# Patient Record
Sex: Female | Born: 1980 | Race: White | Hispanic: No | Smoking: Current every day smoker
Health system: Southern US, Community
[De-identification: ages and names within clinical notes are randomized; demographics above are authoritative.]

## PROBLEM LIST (undated history)

## (undated) ENCOUNTER — Inpatient Hospital Stay (HOSPITAL_COMMUNITY): Payer: Self-pay

## (undated) DIAGNOSIS — F419 Anxiety disorder, unspecified: Secondary | ICD-10-CM

## (undated) DIAGNOSIS — L309 Dermatitis, unspecified: Secondary | ICD-10-CM

## (undated) DIAGNOSIS — Z843 Family history of consanguinity: Secondary | ICD-10-CM

## (undated) DIAGNOSIS — I10 Essential (primary) hypertension: Secondary | ICD-10-CM

## (undated) DIAGNOSIS — R112 Nausea with vomiting, unspecified: Secondary | ICD-10-CM

## (undated) DIAGNOSIS — G43909 Migraine, unspecified, not intractable, without status migrainosus: Secondary | ICD-10-CM

## (undated) DIAGNOSIS — K219 Gastro-esophageal reflux disease without esophagitis: Secondary | ICD-10-CM

## (undated) DIAGNOSIS — F909 Attention-deficit hyperactivity disorder, unspecified type: Secondary | ICD-10-CM

## (undated) DIAGNOSIS — D649 Anemia, unspecified: Secondary | ICD-10-CM

## (undated) DIAGNOSIS — Z9889 Other specified postprocedural states: Secondary | ICD-10-CM

## (undated) HISTORY — DX: Essential (primary) hypertension: I10

## (undated) HISTORY — PX: SPINAL FUSION: SHX223

## (undated) HISTORY — PX: TONSILLECTOMY: SUR1361

## (undated) HISTORY — PX: OTHER SURGICAL HISTORY: SHX169

## (undated) HISTORY — DX: Attention-deficit hyperactivity disorder, unspecified type: F90.9

## (undated) HISTORY — DX: Migraine, unspecified, not intractable, without status migrainosus: G43.909

## (undated) HISTORY — DX: Family history of consanguinity: Z84.3

---
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
## 1998-01-03 ENCOUNTER — Ambulatory Visit (HOSPITAL_COMMUNITY): Admission: RE | Admit: 1998-01-03 | Discharge: 1998-01-03 | Payer: Self-pay | Admitting: Obstetrics and Gynecology

## 1998-04-10 ENCOUNTER — Ambulatory Visit (HOSPITAL_COMMUNITY): Admission: RE | Admit: 1998-04-10 | Discharge: 1998-04-10 | Payer: Self-pay | Admitting: Obstetrics and Gynecology

## 1998-04-10 ENCOUNTER — Encounter: Payer: Self-pay | Admitting: Obstetrics and Gynecology

## 1998-04-18 ENCOUNTER — Encounter: Payer: Self-pay | Admitting: Obstetrics and Gynecology

## 1998-04-18 ENCOUNTER — Ambulatory Visit (HOSPITAL_COMMUNITY): Admission: RE | Admit: 1998-04-18 | Discharge: 1998-04-18 | Payer: Self-pay | Admitting: Obstetrics and Gynecology

## 1998-04-25 ENCOUNTER — Observation Stay (HOSPITAL_COMMUNITY): Admission: AD | Admit: 1998-04-25 | Discharge: 1998-04-26 | Payer: Self-pay | Admitting: Obstetrics and Gynecology

## 1998-04-26 ENCOUNTER — Encounter: Payer: Self-pay | Admitting: Obstetrics and Gynecology

## 1998-05-11 ENCOUNTER — Ambulatory Visit (HOSPITAL_COMMUNITY): Admission: RE | Admit: 1998-05-11 | Discharge: 1998-05-11 | Payer: Self-pay | Admitting: Obstetrics and Gynecology

## 1998-07-17 ENCOUNTER — Inpatient Hospital Stay (HOSPITAL_COMMUNITY): Admission: AD | Admit: 1998-07-17 | Discharge: 1998-07-19 | Payer: Self-pay | Admitting: Obstetrics and Gynecology

## 1998-10-10 ENCOUNTER — Emergency Department (HOSPITAL_COMMUNITY): Admission: EM | Admit: 1998-10-10 | Discharge: 1998-10-10 | Payer: Self-pay | Admitting: Emergency Medicine

## 1998-11-27 ENCOUNTER — Emergency Department (HOSPITAL_COMMUNITY): Admission: EM | Admit: 1998-11-27 | Discharge: 1998-11-28 | Payer: Self-pay | Admitting: Emergency Medicine

## 1998-11-29 ENCOUNTER — Encounter: Admission: RE | Admit: 1998-11-29 | Discharge: 1998-11-29 | Payer: Self-pay | Admitting: Hematology and Oncology

## 1999-01-27 ENCOUNTER — Encounter: Payer: Self-pay | Admitting: Internal Medicine

## 1999-01-27 ENCOUNTER — Emergency Department (HOSPITAL_COMMUNITY): Admission: EM | Admit: 1999-01-27 | Discharge: 1999-01-27 | Payer: Self-pay | Admitting: Internal Medicine

## 1999-02-07 ENCOUNTER — Emergency Department (HOSPITAL_COMMUNITY): Admission: EM | Admit: 1999-02-07 | Discharge: 1999-02-07 | Payer: Self-pay | Admitting: Emergency Medicine

## 1999-02-08 ENCOUNTER — Other Ambulatory Visit: Admission: RE | Admit: 1999-02-08 | Discharge: 1999-02-08 | Payer: Self-pay | Admitting: *Deleted

## 1999-09-08 ENCOUNTER — Emergency Department (HOSPITAL_COMMUNITY): Admission: EM | Admit: 1999-09-08 | Discharge: 1999-09-09 | Payer: Self-pay

## 1999-11-13 ENCOUNTER — Emergency Department (HOSPITAL_COMMUNITY): Admission: EM | Admit: 1999-11-13 | Discharge: 1999-11-13 | Payer: Self-pay

## 2000-03-13 ENCOUNTER — Encounter: Payer: Self-pay | Admitting: Emergency Medicine

## 2000-03-13 ENCOUNTER — Emergency Department (HOSPITAL_COMMUNITY): Admission: EM | Admit: 2000-03-13 | Discharge: 2000-03-13 | Payer: Self-pay | Admitting: Emergency Medicine

## 2000-04-20 ENCOUNTER — Emergency Department (HOSPITAL_COMMUNITY): Admission: EM | Admit: 2000-04-20 | Discharge: 2000-04-20 | Payer: Self-pay | Admitting: Emergency Medicine

## 2000-04-23 ENCOUNTER — Emergency Department (HOSPITAL_COMMUNITY): Admission: EM | Admit: 2000-04-23 | Discharge: 2000-04-23 | Payer: Self-pay | Admitting: Emergency Medicine

## 2000-06-02 ENCOUNTER — Emergency Department (HOSPITAL_COMMUNITY): Admission: EM | Admit: 2000-06-02 | Discharge: 2000-06-02 | Payer: Self-pay | Admitting: Emergency Medicine

## 2001-01-14 ENCOUNTER — Other Ambulatory Visit: Admission: RE | Admit: 2001-01-14 | Discharge: 2001-01-14 | Payer: Self-pay | Admitting: Family Medicine

## 2001-08-17 ENCOUNTER — Encounter: Payer: Self-pay | Admitting: Emergency Medicine

## 2001-08-17 ENCOUNTER — Emergency Department (HOSPITAL_COMMUNITY): Admission: EM | Admit: 2001-08-17 | Discharge: 2001-08-17 | Payer: Self-pay | Admitting: Emergency Medicine

## 2001-09-13 ENCOUNTER — Emergency Department (HOSPITAL_COMMUNITY): Admission: EM | Admit: 2001-09-13 | Discharge: 2001-09-14 | Payer: Self-pay

## 2002-03-29 ENCOUNTER — Emergency Department (HOSPITAL_COMMUNITY): Admission: EM | Admit: 2002-03-29 | Discharge: 2002-03-29 | Payer: Self-pay | Admitting: Emergency Medicine

## 2002-03-30 ENCOUNTER — Emergency Department (HOSPITAL_COMMUNITY): Admission: EM | Admit: 2002-03-30 | Discharge: 2002-03-30 | Payer: Self-pay | Admitting: Emergency Medicine

## 2002-03-30 ENCOUNTER — Encounter: Payer: Self-pay | Admitting: Emergency Medicine

## 2002-05-03 ENCOUNTER — Other Ambulatory Visit: Admission: RE | Admit: 2002-05-03 | Discharge: 2002-05-03 | Payer: Self-pay | Admitting: Family Medicine

## 2002-08-06 ENCOUNTER — Emergency Department (HOSPITAL_COMMUNITY): Admission: EM | Admit: 2002-08-06 | Discharge: 2002-08-06 | Payer: Self-pay | Admitting: Emergency Medicine

## 2002-10-04 ENCOUNTER — Other Ambulatory Visit: Admission: RE | Admit: 2002-10-04 | Discharge: 2002-10-04 | Payer: Self-pay | Admitting: Obstetrics and Gynecology

## 2003-01-06 ENCOUNTER — Encounter: Admission: RE | Admit: 2003-01-06 | Discharge: 2003-01-06 | Payer: Self-pay | Admitting: Family Medicine

## 2003-01-06 ENCOUNTER — Encounter: Payer: Self-pay | Admitting: Family Medicine

## 2003-02-04 ENCOUNTER — Other Ambulatory Visit: Admission: RE | Admit: 2003-02-04 | Discharge: 2003-02-04 | Payer: Self-pay | Admitting: Obstetrics and Gynecology

## 2003-07-15 ENCOUNTER — Ambulatory Visit (HOSPITAL_COMMUNITY): Admission: RE | Admit: 2003-07-15 | Discharge: 2003-07-15 | Payer: Self-pay | Admitting: Otolaryngology

## 2003-07-15 ENCOUNTER — Encounter (INDEPENDENT_AMBULATORY_CARE_PROVIDER_SITE_OTHER): Payer: Self-pay | Admitting: Specialist

## 2003-07-15 ENCOUNTER — Ambulatory Visit (HOSPITAL_BASED_OUTPATIENT_CLINIC_OR_DEPARTMENT_OTHER): Admission: RE | Admit: 2003-07-15 | Discharge: 2003-07-15 | Payer: Self-pay | Admitting: Otolaryngology

## 2004-03-22 ENCOUNTER — Ambulatory Visit: Payer: Self-pay | Admitting: Family Medicine

## 2004-04-02 ENCOUNTER — Ambulatory Visit: Payer: Self-pay | Admitting: Family Medicine

## 2004-04-30 ENCOUNTER — Ambulatory Visit: Payer: Self-pay | Admitting: Family Medicine

## 2004-05-22 ENCOUNTER — Ambulatory Visit: Payer: Self-pay | Admitting: Family Medicine

## 2004-09-04 ENCOUNTER — Emergency Department (HOSPITAL_COMMUNITY): Admission: EM | Admit: 2004-09-04 | Discharge: 2004-09-04 | Payer: Self-pay | Admitting: Emergency Medicine

## 2004-09-07 ENCOUNTER — Inpatient Hospital Stay (HOSPITAL_COMMUNITY): Admission: RE | Admit: 2004-09-07 | Discharge: 2004-09-09 | Payer: Self-pay | Admitting: Neurosurgery

## 2004-09-26 ENCOUNTER — Encounter: Admission: RE | Admit: 2004-09-26 | Discharge: 2004-10-31 | Payer: Self-pay | Admitting: Neurosurgery

## 2005-02-19 ENCOUNTER — Ambulatory Visit: Payer: Self-pay | Admitting: Family Medicine

## 2005-02-27 ENCOUNTER — Ambulatory Visit: Payer: Self-pay | Admitting: Licensed Clinical Social Worker

## 2005-03-11 ENCOUNTER — Ambulatory Visit: Payer: Self-pay | Admitting: Licensed Clinical Social Worker

## 2005-03-12 ENCOUNTER — Other Ambulatory Visit: Admission: RE | Admit: 2005-03-12 | Discharge: 2005-03-12 | Payer: Self-pay | Admitting: Family Medicine

## 2005-03-12 ENCOUNTER — Ambulatory Visit: Payer: Self-pay | Admitting: Family Medicine

## 2005-03-12 LAB — CONVERTED CEMR LAB: Pap Smear: ABNORMAL

## 2005-03-14 ENCOUNTER — Encounter: Admission: RE | Admit: 2005-03-14 | Discharge: 2005-03-14 | Payer: Self-pay | Admitting: Neurosurgery

## 2005-03-18 ENCOUNTER — Ambulatory Visit: Payer: Self-pay | Admitting: Licensed Clinical Social Worker

## 2005-03-25 ENCOUNTER — Ambulatory Visit: Payer: Self-pay | Admitting: Licensed Clinical Social Worker

## 2005-04-08 ENCOUNTER — Ambulatory Visit: Payer: Self-pay | Admitting: Licensed Clinical Social Worker

## 2005-05-24 ENCOUNTER — Other Ambulatory Visit: Admission: RE | Admit: 2005-05-24 | Discharge: 2005-05-24 | Payer: Self-pay | Admitting: Obstetrics and Gynecology

## 2005-06-27 ENCOUNTER — Encounter: Admission: RE | Admit: 2005-06-27 | Discharge: 2005-06-27 | Payer: Self-pay | Admitting: Neurosurgery

## 2005-09-07 ENCOUNTER — Ambulatory Visit: Payer: Self-pay | Admitting: Family Medicine

## 2005-09-09 ENCOUNTER — Ambulatory Visit: Payer: Self-pay | Admitting: Family Medicine

## 2005-10-09 ENCOUNTER — Ambulatory Visit: Payer: Self-pay | Admitting: Family Medicine

## 2005-10-24 ENCOUNTER — Ambulatory Visit: Payer: Self-pay | Admitting: Family Medicine

## 2005-10-25 ENCOUNTER — Ambulatory Visit: Payer: Self-pay | Admitting: Internal Medicine

## 2005-10-28 ENCOUNTER — Ambulatory Visit (HOSPITAL_COMMUNITY): Admission: RE | Admit: 2005-10-28 | Discharge: 2005-10-28 | Payer: Self-pay | Admitting: Internal Medicine

## 2006-02-13 ENCOUNTER — Ambulatory Visit: Payer: Self-pay | Admitting: Family Medicine

## 2006-04-04 ENCOUNTER — Ambulatory Visit: Payer: Self-pay | Admitting: Family Medicine

## 2006-06-12 ENCOUNTER — Ambulatory Visit: Payer: Self-pay | Admitting: Family Medicine

## 2006-07-11 ENCOUNTER — Ambulatory Visit: Payer: Self-pay | Admitting: Family Medicine

## 2006-07-28 ENCOUNTER — Ambulatory Visit: Payer: Self-pay | Admitting: Family Medicine

## 2006-09-24 ENCOUNTER — Ambulatory Visit: Payer: Self-pay | Admitting: Family Medicine

## 2006-09-24 ENCOUNTER — Encounter: Payer: Self-pay | Admitting: Family Medicine

## 2006-09-24 DIAGNOSIS — F4001 Agoraphobia with panic disorder: Secondary | ICD-10-CM

## 2006-09-24 DIAGNOSIS — G43909 Migraine, unspecified, not intractable, without status migrainosus: Secondary | ICD-10-CM

## 2006-11-11 ENCOUNTER — Ambulatory Visit: Payer: Self-pay | Admitting: Family Medicine

## 2007-01-20 ENCOUNTER — Ambulatory Visit: Payer: Self-pay | Admitting: Family Medicine

## 2007-01-20 DIAGNOSIS — N390 Urinary tract infection, site not specified: Secondary | ICD-10-CM

## 2007-01-20 LAB — CONVERTED CEMR LAB
Ketones, urine, test strip: NEGATIVE
Specific Gravity, Urine: >=1.03
Urobilinogen, UA: 0.2
WBC Urine, dipstick: NEGATIVE

## 2007-03-12 ENCOUNTER — Ambulatory Visit: Payer: Self-pay | Admitting: Family Medicine

## 2007-04-13 ENCOUNTER — Ambulatory Visit: Payer: Self-pay | Admitting: Family Medicine

## 2007-07-31 ENCOUNTER — Ambulatory Visit: Payer: Self-pay | Admitting: Family Medicine

## 2007-07-31 DIAGNOSIS — J45909 Unspecified asthma, uncomplicated: Secondary | ICD-10-CM

## 2007-07-31 DIAGNOSIS — J019 Acute sinusitis, unspecified: Secondary | ICD-10-CM | POA: Insufficient documentation

## 2007-09-15 ENCOUNTER — Ambulatory Visit: Payer: Self-pay | Admitting: Family Medicine

## 2007-09-15 DIAGNOSIS — J029 Acute pharyngitis, unspecified: Secondary | ICD-10-CM

## 2008-01-27 ENCOUNTER — Telehealth: Payer: Self-pay | Admitting: Family Medicine

## 2008-03-10 ENCOUNTER — Ambulatory Visit: Payer: Self-pay | Admitting: Family Medicine

## 2008-03-25 ENCOUNTER — Ambulatory Visit: Payer: Self-pay | Admitting: Family Medicine

## 2008-04-12 ENCOUNTER — Telehealth: Payer: Self-pay | Admitting: Family Medicine

## 2008-04-29 ENCOUNTER — Ambulatory Visit: Payer: Self-pay | Admitting: Family Medicine

## 2008-05-30 ENCOUNTER — Telehealth: Payer: Self-pay | Admitting: Family Medicine

## 2008-08-26 ENCOUNTER — Ambulatory Visit: Payer: Self-pay | Admitting: Family Medicine

## 2008-08-26 DIAGNOSIS — M25569 Pain in unspecified knee: Secondary | ICD-10-CM | POA: Insufficient documentation

## 2008-10-25 ENCOUNTER — Telehealth (INDEPENDENT_AMBULATORY_CARE_PROVIDER_SITE_OTHER): Payer: Self-pay | Admitting: *Deleted

## 2008-11-23 ENCOUNTER — Telehealth (INDEPENDENT_AMBULATORY_CARE_PROVIDER_SITE_OTHER): Payer: Self-pay | Admitting: *Deleted

## 2009-01-12 ENCOUNTER — Ambulatory Visit: Payer: Self-pay | Admitting: Family Medicine

## 2009-01-12 DIAGNOSIS — R110 Nausea: Secondary | ICD-10-CM

## 2009-01-12 DIAGNOSIS — K219 Gastro-esophageal reflux disease without esophagitis: Secondary | ICD-10-CM | POA: Insufficient documentation

## 2009-01-12 LAB — CONVERTED CEMR LAB
Beta hcg, urine, semiquantitative: NEGATIVE
Bilirubin Urine: NEGATIVE
Glucose, Urine, Semiquant: NEGATIVE
Specific Gravity, Urine: 1.01
pH: 7

## 2009-01-26 ENCOUNTER — Telehealth (INDEPENDENT_AMBULATORY_CARE_PROVIDER_SITE_OTHER): Payer: Self-pay | Admitting: *Deleted

## 2009-02-02 ENCOUNTER — Ambulatory Visit: Payer: Self-pay | Admitting: Internal Medicine

## 2009-02-09 ENCOUNTER — Telehealth: Payer: Self-pay | Admitting: Family Medicine

## 2009-05-08 ENCOUNTER — Inpatient Hospital Stay (HOSPITAL_COMMUNITY): Admission: AD | Admit: 2009-05-08 | Discharge: 2009-05-08 | Payer: Self-pay | Admitting: Obstetrics and Gynecology

## 2009-05-10 ENCOUNTER — Inpatient Hospital Stay (HOSPITAL_COMMUNITY): Admission: AD | Admit: 2009-05-10 | Discharge: 2009-05-10 | Payer: Self-pay | Admitting: Obstetrics & Gynecology

## 2009-07-12 ENCOUNTER — Ambulatory Visit: Payer: Self-pay | Admitting: Family Medicine

## 2009-07-12 DIAGNOSIS — J069 Acute upper respiratory infection, unspecified: Secondary | ICD-10-CM | POA: Insufficient documentation

## 2009-07-14 ENCOUNTER — Encounter: Payer: Self-pay | Admitting: Family Medicine

## 2010-01-23 ENCOUNTER — Inpatient Hospital Stay (HOSPITAL_COMMUNITY): Admission: AD | Admit: 2010-01-23 | Discharge: 2010-01-23 | Payer: Self-pay | Admitting: Obstetrics and Gynecology

## 2010-05-08 ENCOUNTER — Inpatient Hospital Stay (HOSPITAL_COMMUNITY)
Admission: AD | Admit: 2010-05-08 | Discharge: 2010-05-08 | Payer: Self-pay | Source: Home / Self Care | Attending: Obstetrics and Gynecology | Admitting: Obstetrics and Gynecology

## 2010-05-10 ENCOUNTER — Inpatient Hospital Stay (HOSPITAL_COMMUNITY)
Admission: AD | Admit: 2010-05-10 | Discharge: 2010-05-12 | Payer: Self-pay | Source: Home / Self Care | Attending: Obstetrics and Gynecology | Admitting: Obstetrics and Gynecology

## 2010-06-21 NOTE — Medication Information (Signed)
Summary: Coverage Approval for Aciphex  Coverage Approval for Aciphex   Imported By: Maryln Gottron 07/18/2009 13:29:29  _____________________________________________________________________  External Attachment:    Type:   Image     Comment:   External Document

## 2010-06-21 NOTE — Assessment & Plan Note (Signed)
Summary: sinuses/cm   Vital Signs:  Patient profile:   30 year old female Weight:      192 pounds O2 Sat:      98 % on Room air Temp:     98.4 degrees F oral Pulse rate:   107 / minute BP sitting:   104 / 76  (left arm) Cuff size:   large  Vitals Entered By: Alfred Levins, CMA (July 12, 2009 2:17 PM)  O2 Flow:  Room air CC: sinus inf?, watery eyes, acid reflux   History of Present Illness: Here with 3 days of a stuffy head, PND, ST, and a dry cough. No fever. Also Omeprazole does not work for her GERD, so she would like to go back on Aciphex.   Current Medications (verified): 1)  Proventil Hfa 108 (90 Base) Mcg/act Aers (Albuterol Sulfate) .... Inhale 2 Puffs 4 Times A Day 2)  Claritin 10 Mg Tabs (Loratadine) .Marland Kitchen.. 1 By Mouth Once Daily 3)  Maxalt 10 Mg Tabs (Rizatriptan Benzoate) .... Take 1 Tablet By Mouth Once A Day 4)  Valium 5 Mg Tabs (Diazepam) .Marland Kitchen.. 1 Tablet By Mouth Twice A Day 5)  Ketoconazole 2 %  Crea (Ketoconazole) .... Apply Three Times A Day As Needed For Rash  Allergies (verified): 1)  ! Zithromax 2)  Penicillin V Potassium (Penicillin V Potassium)  Past History:  Past Medical History: Reviewed history from 01/12/2009 and no changes required. Anxiety Asthma GERD  Review of Systems  The patient denies anorexia, fever, weight loss, weight gain, vision loss, decreased hearing, hoarseness, chest pain, syncope, dyspnea on exertion, peripheral edema, hemoptysis, abdominal pain, melena, hematochezia, severe indigestion/heartburn, hematuria, incontinence, genital sores, muscle weakness, suspicious skin lesions, transient blindness, difficulty walking, depression, unusual weight change, abnormal bleeding, enlarged lymph nodes, angioedema, breast masses, and testicular masses.    Physical Exam  General:  Well-developed,well-nourished,in no acute distress; alert,appropriate and cooperative throughout examination Head:  Normocephalic and atraumatic without obvious  abnormalities. No apparent alopecia or balding. Eyes:  No corneal or conjunctival inflammation noted. EOMI. Perrla. Funduscopic exam benign, without hemorrhages, exudates or papilledema. Vision grossly normal. Ears:  External ear exam shows no significant lesions or deformities.  Otoscopic examination reveals clear canals, tympanic membranes are intact bilaterally without bulging, retraction, inflammation or discharge. Hearing is grossly normal bilaterally. Nose:  External nasal examination shows no deformity or inflammation. Nasal mucosa are pink and moist without lesions or exudates. Mouth:  Oral mucosa and oropharynx without lesions or exudates.  Teeth in good repair. Neck:  No deformities, masses, or tenderness noted. Lungs:  Normal respiratory effort, chest expands symmetrically. Lungs are clear to auscultation, no crackles or wheezes. Heart:  Normal rate and regular rhythm. S1 and S2 normal without gallop, murmur, click, rub or other extra sounds.   Impression & Recommendations:  Problem # 1:  VIRAL URI (ICD-465.9)  The following medications were removed from the medication list:    Diclofenac Sodium 50 Mg Tbec (Diclofenac sodium) .Marland Kitchen... Three times a day    Hydrocodone-homatropine 5-1.5 Mg/82ml Syrp (Hydrocodone-homatropine) .Marland Kitchen... 1 teaspoon every 6 hours as needed for cough Her updated medication list for this problem includes:    Claritin 10 Mg Tabs (Loratadine) .Marland Kitchen... 1 by mouth once daily  Problem # 2:  GERD (ICD-530.81)  The following medications were removed from the medication list:    Omeprazole 40 Mg Cpdr (Omeprazole) ..... Once daily Her updated medication list for this problem includes:    Aciphex 20 Mg Tbec (Rabeprazole sodium) .Marland KitchenMarland KitchenMarland KitchenMarland Kitchen  Once daily  Complete Medication List: 1)  Proventil Hfa 108 (90 Base) Mcg/act Aers (Albuterol sulfate) .... Inhale 2 puffs 4 times a day 2)  Claritin 10 Mg Tabs (Loratadine) .Marland Kitchen.. 1 by mouth once daily 3)  Maxalt 10 Mg Tabs (Rizatriptan  benzoate) .... Take 1 tablet by mouth once a day 4)  Valium 5 Mg Tabs (Diazepam) .Marland Kitchen.. 1 tablet by mouth twice a day 5)  Ketoconazole 2 % Crea (Ketoconazole) .... Apply three times a day as needed for rash 6)  Alaway 0.025 % Soln (Ketotifen fumarate) .... As needed 7)  Aciphex 20 Mg Tbec (Rabeprazole sodium) .... Once daily  Patient Instructions: 1)  Get back on Aciphex. Drink fluids, rest. 2)  Please schedule a follow-up appointment as needed .  Prescriptions: KETOCONAZOLE 2 %  CREA (KETOCONAZOLE) apply three times a day as needed for rash  #60 x 5   Entered and Authorized by:   Nelwyn Salisbury MD   Signed by:   Nelwyn Salisbury MD on 07/12/2009   Method used:   Electronically to        [facility]  [phone]* (retail)       [address] [place], [place]  [address]       Ph: [phone] or [phone]       Fax: [phone]   RxID:   [id number] ACIPHEX 20 MG TBEC (RABEPRAZOLE SODIUM) once daily  #30 x 11   Entered and Authorized by:   Nelwyn Salisbury MD   Signed by:   Nelwyn Salisbury MD on 07/12/2009   Method used:   Electronically to        [facility]  [phone]* (retail)       [address] [place], [place]  [address]       Ph: [phone] or [phone]       Fax: [phone]   RxID:   [id number]

## 2010-07-30 LAB — CBC
HCT: 32.3 % — ABNORMAL LOW (ref 36.0–46.0)
HCT: 33.7 % — ABNORMAL LOW (ref 36.0–46.0)
HCT: 34.7 % — ABNORMAL LOW (ref 36.0–46.0)
HCT: 35.6 % — ABNORMAL LOW (ref 36.0–46.0)
MCH: 28.3 pg (ref 26.0–34.0)
MCH: 28.3 pg (ref 26.0–34.0)
MCHC: 32.2 g/dL (ref 30.0–36.0)
MCHC: 32.6 g/dL (ref 30.0–36.0)
MCHC: 33.7 g/dL (ref 30.0–36.0)
MCV: 85.3 fL (ref 78.0–100.0)
MCV: 86.2 fL (ref 78.0–100.0)
MCV: 88 fL (ref 78.0–100.0)
Platelets: 149 10*3/uL — ABNORMAL LOW (ref 150–400)
RBC: 3.67 MIL/uL — ABNORMAL LOW (ref 3.87–5.11)
RBC: 3.91 MIL/uL (ref 3.87–5.11)
RBC: 4.07 MIL/uL (ref 3.87–5.11)
RBC: 4.2 MIL/uL (ref 3.87–5.11)
RDW: 14.4 % (ref 11.5–15.5)
RDW: 14.8 % (ref 11.5–15.5)
WBC: 15.9 10*3/uL — ABNORMAL HIGH (ref 4.0–10.5)
WBC: 22.5 10*3/uL — ABNORMAL HIGH (ref 4.0–10.5)

## 2010-07-30 LAB — DIFFERENTIAL
Basophils Absolute: 0 10*3/uL (ref 0.0–0.1)
Basophils Relative: 0 % (ref 0–1)
Basophils Relative: 0 % (ref 0–1)
Eosinophils Absolute: 0.1 10*3/uL (ref 0.0–0.7)
Eosinophils Relative: 1 % (ref 0–5)
Lymphocytes Relative: 15 % (ref 12–46)
Monocytes Relative: 7 % (ref 3–12)
Neutro Abs: 10.7 10*3/uL — ABNORMAL HIGH (ref 1.7–7.7)
Neutrophils Relative %: 77 % (ref 43–77)

## 2010-07-30 LAB — COMPREHENSIVE METABOLIC PANEL
ALT: 14 U/L (ref 0–35)
AST: 21 U/L (ref 0–37)
Albumin: 2.7 g/dL — ABNORMAL LOW (ref 3.5–5.2)
Albumin: 2.8 g/dL — ABNORMAL LOW (ref 3.5–5.2)
Alkaline Phosphatase: 185 U/L — ABNORMAL HIGH (ref 39–117)
CO2: 20 mEq/L (ref 19–32)
Calcium: 9.2 mg/dL (ref 8.4–10.5)
Chloride: 103 mEq/L (ref 96–112)
GFR calc Af Amer: >60 mL/min (ref >60)
GFR calc Af Amer: >60 mL/min (ref >60)
Glucose, Bld: 78 mg/dL (ref 70–99)
Sodium: 136 mEq/L (ref 135–145)
Total Bilirubin: 0.2 mg/dL — ABNORMAL LOW (ref 0.3–1.2)
Total Bilirubin: 0.4 mg/dL (ref 0.3–1.2)
Total Protein: 6.4 g/dL (ref 6.0–8.3)

## 2010-07-30 LAB — URIC ACID
Uric Acid, Serum: 6.1 mg/dL (ref 2.4–7.0)
Uric Acid, Serum: 6.1 mg/dL (ref 2.4–7.0)

## 2010-07-30 LAB — URINE MICROSCOPIC-ADD ON

## 2010-07-30 LAB — URINALYSIS, ROUTINE W REFLEX MICROSCOPIC: Nitrite: NEGATIVE

## 2010-07-30 LAB — RAPID HIV SCREEN (WH-MAU): Rapid HIV Screen: NONREACTIVE

## 2010-07-30 LAB — RPR: RPR Ser Ql: NONREACTIVE

## 2010-08-02 LAB — URINALYSIS, ROUTINE W REFLEX MICROSCOPIC
Bilirubin Urine: NEGATIVE
Nitrite: NEGATIVE
Specific Gravity, Urine: >1.03 — ABNORMAL HIGH (ref 1.005–1.030)
Urobilinogen, UA: 0.2 mg/dL (ref 0.0–1.0)
pH: 6 (ref 5.0–8.0)

## 2010-08-02 LAB — URINE MICROSCOPIC-ADD ON

## 2010-08-20 LAB — URINALYSIS, ROUTINE W REFLEX MICROSCOPIC
Glucose, UA: NEGATIVE mg/dL
Ketones, ur: NEGATIVE mg/dL
Nitrite: NEGATIVE
Protein, ur: NEGATIVE mg/dL
Specific Gravity, Urine: 1.02 (ref 1.005–1.030)
Urobilinogen, UA: 0.2 mg/dL (ref 0.0–1.0)
pH: 5.5 (ref 5.0–8.0)

## 2010-08-20 LAB — URINE MICROSCOPIC-ADD ON

## 2010-08-20 LAB — HCG, QUANTITATIVE, PREGNANCY
hCG, Beta Chain, Quant, S: 105 m[IU]/mL — ABNORMAL HIGH (ref <5)
hCG, Beta Chain, Quant, S: 13 m[IU]/mL — ABNORMAL HIGH (ref <5)

## 2010-08-20 LAB — CBC
MCHC: 33.2 g/dL (ref 30.0–36.0)
RBC: 4.38 MIL/uL (ref 3.87–5.11)
WBC: 18.1 10*3/uL — ABNORMAL HIGH (ref 4.0–10.5)

## 2010-08-20 LAB — GC/CHLAMYDIA PROBE AMP, GENITAL: Chlamydia, DNA Probe: NEGATIVE

## 2010-08-20 LAB — ABO/RH: ABO/RH(D): O POS

## 2010-08-20 LAB — WET PREP, GENITAL: Yeast Wet Prep HPF POC: NONE SEEN

## 2010-09-28 ENCOUNTER — Ambulatory Visit (INDEPENDENT_AMBULATORY_CARE_PROVIDER_SITE_OTHER): Payer: Self-pay | Admitting: Family Medicine

## 2010-09-28 ENCOUNTER — Encounter: Payer: Self-pay | Admitting: Family Medicine

## 2010-09-28 VITALS — BP 118/82 | Temp 98.6°F | Wt 223.0 lb

## 2010-09-28 DIAGNOSIS — H109 Unspecified conjunctivitis: Secondary | ICD-10-CM

## 2010-09-28 MED ORDER — KETOROLAC TROMETHAMINE 0.5 % OP SOLN: 1.0000 [drp] | Freq: Four times a day (QID) | OPHTHALMIC | Status: AC

## 2010-09-28 NOTE — Progress Notes (Signed)
  Subjective:    Patient ID: Diana Nelson, female    DOB: [birth year], 30 y.o.   MRN: [id number]  HPI Here to discuss redness, burning, and tearing in the right eye which has bothered her for one year. The left eye has never been affected. She does not wear contacts. No blurriness of vision, no eye pain. She has tried OTC allergy drops and moisturizer drops to no avail. She recently saw an optometrist who told her it was "allergies" and gave her an antihistamine drop, which also has not helped.   Review of Systems  Constitutional: Negative.   HENT: Negative for congestion, rhinorrhea, sneezing, postnasal drip and sinus pressure.   Eyes: Positive for redness and itching. Negative for photophobia, pain, discharge and visual disturbance.  Respiratory: Negative.   Skin: Negative.        Objective:   Physical Exam  Constitutional: She appears well-developed and well-nourished.  HENT:  Right Ear: External ear normal.  Left Ear: External ear normal.  Nose: Nose normal.  Mouth/Throat: Oropharynx is clear and moist. No oropharyngeal exudate.  Eyes: EOM are normal. Pupils are equal, round, and reactive to light. Right eye exhibits no discharge. Left eye exhibits no discharge.       The left eye is completely clear. The right conjunctiva is red with some swelling of the insides of the lids. No photophobia. The cornea appears clear   Pulmonary/Chest: Effort normal and breath sounds normal.  Lymphadenopathy:    She has no cervical adenopathy.          Assessment & Plan:  The eye has some type of inflammatory process that has gone on for a year and which has not involved the left eye. This is definitely not simple allergies. We will try Acular drops. She needs to see an Ophthalmologist ASAP.

## 2010-10-05 NOTE — Op Note (Signed)
NAME:  Diana Nelson, Diana Nelson                         ACCOUNT NO.:  [id number]   MEDICAL RECORD NO.:  [id number]                   PATIENT TYPE:  AMB   LOCATION:  DSC                                  FACILITY:  [facility]   PHYSICIAN:  Lucky Cowboy, M.D.                    DATE OF BIRTH:  [birth year]   DATE OF PROCEDURE:  07/15/2003  DATE OF DISCHARGE:                                 OPERATIVE REPORT   PREOPERATIVE DIAGNOSIS:  Chronic tonsillitis.   POSTOPERATIVE DIAGNOSIS:  Chronic tonsillitis.   PROCEDURE:  Tonsillectomy.   SURGEON:  Lucky Cowboy, M.D.   ANESTHESIA:  General.   ESTIMATED BLOOD LOSS:  Less than 20 mL.   SPECIMENS:  Tonsils.   COMPLICATIONS:  None.   INDICATIONS FOR PROCEDURE:  This patient is a 30 year old female who has had  chronic sore throats for the past two years.  They have been increasing over  this current winter.  Antibiotics do not help the sore throat very much.  The tonsils do swell considerably when infected.  She is having to go to the  doctors office 10-12 times per year for tonsil infections.  For these  reasons, tonsillectomy is performed.   FINDINGS:  The patient was noted to have a markedly fibrotic left tonsil  scarred to the pharyngeal wall.  There was chronic tonsillar debris  bilaterally.   PROCEDURE:  The patient was taken to the operating room and placed on the  table in supine position.  She was then placed under general endotracheal  anesthesia and the table rotated counter clockwise 90 degrees.  The neck was  gently extended using a shoulder roll.  The head and body were draped in the  usual fashion.  The Crowe-Davis mouth gag with a #3 tongue blade was then  placed intraorally, opened, and suspended on the Mayo stand.  The right  palatine tonsil was grasped with Allis clamps and directed inferomedially.  The Harmonic scalpel was then used to resect the tonsil staying within the  peritonsillar space adjacent to the tonsillar capsule.  The  left palatine  tonsil was removed in an identical fashion.  An NG tube was placed down the  esophagus for suctioning of the gastric contents.  The mouth gag was removed noting no damage to the teeth or soft tissues.  The table was rotated clockwise 90 degrees to its original position.  The  patient was awakened from anesthesia and taken to the post anesthesia unit  in stable condition.  There were no complications.                                               Lucky Cowboy, M.D.    SJ/MEDQ  D:  07/15/2003  T:  07/15/2003  Job:  [id number]   cc:   Tera Mater. Clent Ridges, M.D. [facility]

## 2010-10-05 NOTE — Letter (Signed)
Sep 24, 2006     RE:  [name], [name]  MRN:  [id number]  /  DOB:  1980/11/18   To Whom it May Concern:   This letter is concerning a patient of mine by the name of Diana Nelson  (date of birth 01-12-1981).  I have served as [name]'s primary care  physician since I met her in July of 2002.  Among the serious medical  problems we have been treating are chronic low back pain, which finally  necessitated a surgery on September 07, 2004.  She also has severe and  ongoing problems with anxiety and panic attacks.  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  This  letter should state that she does have ongoing medical difficulties of  this nature, and we are continuing to address them.  Please allow her to  continue her classes with no financial penalties due to these difficult  times.  If I may be of further assistance, please let me know.     Sincerely,      Tera Mater. Clent Ridges M.D.    Sincerely,      Tera Mater. Clent Ridges, MD  Electronically Signed    SAF/MedQ  DD: 09/24/2006  DT: 09/24/2006  Job #: [id number]

## 2010-10-05 NOTE — Op Note (Signed)
NAMEKHALIE, WINCE NO.:  [id number]   MEDICAL RECORD NO.:  [id number]          PATIENT TYPE:  INP   LOCATION:  2899                         FACILITY:  [facility]   PHYSICIAN:  Donalee Citrin, M.D.        DATE OF BIRTH:  1980-08-08   DATE OF PROCEDURE:  09/07/2004  DATE OF DISCHARGE:                                 OPERATIVE REPORT   PREOPERATIVE DIAGNOSIS:  Grade I spondylolisthesis L5-S1 with degenerative  disc disease L5-S1, bilateral L5 radiculopathies, right greater than left.   PROCEDURE:  Gill decompression L5-S1.  Posterior lumbar interbody fusion L5-  S1.  Pedicle screw fixation L5-S1.  The posterior lumbar interbody fusion  was done with tangent 10 x 26 tension allograft wedges.  Pedicle screw  fixation using the 5.5 Legacy pedicle screw system.  L5-S1 posterolateral  arthrodesis using locally harvested autograft L5-S1, open reduction spinal  deformity L5-S1.   SURGEON:  Donalee Citrin, M.D.   Threasa HeadsYetta Barre.   ANESTHESIA:  General endotracheal anesthesia.   HISTORY OF PRESENT ILLNESS:  Patient is a very pleasant 30 year old female  who has had longstanding back and right greater than left leg pain radiating  down to the top of the foot and big toe consistent with L5 nerve root  distribution.  Preoperative imaging showed a grade I spondylolisthesis with  degenerative disc disease and superior foraminal stenosis as well as  movement on dynamic films with flexion/extension and bilateral pars defect  on the MRI at L5.  Patient failed all forms of conservative treatment with  progressive worsening of back greater than leg pain.  Due to the patient's  spinal instability L5-S1 due to pars defect, patient is recommended  decompression stabilization procedure.  Discussed risks and benefits of  surgery with her.  She understands and agrees to go forward.   Patient brought to the OR where she was induced with general anesthesia,  positioned prone on the Wilson  frame.  Back prepped and draped in sterile  fashion.  Preoperative x-ray localized the L5-S1 disc space.  After  infiltration with 10 mL of lidocaine with epinephrine, a midline incision  was made and Bovie electrocautery was used to take down subcutaneous tissue  and subperiosteal dissection carried out at lamina of L4-5 and S1.  Pedicles  of L5 and S1 were identified, confirmed with fluoroscopy.  The laminar  complex at L5 was noted to be hypermobile and with complete pars defects at  L5.  This was all removed in piecemeal fashion with Leksell rongeur and 3  and 4 mm Kerrison punch.  Then ligamentum flavum which was noted to be  severely hypertrophied along the medial aspect of facet complex.  Fossa  underbitten.  The L5 nerve root was identified and the pedicle of L5 was  identified and the L5 nerve root was decompressed, tugging the pedicle  distally out the foramen.  This was all underbitten underneath the facet  complex and around the L5 pedicle making the facetectomy complete _________  pedicles.  then the S1 nerve root was identified, decompressed at its  foramen, flush with the pedicle.  The lateral aspect of the facet complex  was under bitten exposing the lateral aspect of the interspace.  A  tremendous amount of epineural veins which actually formed the cicatrix  around the L5 nerve roots were identified, dissected free and coagulated and  divided.  This further freed up the L5 nerve roots which were noted to be  compressed over the disc space due to the slip and overriding the L5-S1 disc  space.  After the interspace was identified, the __________ nerve root  retractor was used to reflect the right S1 nerve root medially.  __________  was made with 11 blade scalpel, pituitary rongeurs used to enter the disc  space.  A size 10 distractor was inserted, keeping the 4 Penfield displaced  in the L5 nerve root superiorly as well as the S1 nerve root medially.  Then  distractor had  good apposition at the end plates at W1-X9 __________ grafts  were opened.  Then on the left side, repeated cleaning of the disc space out  with chisel used to prepare the end plates.  Several large _________ removed  from central compartment as well as Amplatz scraped down creating good bony  apposition for the allograft, then the allograft was inserted using  fluoroscopy to confirm depth and structure.  Then the right side procedure  repeated locally, harvested allograft was packed against the left side  allograft prior to right side allograft insertion.  After both allografts  were inserted, fluoroscopy confirmed good positioning.  Then attention taken  to pedicle screw placement.  Pallet holes were drilled in lateral aspect of  pedicle at L5 using bony landmarks both from within the canal as well as  outside the canal-facet complex.  Medial _________ was palpated.  Pilot hole  was drilled.  It was cannulated with the awl probe, tapped with a _________  tap, probed again and _________ pedicle screw inserted with both L5 pedicle  screws.  At S1 6 x 35 screws were inserted.  Again in a similar fashion,  pedicles were noted to be completely competent both within the pedicle as  well as in the canal forming a medial lateral breech and all screws had  excellent purchase.  After all four pedicle screws placed, wound was  copiously irrigated.  Meticulous hemostasis was maintained.  Aggressive  decortication was carried out _________ L5 and lateral aspect of facet  complex at L5-S1. Then the remainder of the locally harvested allograft was  packed in the lateral gutters.  The insertion of the allografts reduced the  foot by about 50% and opened up the 5 foramen.  Then 30 mm rod was inserted  on the right with 40 on the left.  _________ S1.  The L5 pedicle screws  __________.  There was not room to apply a cross link.  Then after  compressing the neural foramen, we reexplored and noted to be  widely patent. Gelfoam was layed at the top of the dura.  The muscle fascia was  reapproximated with 0 interrupted Vicryl after a medium Hemovac drain was  placed.  Subcutaneous tissue closed with 2-0 interrupted Vicryl and the skin  was closed with running 4-0 subcuticular.  Benzoin and Steri-Strips applied.  Patient went to the recovery room in stable condition.  At the end of the  case, sponge and needle counts were correct.      GC/MEDQ  D:  09/07/2004  T:  09/07/2004  Job:  [id number]

## 2011-04-17 ENCOUNTER — Encounter: Payer: Self-pay | Admitting: Family Medicine

## 2011-04-17 ENCOUNTER — Ambulatory Visit (INDEPENDENT_AMBULATORY_CARE_PROVIDER_SITE_OTHER): Payer: Self-pay | Admitting: Family Medicine

## 2011-04-17 VITALS — BP 110/74 | HR 121 | Temp 98.4°F | Wt 216.0 lb

## 2011-04-17 DIAGNOSIS — J329 Chronic sinusitis, unspecified: Secondary | ICD-10-CM

## 2011-04-17 DIAGNOSIS — J029 Acute pharyngitis, unspecified: Secondary | ICD-10-CM

## 2011-04-17 MED ORDER — AMOXICILLIN 875 MG PO TABS: 875.0000 mg | ORAL_TABLET | Freq: Two times a day (BID) | ORAL | Status: DC

## 2011-04-17 NOTE — Progress Notes (Signed)
  Subjective:    Patient ID: Diana Nelson, female    DOB: 1980-06-26, 30 y.o.   MRN: [id number]  HPI Here for 2 weeks of sinus pressure, HA, ST, and a dry cough.   Review of Systems  Constitutional: Negative.   HENT: Positive for congestion, postnasal drip and sinus pressure.   Eyes: Negative.   Respiratory: Positive for cough.        Objective:   Physical Exam  Constitutional: She appears well-developed and well-nourished.  HENT:  Right Ear: External ear normal.  Left Ear: External ear normal.  Nose: Nose normal.  Mouth/Throat: Oropharynx is clear and moist. No oropharyngeal exudate.  Eyes: Conjunctivae are normal.  Neck: No thyromegaly present.  Pulmonary/Chest: Effort normal and breath sounds normal.  Lymphadenopathy:    She has no cervical adenopathy.          Assessment & Plan:  Add Mucinex prn

## 2011-05-06 ENCOUNTER — Telehealth: Payer: Self-pay | Admitting: Family Medicine

## 2011-05-06 NOTE — Telephone Encounter (Signed)
Pt believes she has pink eye pt was offered an 11:00 today but pt needed to be seen after 1:00 today. Pt also requesting if she can not be seen today to have an rx called in

## 2011-05-06 NOTE — Telephone Encounter (Signed)
Call in Cortisporin eye drops to use 2 drops q 4 hours prn, 10 ml with no rf

## 2011-05-07 MED ORDER — NEOMYCIN-POLYMYXIN-HC OP SUSP: 2.0000 [drp] | OPHTHALMIC | Status: DC | PRN

## 2011-05-07 NOTE — Telephone Encounter (Signed)
Script sent e-scribe, tried to call pt and no answer.

## 2011-06-13 ENCOUNTER — Encounter: Payer: Self-pay | Admitting: Family

## 2011-06-13 ENCOUNTER — Ambulatory Visit (INDEPENDENT_AMBULATORY_CARE_PROVIDER_SITE_OTHER): Payer: Self-pay | Admitting: Family

## 2011-06-13 VITALS — BP 116/78 | HR 109 | Temp 98.4°F | Wt 215.0 lb

## 2011-06-13 DIAGNOSIS — J0100 Acute maxillary sinusitis, unspecified: Secondary | ICD-10-CM

## 2011-06-13 DIAGNOSIS — J301 Allergic rhinitis due to pollen: Secondary | ICD-10-CM

## 2011-06-13 DIAGNOSIS — R05 Cough: Secondary | ICD-10-CM

## 2011-06-13 MED ORDER — AMOXICILLIN 500 MG PO TABS: 1000.0000 mg | ORAL_TABLET | Freq: Two times a day (BID) | ORAL | Status: DC

## 2011-06-13 MED ORDER — AMOXICILLIN 500 MG PO TABS: 1000.0000 mg | ORAL_TABLET | Freq: Two times a day (BID) | ORAL | Status: AC

## 2011-06-13 NOTE — Patient Instructions (Signed)

## 2011-06-14 NOTE — Progress Notes (Signed)
  Subjective:    Patient ID: Diana Nelson, female    DOB: Sep 07, 1980, 31 y.o.   MRN: [id number]  HPI 31 year old white female, 1 pack per day smoker, patient of Dr. Claris Che and with complaints of sneezing, cough, congestion, sinus pressure and pain has been going on for 3 days. She's been taking over-the-counter Motrin with no relief. She has a past medical history of acute sinusitis.   Review of Systems  Constitutional: Negative.   HENT: Positive for congestion, rhinorrhea, sneezing and postnasal drip.   Eyes: Negative.   Respiratory: Positive for cough.   Cardiovascular: Negative.   Gastrointestinal: Negative.   Genitourinary: Negative.   Musculoskeletal: Negative.   Skin: Negative.   Neurological: Negative.   Hematological: Negative.   Psychiatric/Behavioral: Negative.    Past Medical History  Diagnosis Date  . Allergy   . Asthma   . Migraines     History   Social History  . Marital Status: Married    Spouse Name: N/A    Number of Children: N/A  . Years of Education: N/A   Occupational History  . Not on file.   Social History Main Topics  . Smoking status: Current Everyday Smoker -- 1.0 packs/day    Types: Cigarettes  . Smokeless tobacco: Never Used  . Alcohol Use: No  . Drug Use: No  . Sexually Active: Not on file   Other Topics Concern  . Not on file   Social History Narrative  . No narrative on file    No past surgical history on file.  No family history on file.  Allergies  Allergen Reactions  . Azithromycin     REACTION: nausea    Current Outpatient Prescriptions on File Prior to Visit  Medication Sig Dispense Refill  . albuterol (PROVENTIL HFA) 108 (90 BASE) MCG/ACT inhaler Inhale 2 puffs into the lungs every 6 (six) hours as needed.        . loratadine (CLARITIN) 10 MG tablet Take 10 mg by mouth daily.        . rizatriptan (MAXALT) 10 MG tablet Take 10 mg by mouth as needed. May repeat in 2 hours if needed       . NEOMYCIN-POLYMYXIN-HC,  OPHTH, (CORTISPORIN) SUSP Apply 2 drops to eye every 4 (four) hours as needed.  10 mL  0    BP 116/78  Pulse 109  Temp 98.4 F (36.9 C)  Wt 215 lb (97.523 kg)  SpO2 99%chart   Objective:   Physical Exam  Constitutional: She is oriented to person, place, and time. She appears well-developed and well-nourished.  HENT:  Right Ear: External ear normal.  Left Ear: External ear normal.  Nose: Nose normal.  Mouth/Throat: Oropharynx is clear and moist.       Maxillary sinus tenderness to palpation.  Neck: Neck supple.  Cardiovascular: Normal rate, regular rhythm and normal heart sounds.   Pulmonary/Chest: Breath sounds normal.  Musculoskeletal: Normal range of motion.  Neurological: She is alert and oriented to person, place, and time.  Skin: Skin is warm and dry.  Psychiatric: She has a normal mood and affect.          Assessment & Plan:  Assessment: Acute sinusitis, cough  Plan: Amoxicillin 500 mg 2 caps by mouth twice a day x10 days. Over-the-counter Claritin or Zyrtec once daily. Encouraged smoking cessation patient to call the office if symptoms worsen or persist, recheck as scheduled, and when necessary.

## 2011-07-12 ENCOUNTER — Telehealth: Payer: Self-pay | Admitting: Family Medicine

## 2011-07-12 MED ORDER — ALBUTEROL SULFATE HFA 108 (90 BASE) MCG/ACT IN AERS: 2.0000 | INHALATION_SPRAY | RESPIRATORY_TRACT | Status: DC | PRN

## 2011-07-12 MED ORDER — RIZATRIPTAN BENZOATE 10 MG PO TABS: 10.0000 mg | ORAL_TABLET | ORAL | Status: DC | PRN

## 2011-07-12 NOTE — Telephone Encounter (Signed)
She needs refills for Proventil and Maxalt to mail to [facility]

## 2011-08-15 ENCOUNTER — Telehealth: Payer: Self-pay | Admitting: Family Medicine

## 2011-08-15 NOTE — Telephone Encounter (Signed)
Pt called and said that [facility] pt assistance evidently sent the completed form back to Dr Clent Ridges, because pt forgot to date the form in section 1 for pt to fill out. Pt said that Dr Clent Ridges filled his section out correctly. Pt is wondering if she can come in an date the form or does she need to complete a new form?

## 2011-08-15 NOTE — Telephone Encounter (Signed)
I doubt we even still have this form. See what you can find out

## 2011-08-19 NOTE — Telephone Encounter (Signed)
Left voice message, I cannot find a copy of this form in the pt's chart. Advised to bring in another form.

## 2012-04-21 ENCOUNTER — Ambulatory Visit (INDEPENDENT_AMBULATORY_CARE_PROVIDER_SITE_OTHER): Payer: Self-pay | Admitting: Family Medicine

## 2012-04-21 ENCOUNTER — Encounter: Payer: Self-pay | Admitting: Family Medicine

## 2012-04-21 VITALS — BP 116/70 | HR 106 | Temp 97.5°F | Wt 208.0 lb

## 2012-04-21 DIAGNOSIS — J329 Chronic sinusitis, unspecified: Secondary | ICD-10-CM

## 2012-04-21 MED ORDER — AMOXICILLIN 875 MG PO TABS: 875.0000 mg | ORAL_TABLET | Freq: Two times a day (BID) | ORAL | Status: DC

## 2012-04-21 NOTE — Progress Notes (Signed)
  Subjective:    Patient ID: Diana Nelson, female    DOB: [birth year], 31 y.o.   MRN: [id number]  HPI Here for one week of sinus pressure, PND, and HA. No ST or cough.    Review of Systems  Constitutional: Negative.   HENT: Positive for congestion, postnasal drip and sinus pressure.   Eyes: Negative.   Respiratory: Negative.        Objective:   Physical Exam  Constitutional: She appears well-developed and well-nourished.  HENT:  Right Ear: External ear normal.  Left Ear: External ear normal.  Nose: Nose normal.  Mouth/Throat: Oropharynx is clear and moist.  Eyes: Conjunctivae normal are normal.  Pulmonary/Chest: Effort normal and breath sounds normal.  Lymphadenopathy:    She has no cervical adenopathy.          Assessment & Plan:  Add Mucinex

## 2012-07-13 ENCOUNTER — Ambulatory Visit (INDEPENDENT_AMBULATORY_CARE_PROVIDER_SITE_OTHER): Payer: Managed Care, Other (non HMO) | Admitting: Family Medicine

## 2012-07-13 ENCOUNTER — Encounter: Payer: Self-pay | Admitting: Family Medicine

## 2012-07-13 VITALS — BP 120/72 | HR 99 | Temp 98.3°F | Wt 215.0 lb

## 2012-07-13 DIAGNOSIS — J019 Acute sinusitis, unspecified: Secondary | ICD-10-CM

## 2012-07-13 MED ORDER — AMOXICILLIN 875 MG PO TABS: 875.0000 mg | ORAL_TABLET | Freq: Two times a day (BID) | ORAL | Status: AC

## 2012-07-13 MED ORDER — FLUTICASONE PROPIONATE 50 MCG/ACT NA SUSP: 2.0000 | Freq: Every day | NASAL | Status: DC

## 2012-07-13 NOTE — Progress Notes (Signed)
  Subjective:    Patient ID: Diana Nelson, female    DOB: 1981/01/22, 32 y.o.   MRN: [id number]  HPI Here for one week of sinus pressure, burning in the nose, PND, HA, and ST. No fever. On Sudafed.    Review of Systems  Constitutional: Negative.   HENT: Positive for congestion, postnasal drip and sinus pressure.   Eyes: Negative.   Respiratory: Negative.        Objective:   Physical Exam  Constitutional: She appears well-developed and well-nourished.  HENT:  Right Ear: External ear normal.  Left Ear: External ear normal.  Nose: Nose normal.  Mouth/Throat: Oropharynx is clear and moist.  Eyes: Conjunctivae are normal.  Neck: No thyromegaly present.  Pulmonary/Chest: Effort normal and breath sounds normal.  Lymphadenopathy:    She has no cervical adenopathy.          Assessment & Plan:  Recheck prn

## 2013-02-26 ENCOUNTER — Ambulatory Visit (INDEPENDENT_AMBULATORY_CARE_PROVIDER_SITE_OTHER): Payer: Managed Care, Other (non HMO) | Admitting: Family Medicine

## 2013-02-26 ENCOUNTER — Encounter: Payer: Self-pay | Admitting: Family Medicine

## 2013-02-26 VITALS — BP 118/74 | HR 104 | Temp 98.5°F | Wt 232.0 lb

## 2013-02-26 DIAGNOSIS — F411 Generalized anxiety disorder: Secondary | ICD-10-CM

## 2013-02-26 DIAGNOSIS — Z23 Encounter for immunization: Secondary | ICD-10-CM

## 2013-02-26 DIAGNOSIS — G43909 Migraine, unspecified, not intractable, without status migrainosus: Secondary | ICD-10-CM

## 2013-02-26 DIAGNOSIS — J45909 Unspecified asthma, uncomplicated: Secondary | ICD-10-CM

## 2013-02-26 MED ORDER — RIZATRIPTAN BENZOATE 10 MG PO TABS: 10.0000 mg | ORAL_TABLET | ORAL | Status: DC | PRN

## 2013-02-26 MED ORDER — ALBUTEROL SULFATE HFA 108 (90 BASE) MCG/ACT IN AERS: 2.0000 | INHALATION_SPRAY | RESPIRATORY_TRACT | Status: DC | PRN

## 2013-02-26 NOTE — Progress Notes (Signed)
  Subjective:    Patient ID: Diana Nelson, female    DOB: 07-Jun-1980, 32 y.o.   MRN: [id number]  HPI Here to follow up. She has been doing well. Her allergies are controlled. She seldom has to use her inhaler. She quit smoking 8 months ago.    Review of Systems  Constitutional: Negative.   Respiratory: Negative.   Cardiovascular: Negative.        Objective:   Physical Exam  Constitutional: She appears well-developed and well-nourished.  Cardiovascular: Normal rate, regular rhythm, normal heart sounds and intact distal pulses.   Pulmonary/Chest: Effort normal and breath sounds normal.          Assessment & Plan:  Refilled meds.

## 2013-06-25 ENCOUNTER — Encounter: Payer: Self-pay | Admitting: Family Medicine

## 2013-06-25 ENCOUNTER — Ambulatory Visit (INDEPENDENT_AMBULATORY_CARE_PROVIDER_SITE_OTHER): Payer: Managed Care, Other (non HMO) | Admitting: Family Medicine

## 2013-06-25 VITALS — BP 116/76 | HR 110 | Temp 98.3°F | Ht 62.0 in | Wt 220.0 lb

## 2013-06-25 DIAGNOSIS — K219 Gastro-esophageal reflux disease without esophagitis: Secondary | ICD-10-CM

## 2013-06-25 DIAGNOSIS — F411 Generalized anxiety disorder: Secondary | ICD-10-CM

## 2013-06-25 MED ORDER — DIAZEPAM 2 MG PO TABS: 2.0000 mg | ORAL_TABLET | Freq: Three times a day (TID) | ORAL | Status: DC | PRN

## 2013-06-25 MED ORDER — OMEPRAZOLE 40 MG PO CPDR: 40.0000 mg | DELAYED_RELEASE_CAPSULE | Freq: Every day | ORAL | Status: DC

## 2013-06-25 NOTE — Progress Notes (Signed)
Pre visit review using our clinic review tool, if applicable. No additional management support is needed unless otherwise documented below in the visit note. 

## 2013-06-25 NOTE — Progress Notes (Signed)
   Subjective:    Patient ID: Diana Nelson, female    DOB: 1980-11-18, 33 y.o.   MRN: [id number]  HPI Here for several weeks of episodes where she feels like her throat is closing up and when this happens she feels very anxious. She thinks she may be having "mini panic attacks". She had taken Zoloft and Valium in the past, but her anxiety has been stable for several years. Her asthma has been well controlled and she has no SOB during these spells. She has a hx of GERD and had taken Aciphex in the past. Lately she admits to having more heartburn than usual. She sleeps well. She has stayed off nicotine for the past year. She is working out at [employer] 3 days a week.    Review of Systems  Constitutional: Negative.   HENT: Negative.   Respiratory: Positive for choking. Negative for apnea, cough, chest tightness, shortness of breath, wheezing and stridor.   Cardiovascular: Negative.   Gastrointestinal: Negative.   Psychiatric/Behavioral: Negative for hallucinations, confusion, dysphoric mood, decreased concentration and agitation. The patient is nervous/anxious.        Objective:   Physical Exam  Constitutional: She appears well-developed and well-nourished.  HENT:  Head: Normocephalic and atraumatic.  Right Ear: External ear normal.  Left Ear: External ear normal.  Nose: Nose normal.  Mouth/Throat: Oropharynx is clear and moist. No oropharyngeal exudate.  Eyes: Conjunctivae are normal.  Neck: Neck supple. No thyromegaly present.  Pulmonary/Chest: Effort normal and breath sounds normal.  Lymphadenopathy:    She has no cervical adenopathy.  Psychiatric: She has a normal mood and affect. Her behavior is normal. Thought content normal.          Assessment & Plan:  She is probably having some pharyngeal irritation from GERD so we will start her on Omeprazole daily. Given some Diazepam to use prn.

## 2013-06-28 ENCOUNTER — Encounter (HOSPITAL_COMMUNITY): Payer: Self-pay | Admitting: Emergency Medicine

## 2013-06-28 ENCOUNTER — Emergency Department (HOSPITAL_COMMUNITY)
Admission: EM | Admit: 2013-06-28 | Discharge: 2013-06-29 | Disposition: A | Discharge status: Home / Self Care | Payer: Managed Care, Other (non HMO) | Attending: Emergency Medicine | Admitting: Emergency Medicine

## 2013-06-28 DIAGNOSIS — R6889 Other general symptoms and signs: Secondary | ICD-10-CM | POA: Insufficient documentation

## 2013-06-28 DIAGNOSIS — Z79899 Other long term (current) drug therapy: Secondary | ICD-10-CM | POA: Insufficient documentation

## 2013-06-28 DIAGNOSIS — Z87891 Personal history of nicotine dependence: Secondary | ICD-10-CM | POA: Insufficient documentation

## 2013-06-28 DIAGNOSIS — F411 Generalized anxiety disorder: Secondary | ICD-10-CM | POA: Insufficient documentation

## 2013-06-28 DIAGNOSIS — R0989 Other specified symptoms and signs involving the circulatory and respiratory systems: Secondary | ICD-10-CM

## 2013-06-28 DIAGNOSIS — G43909 Migraine, unspecified, not intractable, without status migrainosus: Secondary | ICD-10-CM | POA: Insufficient documentation

## 2013-06-28 DIAGNOSIS — J45909 Unspecified asthma, uncomplicated: Secondary | ICD-10-CM | POA: Insufficient documentation

## 2013-06-28 NOTE — ED Provider Notes (Signed)
CSN: [id number]     Arrival date & time 06/28/13  2037 History  This chart was scribed for non-physician practitioner Philis KendallGail Ambriella Kitt,NP working with Vida RollerBrian D Miller, MD by Joaquin MusicKristina Sanchez-Matthews, ED Scribe. This patient was seen in room WTR9/WTR9 and the patient's care was started at 12:07 AM .   Chief Complaint  Patient presents with  . Anxiety  . Sore Throat   Patient is a 33 y.o. female presenting with anxiety and pharyngitis. The history is provided by the patient. No language interpreter was used.  Anxiety This is a recurrent problem. The current episode started today. The problem has been unchanged. Pertinent negatives include no abdominal pain. Nothing aggravates the symptoms. She has tried nothing for the symptoms. The treatment provided no relief.  Sore Throat Pertinent negatives include no abdominal pain.  Anxiety This is a recurrent problem. The current episode started today. The problem has been unchanged. Pertinent negatives include no abdominal pain, congestion, coughing, fever, neck pain, sore throat or vomiting. Nothing aggravates the symptoms. She has tried nothing for the symptoms. The treatment provided no relief.  Sore Throat Pertinent negatives include no abdominal pain, congestion, coughing, fever, neck pain, sore throat or vomiting.   HPI Comments: Diana Nelson is a 33 y.o. female with a hx of anxiety and GERD who presents to the Emergency Department complaining of ongoing sore throat and anxiety with associated postnasal drip and congestion that began 3 weeks ago. Pt states she has been SOB and states her anxiety has been increasing due to feeling "like her throat was closing". Pt was seen by PCP 4 days ago, for same CC and was prescribed Prilosec and Vallum. Pt denies taking medications as prescribed. Pt denies sick contacts. Pt denies rhinorrhea and fever.  Past Medical History  Diagnosis Date  . Allergy   . Asthma   . Migraines    History reviewed. No pertinent  past surgical history. History reviewed. No pertinent family history. History  Substance Use Topics  . Smoking status: Former Smoker -- 1.00 packs/day    Types: Cigarettes    Quit date: 06/16/2012  . Smokeless tobacco: Never Used  . Alcohol Use: No   OB History   Grav Para Term Preterm Abortions TAB SAB Ect Mult Living                 Review of Systems  Constitutional: Negative for fever.  HENT: Negative for congestion, drooling, rhinorrhea, sore throat and trouble swallowing.   Respiratory: Negative for cough.   Gastrointestinal: Negative for vomiting and abdominal pain.  Musculoskeletal: Negative for neck pain.    Allergies  Azithromycin  Home Medications   Current Outpatient Rx  Name  Route  Sig  Dispense  Refill  . loratadine (CLARITIN) 10 MG tablet   Oral   Take 10 mg by mouth at bedtime as needed for allergies.          Marland Kitchen. omeprazole (PRILOSEC) 40 MG capsule   Oral   Take 1 capsule (40 mg total) by mouth daily.   30 capsule   11   . polyvinyl alcohol-povidone (REFRESH) 1.4-0.6 % ophthalmic solution   Both Eyes   Place 1-2 drops into both eyes as needed (for allergies).         Marland Kitchen. albuterol (PROVENTIL HFA) 108 (90 BASE) MCG/ACT inhaler   Inhalation   Inhale 2 puffs into the lungs every 4 (four) hours as needed for wheezing or shortness of breath.   1 Inhaler  11   . rizatriptan (MAXALT) 10 MG tablet   Oral   Take 1 tablet (10 mg total) by mouth as needed for migraine. May repeat in 2 hours if needed   10 tablet   11    BP 123/85  Pulse 94  Temp(Src) 98.6 F (37 C) (Oral)  Resp 20  Ht 5\' 2"  (1.575 m)  Wt 217 lb (98.431 kg)  BMI 39.68 kg/m2  SpO2 97%  LMP 06/14/2013  Physical Exam  Nursing note and vitals reviewed. Constitutional: She is oriented to person, place, and time. She appears well-developed and well-nourished. No distress.  HENT:  Head: Normocephalic and atraumatic.  Mouth/Throat: Oropharynx is clear and moist. No oropharyngeal  exudate.  No tonsils.  Uvula, not swollen.  No post pharyngeal, exudate, or erythema  Eyes: EOM are normal.  Neck: Neck supple. No tracheal deviation present.  Cardiovascular: Normal rate.   Pulmonary/Chest: Effort normal. No respiratory distress.  Musculoskeletal: Normal range of motion.  Lymphadenopathy:    She has no cervical adenopathy.  Neurological: She is alert and oriented to person, place, and time.  Skin: Skin is warm and dry. No rash noted.  Psychiatric: She has a normal mood and affect. Her behavior is normal.    ED Course  Procedures  DIAGNOSTIC STUDIES: Oxygen Saturation is 97% on RA, normal by my interpretation.    COORDINATION OF CARE: 12:10 AM-Discussed treatment plan which includes discharge pt with medications and will refer pt to ENT. Pt declined Adivan. Advised pt to F/U with PCP. Pt agreed to plan.   Labs Review Labs Reviewed - No data to display Imaging Review No results found.  EKG Interpretation   None      MDM   Final diagnoses:  Globus sensation    I recommend that she continue taking the Prilosec.  She take the Valium.  She was prescribed, as needed.  For this sensation of throat closing.  She will also followup with her primary care physician.  I've also given her a referral to ENT for further evaluation At this time.  It does not appear to be any infectious cause.  There is no fever.  There is no exudate.  There are no cervical adenopathy.  I reassured the patient.   I personally performed the services described in this documentation, which was scribed in my presence. The recorded information has been reviewed and is accurate.    Arman Filter, NP 06/29/13 [id number]

## 2013-06-28 NOTE — ED Notes (Signed)
Pt reports having a sore throat for the past 3 weeks. Pt states hx of anxiety, and the sore throat and ShOB makes her anxiety increase, increasing her feeling of ShOB and states it feels like her throat is closing, even though she knows it is not. Pt a&o x4 NAD noted at this time.

## 2013-06-29 NOTE — Discharge Instructions (Signed)
Please call your primary care physician in the morning to discuss tonight.  ED visit.  You've also been referred to ENT for further evaluation.  If Dr. Clent RidgesFry who is his is necessary

## 2013-06-29 NOTE — ED Provider Notes (Signed)
Medical screening examination/treatment/procedure(s) were performed by non-physician practitioner and as supervising physician I was immediately available for consultation/collaboration.    Sorrel Cassetta D Khaliel Morey, MD 06/29/13 0651 

## 2013-07-01 ENCOUNTER — Encounter: Payer: Self-pay | Admitting: Physician Assistant

## 2013-07-02 ENCOUNTER — Encounter: Payer: Self-pay | Admitting: Family Medicine

## 2013-07-02 ENCOUNTER — Ambulatory Visit (INDEPENDENT_AMBULATORY_CARE_PROVIDER_SITE_OTHER): Payer: Managed Care, Other (non HMO) | Admitting: Family Medicine

## 2013-07-02 ENCOUNTER — Telehealth: Payer: Self-pay | Admitting: Family Medicine

## 2013-07-02 VITALS — BP 120/80 | HR 107 | Temp 98.0°F | Ht 62.0 in | Wt 213.0 lb

## 2013-07-02 DIAGNOSIS — K219 Gastro-esophageal reflux disease without esophagitis: Secondary | ICD-10-CM

## 2013-07-02 DIAGNOSIS — F411 Generalized anxiety disorder: Secondary | ICD-10-CM

## 2013-07-02 NOTE — Telephone Encounter (Signed)
Pt was seen today and needs a work note.

## 2013-07-02 NOTE — Progress Notes (Signed)
Pre visit review using our clinic review tool, if applicable. No additional management support is needed unless otherwise documented below in the visit note. 

## 2013-07-02 NOTE — Progress Notes (Signed)
   Subjective:    Patient ID: Diana NuttingHolly K Nelson, female    DOB: 18-Sep-1980, 33 y.o.   MRN: [id number]  HPI Here to follow up on anxiety and GI symptoms. She has had GERD sx and a sensation of her throat closing. She has also had a lot of anxiety. We started her on Prilosec daily and gave her Valium to use prn. She went to the ED a few days ago, and they essentially gave her the same advice. Now she describes some intermittent epigastric pains. No N oV. She has been constipated lately.    Review of Systems  Constitutional: Negative.   HENT: Positive for trouble swallowing.   Respiratory: Negative.   Cardiovascular: Negative.   Gastrointestinal: Positive for abdominal pain. Negative for nausea, vomiting, diarrhea, constipation and blood in stool.       Objective:   Physical Exam  Constitutional: She appears well-developed and well-nourished.  Cardiovascular: Normal rate, regular rhythm, normal heart sounds and intact distal pulses.   Pulmonary/Chest: Effort normal and breath sounds normal.          Assessment & Plan:  She clearly has GERD so I encouraged her to stay on daily Prilosec. Try Miralax for constipation. She is scheduled to see GI next week.

## 2013-07-02 NOTE — Telephone Encounter (Signed)
I wrote a note stating that pt was seen here in the office today and it is ready for pick up. I left a voice message for pt with this information.

## 2013-07-06 ENCOUNTER — Ambulatory Visit: Payer: Managed Care, Other (non HMO) | Admitting: Physician Assistant

## 2013-07-13 ENCOUNTER — Ambulatory Visit: Payer: Managed Care, Other (non HMO) | Admitting: Physician Assistant

## 2013-07-14 ENCOUNTER — Telehealth: Payer: Self-pay | Admitting: Family Medicine

## 2013-07-14 NOTE — Telephone Encounter (Signed)
Sorry but I cannot take her, thanks

## 2013-07-14 NOTE — Telephone Encounter (Signed)
Pt would like for you to accept her aunt as new pt. Pt has medicare [employer] Diana Nelson [phone]

## 2013-07-16 NOTE — Telephone Encounter (Signed)
Pt aunt is aware

## 2013-07-19 ENCOUNTER — Ambulatory Visit (INDEPENDENT_AMBULATORY_CARE_PROVIDER_SITE_OTHER): Payer: Managed Care, Other (non HMO) | Admitting: Physician Assistant

## 2013-07-19 ENCOUNTER — Other Ambulatory Visit (INDEPENDENT_AMBULATORY_CARE_PROVIDER_SITE_OTHER): Payer: Managed Care, Other (non HMO)

## 2013-07-19 ENCOUNTER — Encounter: Payer: Self-pay | Admitting: Physician Assistant

## 2013-07-19 VITALS — BP 124/84 | HR 115 | Ht 62.0 in | Wt 212.2 lb

## 2013-07-19 DIAGNOSIS — R6881 Early satiety: Secondary | ICD-10-CM

## 2013-07-19 DIAGNOSIS — R1013 Epigastric pain: Secondary | ICD-10-CM

## 2013-07-19 DIAGNOSIS — R110 Nausea: Secondary | ICD-10-CM

## 2013-07-19 LAB — CBC WITH DIFFERENTIAL/PLATELET
BASOS PCT: 0.7 % (ref 0.0–3.0)
Basophils Absolute: 0.1 10*3/uL (ref 0.0–0.1)
EOS ABS: 0.1 10*3/uL (ref 0.0–0.7)
Eosinophils Relative: 0.9 % (ref 0.0–5.0)
HEMATOCRIT: 39 % (ref 36.0–46.0)
Hemoglobin: 12.5 g/dL (ref 12.0–15.0)
LYMPHS PCT: 18.7 % (ref 12.0–46.0)
Lymphs Abs: 1.8 10*3/uL (ref 0.7–4.0)
MCHC: 32.1 g/dL (ref 30.0–36.0)
MCV: 87.9 fl (ref 78.0–100.0)
Monocytes Absolute: 0.6 10*3/uL (ref 0.1–1.0)
Monocytes Relative: 6.1 % (ref 3.0–12.0)
NEUTROS ABS: 7.2 10*3/uL (ref 1.4–7.7)
Neutrophils Relative %: 73.6 % (ref 43.0–77.0)
Platelets: 241 10*3/uL (ref 150.0–400.0)
RBC: 4.44 Mil/uL (ref 3.87–5.11)
RDW: 13.6 % (ref 11.5–14.6)
WBC: 9.8 10*3/uL (ref 4.5–10.5)

## 2013-07-19 LAB — HCG, QUANTITATIVE, PREGNANCY

## 2013-07-19 LAB — COMPREHENSIVE METABOLIC PANEL
ALT: 14 U/L (ref 0–35)
AST: 14 U/L (ref 0–37)
Albumin: 4.1 g/dL (ref 3.5–5.2)
Alkaline Phosphatase: 105 U/L (ref 39–117)
BUN: 9 mg/dL (ref 6–23)
CALCIUM: 9.4 mg/dL (ref 8.4–10.5)
CHLORIDE: 101 meq/L (ref 96–112)
CO2: 27 meq/L (ref 19–32)
CREATININE: 0.8 mg/dL (ref 0.4–1.2)
GFR: 84.43 mL/min (ref >60.00)
GLUCOSE: 99 mg/dL (ref 70–99)
Potassium: 3.7 mEq/L (ref 3.5–5.1)
Sodium: 136 mEq/L (ref 135–145)
Total Bilirubin: 0.5 mg/dL (ref 0.3–1.2)
Total Protein: 7.6 g/dL (ref 6.0–8.3)

## 2013-07-19 MED ORDER — ONDANSETRON HCL 4 MG PO TABS: ORAL_TABLET | ORAL | Status: DC

## 2013-07-19 MED ORDER — OMEPRAZOLE-SODIUM BICARBONATE 40-1100 MG PO CAPS: 1.0000 | ORAL_CAPSULE | Freq: Every day | ORAL | Status: DC

## 2013-07-19 NOTE — Patient Instructions (Signed)
We have gvien you samples for Gas and bloating- Phazyme. Take 2 caps before a meal. Please go to the basement level to have your labs drawn.  We have given you samples of Zegerid 40 mg.  If they help, call back for a prescription . [facility] [address] [place]   We [facility]  Arrive at 7:45 am.  Go to patient registration inside front door of hospital.  Have nothing to eat or drink after midnight.   If  you are feeling significantly better by mid April you can consider cancelling the Endoscopy.

## 2013-07-19 NOTE — Progress Notes (Signed)
Subjective:    Patient ID: Diana Nelson, female    DOB: Feb 23, 1981, 33 y.o.   MRN: [id number]  HPI   Diana Nelson is a pleasant 33 year old female referred today by Dr. Abran CantorFrye with complaints of anxiety, nausea and abdominal pain. Patient has had complaints of reflux in the past but had not been on any chronic PPI. She started on Prilosec 40 mg daily 3 or 4 weeks ago and had been taking TUMS as needed prior to that. She complains of a tight feeling in her throat and points to her neck -this has in the past been associated with anxiety and panic. She also complains of a full bloated feeling in her epigastrium and says that she feels like she can only eat a few bites before she is full. This has been occurring over the past month. She has developed nausea with any substantial amount of food. She has no heartburn or indigestion while taking the Prilosec. She does state she had been exercising and watching her diet trying to lose weight but now over the past week or so has continued to lose weight and has lost another 5 pounds despite the fact that she's not been exercising. She says she really can't eat very much and that she feels poorly. She's not had any fever or chills. No diarrhea melena or hematochezia no vomiting. She had been on ibuprofen recently about 3 per day but says she has stopped taking that a couple of weeks ago. She started feeling poorly and nauseated off and on all day long. She does not feel that she could be pregnant and says she had a menstrual period about a week ago    Review of Systems  Constitutional: Positive for appetite change and unexpected weight change.  HENT: Negative.   Eyes: Negative.   Respiratory: Negative.   Gastrointestinal: Positive for nausea and abdominal pain.  Endocrine: Negative.   Genitourinary: Negative.   Musculoskeletal: Negative.   Skin: Negative.   Allergic/Immunologic: Negative.   Neurological: Negative.   Hematological: Negative.     Psychiatric/Behavioral: The patient is nervous/anxious.    Outpatient Prescriptions Prior to Visit  Medication Sig Dispense Refill  . albuterol (PROVENTIL HFA) 108 (90 BASE) MCG/ACT inhaler Inhale 2 puffs into the lungs every 4 (four) hours as needed for wheezing or shortness of breath.  1 Inhaler  11  . diazepam (VALIUM) 2 MG tablet Take 2 mg by mouth every 8 (eight) hours as needed for anxiety. Only takes 1/4 of a tablet      . loratadine (CLARITIN) 10 MG tablet Take 10 mg by mouth at bedtime as needed for allergies.       Marland Kitchen. omeprazole (PRILOSEC) 40 MG capsule Take 1 capsule (40 mg total) by mouth daily.  30 capsule  11  . polyvinyl alcohol-povidone (REFRESH) 1.4-0.6 % ophthalmic solution Place 1-2 drops into both eyes as needed (for allergies).      . rizatriptan (MAXALT) 10 MG tablet Take 1 tablet (10 mg total) by mouth as needed for migraine. May repeat in 2 hours if needed  10 tablet  11   No facility-administered medications prior to visit.   Allergies  Allergen Reactions  . Azithromycin     REACTION: nausea   Patient Active Problem List   Diagnosis Date Noted  . VIRAL URI 07/12/2009  . GERD 01/12/2009  . NAUSEA 01/12/2009  . 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 TENDINITIS 04/29/2008  . PHARYNGITIS 09/15/2007  . ACUTE SINUSITIS, UNSPECIFIED 07/31/2007  .  ASTHMA 07/31/2007  . URTICARIA NOS 03/12/2007  . UTI 01/20/2007  . ANXIETY 09/25/2006  . 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 MIGRAINE, CHRONIC 09/24/2006   History  Substance Use Topics  . Smoking status: Former Smoker -- 1.00 packs/day    Types: Cigarettes    Quit date: 06/16/2012  . Smokeless tobacco: Never Used  . Alcohol Use: No   family history is not on file.     Objective:   Physical Exam White female in no acute distress, anxious blood pressure 124 bradycardia 4 pulse 110 height 5 foot 2 weight 212 BMI 38. HEENT ;nontraumatic normocephalic EOMI PERRLA sclera anicteric, Supple; no JVD,  Cardiovascular; regular rate and rhythm with S1-S2 slightly tachycardia Pulmonary; clear bilaterally, Abdomen; soft bowel sounds are present some mild tenderness in the epigastrium there is no guarding or rebound no palpable mass or hepatosplenomegaly, Rectal ;exam not done, Extremities; no clubbing cyanosis or edema skin warm dry, Psych; mood and affect appropriate, she is anxious       Assessment & Plan:  #16 33 year old female with significant history of anxiety and panic with complaints of nausea early satiety and epigastric discomfort Rule out gastritis peptic ulcer disease gallbladder disease and gastroparesis #2 history of asthma  Plan; Schedule for upper abdominal ultrasound CBC with differential CMET and beta hCG Trial of Zofran 4 mg every 6 hours as needed for nausea Switch PPI to Zegerid 40 mg by mouth every morning Schedule for upper endoscopy with Dr. Leone Payor in 2-3 weeks. Patient is anxious about having the procedure done and would like to try other measures first. She is reassured that if her symptoms improve with the above measures we can cancel the procedure

## 2013-07-19 NOTE — Progress Notes (Signed)
Agree with Ms. Esterwood's assessment and plan. Calogero Geisen E. Caeden Foots, MD, FACG   

## 2013-07-21 ENCOUNTER — Encounter: Payer: Self-pay | Admitting: Internal Medicine

## 2013-07-26 ENCOUNTER — Ambulatory Visit (HOSPITAL_COMMUNITY)
Admission: RE | Admit: 2013-07-26 | Discharge: 2013-07-26 | Disposition: A | Discharge status: Home / Self Care | Payer: Managed Care, Other (non HMO) | Source: Ambulatory Visit | Attending: Physician Assistant | Admitting: Physician Assistant

## 2013-07-26 ENCOUNTER — Other Ambulatory Visit: Payer: Self-pay | Admitting: *Deleted

## 2013-07-26 DIAGNOSIS — R1013 Epigastric pain: Secondary | ICD-10-CM

## 2013-07-26 DIAGNOSIS — R6881 Early satiety: Secondary | ICD-10-CM

## 2013-07-26 DIAGNOSIS — R109 Unspecified abdominal pain: Secondary | ICD-10-CM | POA: Insufficient documentation

## 2013-07-26 DIAGNOSIS — R110 Nausea: Secondary | ICD-10-CM

## 2013-07-26 MED ORDER — OMEPRAZOLE-SODIUM BICARBONATE 40-1100 MG PO CAPS: 1.0000 | ORAL_CAPSULE | Freq: Every day | ORAL | Status: DC

## 2013-07-29 ENCOUNTER — Telehealth: Payer: Self-pay | Admitting: Internal Medicine

## 2013-07-29 MED ORDER — OMEPRAZOLE-SODIUM BICARBONATE 40-1100 MG PO CAPS: 1.0000 | ORAL_CAPSULE | Freq: Every day | ORAL | Status: DC

## 2013-07-29 NOTE — Telephone Encounter (Signed)
Rx sent to pharmacy   

## 2013-08-02 ENCOUNTER — Telehealth: Payer: Self-pay | Admitting: Physician Assistant

## 2013-08-02 MED ORDER — OMEPRAZOLE-SODIUM BICARBONATE 40-1100 MG PO CAPS: 1.0000 | ORAL_CAPSULE | Freq: Every day | ORAL | Status: DC

## 2013-08-02 NOTE — Telephone Encounter (Signed)
I spoke to the patient and I asked her what she had tried before.  She said Prilosec but she said it hurt her stomach.  She said the Zegerid 40 mg was too expensive.  She said there is Zegerid 20 mg over the counter and she was going to try that . I told her that is ok and if she thinks she needs the 40 mg to take 2. I don't know if that is cheaper than a prescription or not.  I told her to let me know if the Zegerid 20 mg is effective.

## 2013-09-03 ENCOUNTER — Encounter: Payer: Managed Care, Other (non HMO) | Admitting: Internal Medicine

## 2013-09-14 NOTE — Telephone Encounter (Signed)
rx sent

## 2013-10-20 ENCOUNTER — Encounter: Payer: Self-pay | Admitting: Family Medicine

## 2013-10-20 ENCOUNTER — Ambulatory Visit (INDEPENDENT_AMBULATORY_CARE_PROVIDER_SITE_OTHER): Payer: Managed Care, Other (non HMO) | Admitting: Family Medicine

## 2013-10-20 VITALS — BP 120/85 | HR 92 | Temp 99.3°F | Ht 62.0 in | Wt 217.0 lb

## 2013-10-20 DIAGNOSIS — J029 Acute pharyngitis, unspecified: Secondary | ICD-10-CM

## 2013-10-20 LAB — POCT RAPID STREP A (OFFICE): Rapid Strep A Screen: NEGATIVE

## 2013-10-20 MED ORDER — CEPHALEXIN 500 MG PO CAPS: 500.0000 mg | ORAL_CAPSULE | Freq: Three times a day (TID) | ORAL | Status: AC

## 2013-10-20 NOTE — Addendum Note (Signed)
Addended by: Bonnye Fava on: 10/20/2013 12:38 PM   Modules accepted: Orders

## 2013-10-20 NOTE — Progress Notes (Signed)
Pre visit review using our clinic review tool, if applicable. No additional management support is needed unless otherwise documented below in the visit note. 

## 2013-10-20 NOTE — Progress Notes (Signed)
   Subjective:    Patient ID: Diana Nelson, female    DOB: 02/05/1981, 33 y.o.   MRN: [id number]  HPI Here for 3 days of what she thinks is a strep throat infection. She has a severe ST, HA, fever, and a dry cough. Taking Motrin.    Review of Systems  Constitutional: Positive for fever.  HENT: Positive for sore throat. Negative for congestion, postnasal drip and sinus pressure.   Eyes: Negative.   Respiratory: Positive for cough.   Gastrointestinal: Negative.        Objective:   Physical Exam  Constitutional: She appears well-developed and well-nourished.  HENT:  Right Ear: External ear normal.  Left Ear: External ear normal.  Nose: Nose normal.  Posterior OP is red and has whitish exudate   Eyes: Conjunctivae are normal.  Neck:  Shotty AC nodes   Pulmonary/Chest: Effort normal and breath sounds normal.          Assessment & Plan:  Probable strep throat. Get a culture. Start on Keflex.

## 2013-10-22 ENCOUNTER — Telehealth: Payer: Self-pay | Admitting: Family Medicine

## 2013-10-22 LAB — CULTURE, GROUP A STREP: ORGANISM ID, BACTERIA: NORMAL

## 2013-10-22 NOTE — Telephone Encounter (Signed)
Noted  

## 2013-10-22 NOTE — Telephone Encounter (Signed)
Patient Information:  Caller Name: [place]  Phone: [phone]  Patient: Diana Nelson, Diana Nelson  Gender: Female  DOB: 10-Feb-1981  Age: 33 Years  PCP: Gershon Cr[facility])  Pregnant: No  Office Follow Up:  Does the office need to follow up with this patient?: No  Instructions For The Office: N/A  RN Note:  Advised pt to continue taking medication and if niot improving after 72 hours call back.  Symptoms  Reason For Call & Symptoms: Pt reports she is taking Keflex for Strep Throat and has cough and congestation  is not improved. Pt wanting to know if Keflex is working. Pt re4ports started Keflex  09/19/13.  Reviewed Health History In EMR: Yes  Reviewed Medications In EMR: Yes  Reviewed Allergies In EMR: Yes  Reviewed Surgeries / Procedures: Yes  Date of Onset of Symptoms: 10/22/2013 OB / GYN:  LMP: Unknown  Guideline(s) Used:  Cough  No Protocol Available - Sick Adult  Disposition Per Guideline:   Home Care  Reason For Disposition Reached:   Patient's symptoms are safe to treat at home per nursing judgment  Advice Given:  Call Back If:  New symptoms develop  You become worse.  Patient Will Follow Care Advice:  YES

## 2013-11-02 ENCOUNTER — Ambulatory Visit: Payer: Managed Care, Other (non HMO) | Admitting: Family Medicine

## 2013-11-09 ENCOUNTER — Ambulatory Visit (INDEPENDENT_AMBULATORY_CARE_PROVIDER_SITE_OTHER): Payer: Managed Care, Other (non HMO) | Admitting: Family Medicine

## 2013-11-09 ENCOUNTER — Encounter: Payer: Self-pay | Admitting: Family Medicine

## 2013-11-09 VITALS — BP 130/82 | HR 85 | Temp 99.1°F | Ht 62.0 in | Wt 221.0 lb

## 2013-11-09 DIAGNOSIS — J069 Acute upper respiratory infection, unspecified: Secondary | ICD-10-CM

## 2013-11-09 MED ORDER — AMOXICILLIN-POT CLAVULANATE 875-125 MG PO TABS: 1.0000 | ORAL_TABLET | Freq: Two times a day (BID) | ORAL | Status: DC

## 2013-11-09 NOTE — Progress Notes (Signed)
Pre visit review using our clinic review tool, if applicable. No additional management support is needed unless otherwise documented below in the visit note. 

## 2013-11-09 NOTE — Progress Notes (Signed)
No chief complaint on file.   HPI:   -started: a few weeks ago -symptoms:nasal congestion, sore throat, cough, PND saw pcp and treated with kelflex, strep neg - got better, but then got sick again the last 2 days with cough, pnd, drainage -denies:fever, SOB, NVD, tooth pain -has tried: keflex and antihistamine -sick contacts/travel/risks: denies flu exposure, tick exposure or or Ebola risks - everyone in family has been sick -Hx of: allergies  ROS: See pertinent positives and negatives per HPI.  Past Medical History  Diagnosis Date  . Allergy   . Asthma   . Migraines     Past Surgical History  Procedure Laterality Date  . Spinal fusion      2006   . Knee surgeries      x3  . Tonsillectomy      No family history on file.  History   Social History  . Marital Status: Married    Spouse Name: N/A    Number of Children: N/A  . Years of Education: N/A   Social History Main Topics  . Smoking status: Former Smoker -- 1.00 packs/day    Types: Cigarettes    Quit date: 06/16/2012  . Smokeless tobacco: Never Used  . Alcohol Use: No  . Drug Use: No  . Sexual Activity: None   Other Topics Concern  . None   Social History Narrative  . None    Current outpatient prescriptions:albuterol (PROVENTIL HFA) 108 (90 BASE) MCG/ACT inhaler, Inhale 2 puffs into the lungs every 4 (four) hours as needed for wheezing or shortness of breath., Disp: 1 Inhaler, Rfl: 11;  diazepam (VALIUM) 2 MG tablet, Take 2 mg by mouth every 8 (eight) hours as needed for anxiety. Only takes 1/4 of a tablet, Disp: , Rfl:  ibuprofen (ADVIL,MOTRIN) 200 MG tablet, Take 200 mg by mouth every 6 (six) hours as needed., Disp: , Rfl: ;  loratadine (CLARITIN) 10 MG tablet, Take 10 mg by mouth at bedtime as needed for allergies. , Disp: , Rfl: ;  Omeprazole-Sodium Bicarbonate (ZEGERID OTC PO), Take by mouth daily., Disp: , Rfl:  rizatriptan (MAXALT) 10 MG tablet, Take 1 tablet (10 mg total) by mouth as needed for  migraine. May repeat in 2 hours if needed, Disp: 10 tablet, Rfl: 11;  amoxicillin-clavulanate (AUGMENTIN) 875-125 MG per tablet, Take 1 tablet by mouth 2 (two) times daily., Disp: 20 tablet, Rfl: 0  EXAM:  Filed Vitals:   11/09/13 1529  BP: 130/82  Pulse: 85  Temp: 99.1 F (37.3 C)    Body mass index is 40.41 kg/(m^2).  GENERAL: vitals reviewed and listed above, alert, oriented, appears well hydrated and in no acute distress  HEENT: atraumatic, conjunttiva clear, no obvious abnormalities on inspection of external nose and ears, normal appearance of ear canals and TMs, clear nasal congestion, mild post oropharyngeal erythema with PND, no tonsillar edema or exudate, no sinus TTP  NECK: no obvious masses on inspection  LUNGS: clear to auscultation bilaterally, no wheezes, rales or rhonchi, good air movement  CV: HRRR, no peripheral edema  MS: moves all extremities without noticeable abnormality  PSYCH: pleasant and cooperative, no obvious depression or anxiety  ASSESSMENT AND PLAN:  Discussed the following assessment and plan:  Acute upper respiratory infections of unspecified site - Plan: amoxicillin-clavulanate (AUGMENTIN) 875-125 MG per tablet  -given HPI and exam findings today, a serious infection or illness is unlikely. We discussed potential etiologies, with VURI being most likely, and advised supportive care and monitoring.  We discussed treatment side effects, likely course, antibiotic misuse, transmission, and signs of developing a serious illness. -of course, we advised to return or notify a doctor immediately if symptoms worsen or persist or new concerns arise.    Patient Instructions  -AFRIN for 4 days then STOP  -nasocort daily for 1 month (usually take about a week to start working)  -if sinus pain not getting better start the augementin     KIM, Lucent TechnologiesHANNAH R.

## 2013-11-09 NOTE — Patient Instructions (Signed)
-  AFRIN for 4 days then STOP  -nasocort daily for 1 month (usually take about a week to start working)  -if sinus pain not getting better start the augementin

## 2014-01-12 ENCOUNTER — Other Ambulatory Visit: Payer: Self-pay | Admitting: Family Medicine

## 2014-01-13 NOTE — Telephone Encounter (Signed)
Call in #[phone] with 5 rf 

## 2014-02-13 ENCOUNTER — Emergency Department (HOSPITAL_COMMUNITY)
Admission: EM | Admit: 2014-02-13 | Discharge: 2014-02-13 | Disposition: A | Discharge status: Home / Self Care | Payer: Managed Care, Other (non HMO) | Attending: Emergency Medicine | Admitting: Emergency Medicine

## 2014-02-13 ENCOUNTER — Encounter (HOSPITAL_COMMUNITY): Payer: Self-pay | Admitting: Emergency Medicine

## 2014-02-13 ENCOUNTER — Emergency Department (HOSPITAL_COMMUNITY): Payer: Managed Care, Other (non HMO)

## 2014-02-13 DIAGNOSIS — S8990XA Unspecified injury of unspecified lower leg, initial encounter: Secondary | ICD-10-CM | POA: Diagnosis present

## 2014-02-13 DIAGNOSIS — J45909 Unspecified asthma, uncomplicated: Secondary | ICD-10-CM | POA: Diagnosis not present

## 2014-02-13 DIAGNOSIS — S99919A Unspecified injury of unspecified ankle, initial encounter: Secondary | ICD-10-CM

## 2014-02-13 DIAGNOSIS — Z792 Long term (current) use of antibiotics: Secondary | ICD-10-CM | POA: Insufficient documentation

## 2014-02-13 DIAGNOSIS — Y9301 Activity, walking, marching and hiking: Secondary | ICD-10-CM | POA: Insufficient documentation

## 2014-02-13 DIAGNOSIS — Z87891 Personal history of nicotine dependence: Secondary | ICD-10-CM | POA: Insufficient documentation

## 2014-02-13 DIAGNOSIS — W230XXA Caught, crushed, jammed, or pinched between moving objects, initial encounter: Secondary | ICD-10-CM | POA: Diagnosis not present

## 2014-02-13 DIAGNOSIS — S92919A Unspecified fracture of unspecified toe(s), initial encounter for closed fracture: Secondary | ICD-10-CM | POA: Insufficient documentation

## 2014-02-13 DIAGNOSIS — Y9289 Other specified places as the place of occurrence of the external cause: Secondary | ICD-10-CM | POA: Insufficient documentation

## 2014-02-13 DIAGNOSIS — Z8679 Personal history of other diseases of the circulatory system: Secondary | ICD-10-CM | POA: Insufficient documentation

## 2014-02-13 DIAGNOSIS — Z79899 Other long term (current) drug therapy: Secondary | ICD-10-CM | POA: Insufficient documentation

## 2014-02-13 DIAGNOSIS — S99929A Unspecified injury of unspecified foot, initial encounter: Secondary | ICD-10-CM

## 2014-02-13 DIAGNOSIS — S92912A Unspecified fracture of left toe(s), initial encounter for closed fracture: Secondary | ICD-10-CM

## 2014-02-13 MED ORDER — HYDROCODONE-ACETAMINOPHEN 5-325 MG PO TABS: 1.0000 | ORAL_TABLET | Freq: Four times a day (QID) | ORAL | Status: DC | PRN

## 2014-02-13 MED ORDER — HYDROCODONE-ACETAMINOPHEN 5-325 MG PO TABS
1.0000 | ORAL_TABLET | Freq: Once | ORAL | Status: AC
  Administered 2014-02-13: 1 via ORAL
  Filled 2014-02-13: qty 1

## 2014-02-13 NOTE — ED Notes (Signed)
Pt arrived to the Ed with a complaint of left big toe pain.  Pt doubled her toe over on itself.  Pt is ambulatory at this time.  Pt has taken motrin without relief.

## 2014-02-13 NOTE — Discharge Instructions (Signed)
Buddy Taping of Toes We have taped your toes together to keep them from moving. This is called "buddy taping" since we used a part of your own body to keep the injured part still. We placed soft padding between your toes to keep them from rubbing against each other. Buddy taping will help with healing and to reduce pain. Keep your toes buddy taped together for as long as directed by your caregiver. HOME CARE INSTRUCTIONS   Raise your injured area above the level of your heart while sitting or lying down. Prop it up with pillows.  An ice pack used every twenty minutes, while awake, for the first one to two days may be helpful. Put ice in a plastic bag and put a towel between the bag and your skin.  Watch for signs that the taping is too tight. These signs may be:  Numbness of your taped toes.  Coolness of your taped toes.  Color change in the area beyond the tape.  Increased pain.  If you have any of these signs, loosen or rewrap the tape. If you need to loosen or rewrap the buddy tape, make sure you use the padding again. SEEK IMMEDIATE MEDICAL CARE IF:   You have worse pain, swelling, inflammation (soreness), drainage or bleeding after you rewrap the tape.  Any new problems occur. MAKE SURE YOU:   Understand these instructions.  Will watch your condition.  Will get help right away if you are not doing well or get worse. Document Released: 02/08/2004 Document Revised: 07/29/2011 Document Reviewed: 05/03/2008 [facility] Patient Information 2015 [place], [place]. This information is not intended to replace advice given to you by your health care provider. Make sure you discuss any questions you have with your health care provider. As shown by your x-ray to have a very small avulsion fracture of your left great, toe.  The toes have been buddy taped together for support in U. been placed in a postop healing shoe.  I recommend that he wear the shoe for the next 2 weeks, then as needed.   Thereafter.  I also recommend that you keep your toes taped together for support.  For the next 10 days to 2, weeks, and then as needed.  Thereafter.  Followup with your primary care physician as needed

## 2014-02-13 NOTE — ED Provider Notes (Signed)
Medical screening examination/treatment/procedure(s) were performed by non-physician practitioner and as supervising physician I was immediately available for consultation/collaboration.   EKG Interpretation None        Tomasita Crumble, MD 02/13/14 [phone]

## 2014-02-13 NOTE — ED Provider Notes (Signed)
CSN: [id number]     Arrival date & time 02/13/14  0123 History   First MD Initiated Contact with Patient 02/13/14 0158     Chief Complaint  Patient presents with  . Foot Pain     (Consider location/radiation/quality/duration/timing/severity/associated sxs/prior Treatment) HPI Comments: Patient states she was walking on uneven pavement when she slipped jamming her right great toe into the edge of a concrete slab, bending it backwards.  This was 24 hours ago.  Since that, time.  She's had progressive increasing swelling, bruising.  She's been taking over-the-counter ibuprofen, with some relief  Patient is a 33 y.o. female presenting with lower extremity pain. The history is provided by the patient.  Foot Pain This is a new problem. The current episode started yesterday. The problem occurs constantly. The problem has been gradually worsening. Associated symptoms include joint swelling. Pertinent negatives include no fever or numbness. The symptoms are aggravated by walking. She has tried NSAIDs for the symptoms. The treatment provided mild relief.    Past Medical History  Diagnosis Date  . Allergy   . Asthma   . Migraines    Past Surgical History  Procedure Laterality Date  . Spinal fusion      2006   . Knee surgeries      x3  . Tonsillectomy     History reviewed. No pertinent family history. History  Substance Use Topics  . Smoking status: Former Smoker -- 1.00 packs/day    Types: Cigarettes    Quit date: 06/16/2012  . Smokeless tobacco: Never Used  . Alcohol Use: No   OB History   Grav Para Term Preterm Abortions TAB SAB Ect Mult Living                 Review of Systems  Constitutional: Negative for fever.  Musculoskeletal: Positive for joint swelling.  Skin: Positive for color change.  Neurological: Negative for numbness.  All other systems reviewed and are negative.     Allergies  Azithromycin  Home Medications   Prior to Admission medications     Medication Sig Start Date End Date Taking? Authorizing Provider  albuterol (PROVENTIL HFA) 108 (90 BASE) MCG/ACT inhaler Inhale 2 puffs into the lungs every 4 (four) hours as needed for wheezing or shortness of breath. 02/26/13   Nelwyn Salisbury, MD  amoxicillin-clavulanate (AUGMENTIN) 875-125 MG per tablet Take 1 tablet by mouth 2 (two) times daily. 11/09/13   Terressa Koyanagi, DO  diazepam (VALIUM) 2 MG tablet TAKE 1 TABLET BY MOUTH EVERY 8 HOURS AS NEEDED FOR ANXIETY 01/14/14   Nelwyn Salisbury, MD  HYDROcodone-acetaminophen (NORCO/VICODIN) 5-325 MG per tablet Take 1 tablet by mouth every 6 (six) hours as needed for moderate pain. 02/13/14   Arman Filter, NP  ibuprofen (ADVIL,MOTRIN) 200 MG tablet Take 200 mg by mouth every 6 (six) hours as needed.    Historical Provider, MD  loratadine (CLARITIN) 10 MG tablet Take 10 mg by mouth at bedtime as needed for allergies.     Historical Provider, MD  Omeprazole-Sodium Bicarbonate (ZEGERID OTC PO) Take by mouth daily.    Historical Provider, MD  rizatriptan (MAXALT) 10 MG tablet Take 1 tablet (10 mg total) by mouth as needed for migraine. May repeat in 2 hours if needed 02/26/13   Nelwyn Salisbury, MD   BP 129/85  Temp(Src) 98.6 F (37 C) (Oral)  Resp 16  Ht  (1.6 m)  Wt 217 lb (98.431 kg)  BMI 38.45  kg/m2  SpO2 99%  LMP 02/06/2014 Physical Exam  Nursing note and vitals reviewed. Constitutional: She appears well-developed and well-nourished.  HENT:  Head: Normocephalic.  Eyes: Pupils are equal, round, and reactive to light.  Neck: Normal range of motion.  Cardiovascular: Normal rate.   Pulmonary/Chest: Effort normal.  Musculoskeletal: She exhibits tenderness. She exhibits no edema.       Right foot: She exhibits decreased range of motion, tenderness and swelling.       Feet:  Neurological: She is alert.    ED Course  Procedures (including critical care time) Labs Review Labs Reviewed - No data to display  Imaging Review Dg Toe Great  Left  02/13/2014   CLINICAL DATA:  Left first toe pain after injury.  EXAM: LEFT GREAT TOE  COMPARISON:  None.  FINDINGS: Tiny avulsion fragment off of the medial aspect of the distal proximal phalanx of the first toe. Soft tissue swelling. Joint spaces are preserved. No radiopaque foreign bodies.  IMPRESSION: Avulsion fracture off of the distal aspect of the proximal phalanx of the first toe.   Electronically Signed   By: Burman Nieves M.D.   On: 02/13/2014 02:45     EKG Interpretation None     Patient has small avulsion fracture of the joint.  The left great, toe.  She's been buddy taped, and placed in a postop shoe.  She been instructed to wear these were the least the next 2 weeks, and then as needed.  Thereafter.  She been given a prescription for Vicodin for severe pain and it instructed to take ibuprofen on a regular basis, and follow up with her primary care physician as needed MDM   Final diagnoses:  Toe fracture, left, closed, initial encounter         Arman Filter, NP 02/13/14 0300  Arman Filter, NP 02/13/14 1610

## 2014-02-17 ENCOUNTER — Ambulatory Visit: Payer: Managed Care, Other (non HMO) | Admitting: Family Medicine

## 2014-03-08 ENCOUNTER — Ambulatory Visit (INDEPENDENT_AMBULATORY_CARE_PROVIDER_SITE_OTHER): Payer: Managed Care, Other (non HMO) | Admitting: Family Medicine

## 2014-03-08 ENCOUNTER — Encounter: Payer: Self-pay | Admitting: Family Medicine

## 2014-03-08 VITALS — BP 121/79 | HR 95 | Temp 98.4°F | Ht 63.0 in | Wt 226.0 lb

## 2014-03-08 DIAGNOSIS — J4520 Mild intermittent asthma, uncomplicated: Secondary | ICD-10-CM

## 2014-03-08 DIAGNOSIS — J0100 Acute maxillary sinusitis, unspecified: Secondary | ICD-10-CM

## 2014-03-08 MED ORDER — ALBUTEROL SULFATE HFA 108 (90 BASE) MCG/ACT IN AERS: 2.0000 | INHALATION_SPRAY | RESPIRATORY_TRACT | Status: DC | PRN

## 2014-03-08 MED ORDER — AMOXICILLIN 875 MG PO TABS: 875.0000 mg | ORAL_TABLET | Freq: Two times a day (BID) | ORAL | Status: DC

## 2014-03-08 NOTE — Progress Notes (Signed)
   Subjective:    Patient ID: Diana Nelson, female    DOB: Dec 04, 1980, 33 y.o.   MRN: [id number]  HPI Here for one week of sinus pressure, HA, PND, and ST. On Advil and Afrin.    Review of Systems  Constitutional: Negative.   HENT: Positive for congestion, postnasal drip and sinus pressure.   Eyes: Negative.   Respiratory: Negative.        Objective:   Physical Exam  Constitutional: She appears well-developed and well-nourished.  HENT:  Right Ear: External ear normal.  Left Ear: External ear normal.  Nose: Nose normal.  Mouth/Throat: Oropharynx is clear and moist.  Eyes: Conjunctivae are normal.  Pulmonary/Chest: Effort normal and breath sounds normal.  Lymphadenopathy:    She has no cervical adenopathy.          Assessment & Plan:  Add Mucinex.

## 2014-03-08 NOTE — Progress Notes (Signed)
Pre visit review using our clinic review tool, if applicable. No additional management support is needed unless otherwise documented below in the visit note. 

## 2014-03-31 ENCOUNTER — Telehealth: Payer: Self-pay | Admitting: Family Medicine

## 2014-03-31 NOTE — Telephone Encounter (Signed)
Pt would like to know if will accept her mother as a pt? Her mom has medicare.

## 2014-04-01 NOTE — Telephone Encounter (Signed)
Sorry but I am too full  

## 2014-04-06 NOTE — Telephone Encounter (Signed)
Pt called and I advised her of Dr Clent RidgesFry decision..Marland Kitchen

## 2014-06-20 ENCOUNTER — Ambulatory Visit (INDEPENDENT_AMBULATORY_CARE_PROVIDER_SITE_OTHER): Payer: Managed Care, Other (non HMO) | Admitting: Family Medicine

## 2014-06-20 ENCOUNTER — Encounter: Payer: Self-pay | Admitting: Family Medicine

## 2014-06-20 VITALS — BP 115/83 | HR 108 | Temp 98.8°F | Ht 63.0 in | Wt 224.0 lb

## 2014-06-20 DIAGNOSIS — J029 Acute pharyngitis, unspecified: Secondary | ICD-10-CM

## 2014-06-20 MED ORDER — CEPHALEXIN 500 MG PO CAPS: 500.0000 mg | ORAL_CAPSULE | Freq: Three times a day (TID) | ORAL | Status: DC

## 2014-06-20 NOTE — Progress Notes (Signed)
Pre visit review using our clinic review tool, if applicable. No additional management support is needed unless otherwise documented below in the visit note. 

## 2014-06-20 NOTE — Progress Notes (Signed)
   Subjective:    Patient ID: Leonette NuttingHolly K D'Amato, female    DOB: 09/03/1980, 34 y.o.   MRN: [id number]  HPI Here for one week of sinus pressure, PND,and ST. No cough or fever.    Review of Systems  Constitutional: Negative.   HENT: Positive for congestion, postnasal drip and sinus pressure.   Eyes: Negative.   Respiratory: Negative.        Objective:   Physical Exam  Constitutional: She appears well-developed and well-nourished.  HENT:  Right Ear: External ear normal.  Left Ear: External ear normal.  Nose: Nose normal.  Mouth/Throat: Oropharynx is clear and moist. No oropharyngeal exudate.  Eyes: Conjunctivae are normal.  Pulmonary/Chest: Effort normal and breath sounds normal.  Lymphadenopathy:    She has no cervical adenopathy.          Assessment & Plan:  Cover with Keflex.

## 2014-08-22 ENCOUNTER — Telehealth: Payer: Self-pay

## 2014-08-22 NOTE — Telephone Encounter (Signed)
[facility] refill request for DIAZEPAM 2MG  TAB

## 2014-08-23 ENCOUNTER — Other Ambulatory Visit: Payer: Self-pay | Admitting: Family Medicine

## 2014-08-23 MED ORDER — DIAZEPAM 2 MG PO TABS: 2.0000 mg | ORAL_TABLET | Freq: Three times a day (TID) | ORAL | Status: DC | PRN

## 2014-08-23 NOTE — Telephone Encounter (Signed)
Call in #[phone] with 5 rf 

## 2014-08-23 NOTE — Telephone Encounter (Signed)
Rx phoned in.   

## 2014-09-27 ENCOUNTER — Encounter: Payer: Self-pay | Admitting: Family Medicine

## 2014-09-27 ENCOUNTER — Ambulatory Visit (INDEPENDENT_AMBULATORY_CARE_PROVIDER_SITE_OTHER): Payer: BLUE CROSS/BLUE SHIELD | Admitting: Family Medicine

## 2014-09-27 VITALS — BP 104/85 | HR 99 | Temp 98.3°F | Ht 63.0 in | Wt 222.0 lb

## 2014-09-27 DIAGNOSIS — R197 Diarrhea, unspecified: Secondary | ICD-10-CM | POA: Diagnosis not present

## 2014-09-27 DIAGNOSIS — F411 Generalized anxiety disorder: Secondary | ICD-10-CM

## 2014-09-27 DIAGNOSIS — K219 Gastro-esophageal reflux disease without esophagitis: Secondary | ICD-10-CM

## 2014-09-27 MED ORDER — OMEPRAZOLE-SODIUM BICARBONATE 40-1100 MG PO CAPS: 1.0000 | ORAL_CAPSULE | Freq: Every day | ORAL | Status: DC

## 2014-09-27 MED ORDER — DIPHENOXYLATE-ATROPINE 2.5-0.025 MG PO TABS: 2.0000 | ORAL_TABLET | Freq: Four times a day (QID) | ORAL | Status: DC | PRN

## 2014-09-27 MED ORDER — RIZATRIPTAN BENZOATE 10 MG PO TABS: 10.0000 mg | ORAL_TABLET | ORAL | Status: DC | PRN

## 2014-09-27 NOTE — Progress Notes (Signed)
   Subjective:    Patient ID: Diana NuttingHolly K Nelson, female    DOB: 27-Sep-1980, 34 y.o.   MRN: [id number]  HPI Here for 2 weeks of intermittent epigastric pains, along with generalized abdominal cramping and diarrhea. No nausea or vomiting, no fever. She has been under a lot of stress lately. She has been eating a bland diet.    Review of Systems  Constitutional: Negative.   Respiratory: Negative.   Cardiovascular: Negative.   Gastrointestinal: Positive for abdominal pain and diarrhea. Negative for nausea, vomiting, constipation, blood in stool, abdominal distention, anal bleeding and rectal pain.       Objective:   Physical Exam  Constitutional: She appears well-developed and well-nourished. No distress.  Cardiovascular: Normal rate, regular rhythm, normal heart sounds and intact distal pulses.   Pulmonary/Chest: Effort normal and breath sounds normal. h Abdominal: Soft. Bowel sounds are normal. She exhibits no distension and no mass. There is no rebound and no guarding.  Mild epigastric tenderness           Assessment & Plan:  Her diarrhea is functional and probably a consequence of her anxiety. Use Lomotil prn. Switch to full strength Zegerid to control the GERD.

## 2014-09-27 NOTE — Progress Notes (Signed)
Pre visit review using our clinic review tool, if applicable. No additional management support is needed unless otherwise documented below in the visit note. 

## 2015-01-13 ENCOUNTER — Encounter: Payer: Self-pay | Admitting: Family Medicine

## 2015-01-13 ENCOUNTER — Ambulatory Visit (INDEPENDENT_AMBULATORY_CARE_PROVIDER_SITE_OTHER): Payer: BLUE CROSS/BLUE SHIELD | Admitting: Family Medicine

## 2015-01-13 VITALS — BP 133/101 | HR 107 | Temp 98.5°F | Ht 63.0 in | Wt 234.0 lb

## 2015-01-13 DIAGNOSIS — I10 Essential (primary) hypertension: Secondary | ICD-10-CM | POA: Diagnosis not present

## 2015-01-13 LAB — BASIC METABOLIC PANEL
BUN: 10 mg/dL (ref 6–23)
CHLORIDE: 101 meq/L (ref 96–112)
CO2: 26 mEq/L (ref 19–32)
Calcium: 9.4 mg/dL (ref 8.4–10.5)
Creatinine, Ser: 0.75 mg/dL (ref 0.40–1.20)
GFR: 92.79 mL/min (ref >60.00)
Glucose, Bld: 101 mg/dL — ABNORMAL HIGH (ref 70–99)
POTASSIUM: 3.9 meq/L (ref 3.5–5.1)
Sodium: 138 mEq/L (ref 135–145)

## 2015-01-13 LAB — CBC WITH DIFFERENTIAL/PLATELET
BASOS ABS: 0.1 10*3/uL (ref 0.0–0.1)
Basophils Relative: 0.8 % (ref 0.0–3.0)
Eosinophils Absolute: 0.1 10*3/uL (ref 0.0–0.7)
Eosinophils Relative: 0.8 % (ref 0.0–5.0)
HCT: 38.9 % (ref 36.0–46.0)
Hemoglobin: 12.9 g/dL (ref 12.0–15.0)
LYMPHS ABS: 2.1 10*3/uL (ref 0.7–4.0)
Lymphocytes Relative: 16.6 % (ref 12.0–46.0)
MCHC: 33.1 g/dL (ref 30.0–36.0)
MCV: 85.5 fl (ref 78.0–100.0)
MONO ABS: 0.8 10*3/uL (ref 0.1–1.0)
Monocytes Relative: 6.2 % (ref 3.0–12.0)
Neutro Abs: 9.8 10*3/uL — ABNORMAL HIGH (ref 1.4–7.7)
Neutrophils Relative %: 75.6 % (ref 43.0–77.0)
Platelets: 278 10*3/uL (ref 150.0–400.0)
RBC: 4.55 Mil/uL (ref 3.87–5.11)
RDW: 14.2 % (ref 11.5–15.5)
WBC: 12.9 10*3/uL — ABNORMAL HIGH (ref 4.0–10.5)

## 2015-01-13 LAB — HEPATIC FUNCTION PANEL
ALBUMIN: 4.4 g/dL (ref 3.5–5.2)
ALT: 11 U/L (ref 0–35)
AST: 11 U/L (ref 0–37)
Alkaline Phosphatase: 110 U/L (ref 39–117)
Bilirubin, Direct: 0.1 mg/dL (ref 0.0–0.3)
Total Bilirubin: 0.3 mg/dL (ref 0.2–1.2)
Total Protein: 7.5 g/dL (ref 6.0–8.3)

## 2015-01-13 LAB — TSH: TSH: 2.64 u[IU]/mL (ref 0.35–4.50)

## 2015-01-13 MED ORDER — LISINOPRIL-HYDROCHLOROTHIAZIDE 10-12.5 MG PO TABS: 1.0000 | ORAL_TABLET | Freq: Every day | ORAL | Status: DC

## 2015-01-13 NOTE — Progress Notes (Signed)
   Subjective:    Patient ID: Diana Nelson, female    DOB: [birth year], 34 y.o.   MRN: [id number]  HPI Here for elevated BP readings. She has been going to he dentist a lot recently and her BP as been creeping up. She has had readings as high as 220/118 at the dental office. She admits to feeling stressed lately but she denies SOB or chest pain or HA. She has put on a lot of weight as well.    Review of Systems  Constitutional: Negative.   Eyes: Negative.   Respiratory: Negative.   Cardiovascular: Negative.   Endocrine: Negative.   Neurological: Negative.        Objective:   Physical Exam  Constitutional: She is oriented to person, place, and time.  Obese   Neck: No thyromegaly present.  Cardiovascular: Normal rate, regular rhythm, normal heart sounds and intact distal pulses.   Pulmonary/Chest: Effort normal and breath sounds normal. No respiratory distress. She has no wheezes. She has no rales.  Musculoskeletal: She exhibits no edema.  Lymphadenopathy:    She has no cervical adenopathy.  Neurological: She is alert and oriented to person, place, and time.          Assessment & Plan:  HTN. She needs to lose weight and we discussed diet and exercise advice. Start on Lisinopril HCT. Get labs today. Recheck in 3 weeks

## 2015-01-13 NOTE — Progress Notes (Signed)
Pre visit review using our clinic review tool, if applicable. No additional management support is needed unless otherwise documented below in the visit note. 

## 2015-01-17 ENCOUNTER — Encounter: Payer: Self-pay | Admitting: Family Medicine

## 2015-01-17 NOTE — Telephone Encounter (Signed)
I spoke with pt and gave results.  

## 2015-01-17 NOTE — Telephone Encounter (Signed)
Please tell her they were normal.

## 2015-02-06 ENCOUNTER — Ambulatory Visit (INDEPENDENT_AMBULATORY_CARE_PROVIDER_SITE_OTHER): Payer: BLUE CROSS/BLUE SHIELD | Admitting: Family Medicine

## 2015-02-06 ENCOUNTER — Encounter: Payer: Self-pay | Admitting: Family Medicine

## 2015-02-06 VITALS — BP 110/84 | HR 99 | Temp 98.8°F | Ht 63.0 in | Wt 226.0 lb

## 2015-02-06 DIAGNOSIS — I10 Essential (primary) hypertension: Secondary | ICD-10-CM

## 2015-02-06 DIAGNOSIS — M545 Low back pain, unspecified: Secondary | ICD-10-CM

## 2015-02-06 MED ORDER — FLUTICASONE PROPIONATE 50 MCG/ACT NA SUSP: 2.0000 | Freq: Every day | NASAL | Status: DC

## 2015-02-06 NOTE — Progress Notes (Signed)
   Subjective:    Patient ID: Diana Nelson, female    DOB: [birth year], 34 y.o.   MRN: [id number]  HPI Here to follow up on HTN. She tolerates the medication well and her BP has come down nicely. She feels better and she has lost 8 lbs of weight.    Review of Systems  Constitutional: Negative.   Respiratory: Negative.   Cardiovascular: Negative.   Musculoskeletal: Positive for back pain.  Neurological: Negative.        Objective:   Physical Exam  Constitutional: She is oriented to person, place, and time. She appears well-developed and well-nourished.  Cardiovascular: Normal rate, regular rhythm, normal heart sounds and intact distal pulses.   Pulmonary/Chest: Effort normal and breath sounds normal.  Neurological: She is alert and oriented to person, place, and time.          Assessment & Plan:  Her HTN is now well controlled. She will continue to diet and exercise.

## 2015-02-06 NOTE — Progress Notes (Signed)
Pre visit review using our clinic review tool, if applicable. No additional management support is needed unless otherwise documented below in the visit note. 

## 2015-03-01 ENCOUNTER — Other Ambulatory Visit: Payer: Self-pay | Admitting: Family Medicine

## 2015-03-02 NOTE — Telephone Encounter (Signed)
Call in #[phone] with 5 rf 

## 2015-03-19 ENCOUNTER — Other Ambulatory Visit: Payer: Self-pay | Admitting: Family Medicine

## 2015-04-26 ENCOUNTER — Other Ambulatory Visit: Payer: Self-pay | Admitting: Family Medicine

## 2015-05-02 ENCOUNTER — Ambulatory Visit (INDEPENDENT_AMBULATORY_CARE_PROVIDER_SITE_OTHER): Payer: BLUE CROSS/BLUE SHIELD | Admitting: Family Medicine

## 2015-05-02 ENCOUNTER — Encounter: Payer: Self-pay | Admitting: Family Medicine

## 2015-05-02 VITALS — BP 122/78 | HR 90 | Temp 98.2°F | Ht 63.0 in | Wt 220.0 lb

## 2015-05-02 DIAGNOSIS — I10 Essential (primary) hypertension: Secondary | ICD-10-CM | POA: Diagnosis not present

## 2015-05-02 MED ORDER — LISINOPRIL-HYDROCHLOROTHIAZIDE 10-12.5 MG PO TABS: 1.0000 | ORAL_TABLET | Freq: Every day | ORAL | Status: DC

## 2015-05-02 NOTE — Progress Notes (Signed)
   Subjective:    Patient ID: Diana NuttingHolly K Nelson, female    DOB: 1980-12-14, 34 y.o.   MRN: [id number]  HPI Here to follow up on HTN. Her BP is stable at home and she feels well. She has lost 6 more lbs.   Review of Systems  Constitutional: Negative.   Respiratory: Negative.   Cardiovascular: Negative.   Neurological: Negative.        Objective:   Physical Exam  Constitutional: She is oriented to person, place, and time. She appears well-developed and well-nourished.  Neck: No thyromegaly present.  Cardiovascular: Normal rate, regular rhythm, normal heart sounds and intact distal pulses.   Pulmonary/Chest: Effort normal and breath sounds normal.  Musculoskeletal: She exhibits no edema.  Lymphadenopathy:    She has no cervical adenopathy.  Neurological: She is alert and oriented to person, place, and time.          Assessment & Plan:  Her HTN is under good control. Recheck in 6 months

## 2015-05-02 NOTE — Progress Notes (Signed)
Pre visit review using our clinic review tool, if applicable. No additional management support is needed unless otherwise documented below in the visit note. 

## 2015-06-06 ENCOUNTER — Ambulatory Visit (INDEPENDENT_AMBULATORY_CARE_PROVIDER_SITE_OTHER): Payer: BLUE CROSS/BLUE SHIELD | Admitting: Family Medicine

## 2015-06-06 ENCOUNTER — Encounter: Payer: Self-pay | Admitting: Family Medicine

## 2015-06-06 VITALS — BP 114/84 | HR 105 | Temp 98.6°F | Ht 63.0 in | Wt 223.0 lb

## 2015-06-06 DIAGNOSIS — J309 Allergic rhinitis, unspecified: Secondary | ICD-10-CM

## 2015-06-06 MED ORDER — METHYLPREDNISOLONE 4 MG PO TBPK: ORAL_TABLET | ORAL | Status: DC

## 2015-06-06 NOTE — Progress Notes (Signed)
   Subjective:    Patient ID: Diana Nelson, female    DOB: 09-23-1980, 35 y.o.   MRN: [id number]  HPI Here for one week of sinus pressure and nasal congestion. No fever or ST or cough. Using Flonase daily.    Review of Systems  Constitutional: Negative.   HENT: Positive for congestion, postnasal drip and sinus pressure. Negative for ear pain and sore throat.   Eyes: Negative.   Respiratory: Negative.        Objective:   Physical Exam  Constitutional: She appears well-developed and well-nourished.  HENT:  Right Ear: External ear normal.  Left Ear: External ear normal.  Nose: Nose normal.  Mouth/Throat: Oropharynx is clear and moist.  Eyes: Conjunctivae are normal.  Neck: No thyromegaly present.  Pulmonary/Chest: Effort normal and breath sounds normal.  Lymphadenopathy:    She has no cervical adenopathy.          Assessment & Plan:  Allergic sinus congestion. Try a Medrol dose pack

## 2015-06-06 NOTE — Progress Notes (Signed)
Pre visit review using our clinic review tool, if applicable. No additional management support is needed unless otherwise documented below in the visit note. 

## 2015-06-23 ENCOUNTER — Other Ambulatory Visit: Payer: Self-pay | Admitting: Family Medicine

## 2015-09-07 ENCOUNTER — Encounter: Payer: Self-pay | Admitting: Family Medicine

## 2015-09-07 ENCOUNTER — Ambulatory Visit (INDEPENDENT_AMBULATORY_CARE_PROVIDER_SITE_OTHER): Payer: Managed Care, Other (non HMO) | Admitting: Family Medicine

## 2015-09-07 VITALS — BP 122/70 | HR 84 | Temp 98.5°F | Ht 63.0 in | Wt 224.0 lb

## 2015-09-07 DIAGNOSIS — I10 Essential (primary) hypertension: Secondary | ICD-10-CM | POA: Diagnosis not present

## 2015-09-07 DIAGNOSIS — K219 Gastro-esophageal reflux disease without esophagitis: Secondary | ICD-10-CM | POA: Diagnosis not present

## 2015-09-07 DIAGNOSIS — G43809 Other migraine, not intractable, without status migrainosus: Secondary | ICD-10-CM

## 2015-09-07 DIAGNOSIS — F411 Generalized anxiety disorder: Secondary | ICD-10-CM | POA: Diagnosis not present

## 2015-09-07 MED ORDER — DIAZEPAM 2 MG PO TABS: 2.0000 mg | ORAL_TABLET | Freq: Three times a day (TID) | ORAL | Status: DC | PRN

## 2015-09-07 MED ORDER — RIZATRIPTAN BENZOATE 10 MG PO TABS: 10.0000 mg | ORAL_TABLET | ORAL | Status: DC | PRN

## 2015-09-07 MED ORDER — OMEPRAZOLE-SODIUM BICARBONATE 40-1100 MG PO CAPS: 1.0000 | ORAL_CAPSULE | Freq: Every day | ORAL | Status: DC

## 2015-09-07 MED ORDER — SCOPOLAMINE 1 MG/3DAYS TD PT72: 1.0000 | MEDICATED_PATCH | TRANSDERMAL | Status: DC

## 2015-09-07 NOTE — Progress Notes (Signed)
Pre visit review using our clinic review tool, if applicable. No additional management support is needed unless otherwise documented below in the visit note. 

## 2015-09-07 NOTE — Progress Notes (Signed)
   Subjective:    Patient ID: Diana Nelson, female    DOB: 05/01/1981, 35 y.o.   MRN: [id number]  HPI Here to follow up on anxiety and HTN, and for advice. She will be leaving soon on a cruise to [place] and she wants to use something to prevent sea sickness. She has been feeling well.   Review of Systems  Constitutional: Negative.   Respiratory: Negative.   Cardiovascular: Negative.   Neurological: Negative.   Psychiatric/Behavioral: Negative.        Objective:   Physical Exam  Constitutional: She is oriented to person, place, and time. She appears well-developed and well-nourished.  Neck: No thyromegaly present.  Cardiovascular: Normal rate, regular rhythm, normal heart sounds and intact distal pulses.   Pulmonary/Chest: Effort normal and breath sounds normal.  Lymphadenopathy:    She has no cervical adenopathy.  Neurological: She is alert and oriented to person, place, and time.  Psychiatric: She has a normal mood and affect. Her behavior is normal. Thought content normal.          Assessment & Plan:  HTN is stable. Her anxiety is stable. The migraines are stable. Asthma is stable. Try scopolamine patches for the cruise.  Nelwyn SalisburyFRY,Jaecion Dempster A, MD

## 2015-09-14 ENCOUNTER — Other Ambulatory Visit: Payer: Self-pay | Admitting: Family Medicine

## 2015-10-24 ENCOUNTER — Ambulatory Visit (INDEPENDENT_AMBULATORY_CARE_PROVIDER_SITE_OTHER): Payer: BLUE CROSS/BLUE SHIELD | Admitting: Family Medicine

## 2015-10-24 ENCOUNTER — Encounter: Payer: Self-pay | Admitting: Family Medicine

## 2015-10-24 VITALS — BP 130/80 | HR 119 | Temp 98.8°F | Resp 20 | Ht 63.0 in | Wt 215.0 lb

## 2015-10-24 DIAGNOSIS — L509 Urticaria, unspecified: Secondary | ICD-10-CM | POA: Diagnosis not present

## 2015-10-24 MED ORDER — METHYLPREDNISOLONE 4 MG PO TBPK: ORAL_TABLET | ORAL | Status: DC

## 2015-10-24 NOTE — Progress Notes (Signed)
   Subjective:    Patient ID: Diana Nelson, female    DOB: March 05, 1981, 35 y.o.   MRN: [id number]  HPI Here for 5 days of an itchy rash all over the body. No SOB. Taking Claritin 10 mg a day. She has had hives several times before.    Review of Systems  Constitutional: Negative.   Respiratory: Negative.   Cardiovascular: Negative.   Skin: Positive for rash.       Objective:   Physical Exam  Constitutional: She appears well-developed and well-nourished.  Cardiovascular: Normal rate, regular rhythm, normal heart sounds and intact distal pulses.   Pulmonary/Chest: Effort normal and breath sounds normal.  Skin:  Diffuse red urticarial rash on arms and legs and trunk          Assessment & Plan:  Hives. We will increase the Claritin to 10 mg bid, add Zantac 150 mg bid, and add a Medrol dose pack. Recheck prn.  Nelwyn SalisburyFRY,Mariany Mackintosh A, Diana Nelson

## 2015-10-24 NOTE — Progress Notes (Signed)
Pre visit review using our clinic review tool, if applicable. No additional management support is needed unless otherwise documented below in the visit note. 

## 2015-12-12 ENCOUNTER — Other Ambulatory Visit: Payer: Self-pay | Admitting: General Practice

## 2015-12-12 ENCOUNTER — Telehealth: Payer: Self-pay | Admitting: Family Medicine

## 2015-12-12 MED ORDER — LAMOTRIGINE 25 MG PO TABS: 25.0000 mg | ORAL_TABLET | Freq: Every day | ORAL | 1 refills | Status: DC

## 2015-12-12 MED ORDER — LAMOTRIGINE 25 MG PO TABS: 50.0000 mg | ORAL_TABLET | Freq: Every day | ORAL | 1 refills | Status: DC

## 2015-12-12 NOTE — Telephone Encounter (Signed)
Call in Lamictal 25 mg to take 2 tabs (total of 50 mg) every night at bedtoime, #60 with one rf. See me when she is back in town

## 2015-12-12 NOTE — Telephone Encounter (Signed)
I spoke with patient and notified her that Dr. Clent Ridges is calling in Lamictal.  An appointment has been scheduled for Friday, 8/4 @ 10:45.

## 2015-12-12 NOTE — Telephone Encounter (Signed)
Patient Name: Diana Nelson DOB: 1980-09-16 Initial Comment Caller states wanting to talk w/ dr regarding ADD and depresson issues Nurse Assessment Nurse: Yetta Barre, RN, [name] Date/Time (Eastern Time): 12/12/2015 11:03:37 AM Confirm and document reason for call. If symptomatic, describe symptoms. You must click the next button to save text entered. ---Caller states she is out of town. She has been having racing thoughts and trouble focusing. She is also having depression. Has the patient traveled out of the country within the last 30 days? ---Not Applicable Does the patient have any new or worsening symptoms? ---Yes Will a triage be completed? ---Yes Related visit to physician within the last 2 weeks? ---No Does the PT have any chronic conditions? (i.e. diabetes, asthma, etc.) ---Yes List chronic conditions. ---HTN, GERD, Anxiety\panic attacks Is the patient pregnant or possibly pregnant? (Ask all females between the ages of 41-55) ---No Is this a behavioral health or substance abuse call? ---Yes Are you having any thoughts or feelings of harming or killing yourself or someone else? ---No Are you currently experiencing any physical discomfort that you think may be related to the use of alcohol or other drugs? (use substance abuse or alcohol abuse guidelines. These include withdrawal symptoms) ---No Do you worry that you may be hearing or seeing things that others do not? ---No Do you take medications for your condition(s)? ---Yes List medications here. ---Valium Guidelines Guideline Title Affirmed Question Affirmed Notes Depression [1] Depression AND [2] worsening (e.g.,sleeping poorly, less able to do activities of daily living) Final Disposition User See Physician within 24 Hours Jones, RN, [name] Comments Caller is out of state and will be for atleast 1-2 more months. She would like to discuss her symptoms with Dr. Clent Ridges directly and requests that he contact her. She is  wanting to know if there is something he can call in to help with the symptoms or what other recommendations he may have. Told caller I would forward the message to the provider but if she did not get a call back or he felt like she needed to be seen first, she would need to go to UC. Referrals GO TO FACILITY REFUSED Disagree/Comply: Comply

## 2015-12-13 NOTE — Telephone Encounter (Signed)
Script had already been called in yesterday by [place].

## 2015-12-15 ENCOUNTER — Encounter: Payer: Self-pay | Admitting: Family Medicine

## 2015-12-15 NOTE — Telephone Encounter (Signed)
Tell her I don't know what the pharmacist is talking about. She should try the Lamictal as we discussed. It will not affect her eczema.

## 2015-12-22 ENCOUNTER — Ambulatory Visit: Payer: Managed Care, Other (non HMO) | Admitting: Family Medicine

## 2015-12-29 ENCOUNTER — Encounter: Payer: Self-pay | Admitting: Family Medicine

## 2015-12-29 ENCOUNTER — Ambulatory Visit (INDEPENDENT_AMBULATORY_CARE_PROVIDER_SITE_OTHER): Payer: BLUE CROSS/BLUE SHIELD | Admitting: Family Medicine

## 2015-12-29 VITALS — BP 101/74 | HR 101 | Temp 99.0°F | Ht 63.0 in | Wt 202.0 lb

## 2015-12-29 DIAGNOSIS — F900 Attention-deficit hyperactivity disorder, predominantly inattentive type: Secondary | ICD-10-CM

## 2015-12-29 DIAGNOSIS — F418 Other specified anxiety disorders: Secondary | ICD-10-CM | POA: Diagnosis not present

## 2015-12-29 DIAGNOSIS — F909 Attention-deficit hyperactivity disorder, unspecified type: Secondary | ICD-10-CM | POA: Insufficient documentation

## 2015-12-29 MED ORDER — LAMOTRIGINE 100 MG PO TABS: 100.0000 mg | ORAL_TABLET | Freq: Every day | ORAL | 5 refills | Status: DC

## 2015-12-29 MED ORDER — METHYLPHENIDATE HCL ER (LA) 20 MG PO CP24: 20.0000 mg | ORAL_CAPSULE | ORAL | 0 refills | Status: DC

## 2015-12-29 NOTE — Progress Notes (Signed)
Pre visit review using our clinic review tool, if applicable. No additional management support is needed unless otherwise documented below in the visit note. 

## 2015-12-29 NOTE — Progress Notes (Signed)
   Subjective:    Patient ID: Diana Nelson, female    DOB: 11/05/1980, 35 y.o.   MRN: [id number]  HPI Here to follow up on anxiety and depression and to ask about treating her short attention span. All her life she has struggled to keep her focus on tasks and on completing tasks on time. This has gotten worse lately and it is affecting her job. She tried some Ritalin she borrowed from a friend and it worked very well for her. She says the Lamictal has helped her mood swings but she thinks a larger dose would be better for her.    Review of Systems  Constitutional: Negative.   Respiratory: Negative.   Cardiovascular: Negative.   Neurological: Negative.   Psychiatric/Behavioral: Positive for decreased concentration and dysphoric mood. Negative for agitation, behavioral problems, confusion, hallucinations, self-injury, sleep disturbance and suicidal ideas. The patient is nervous/anxious.        Objective:   Physical Exam  Constitutional: She is oriented to person, place, and time. She appears well-developed and well-nourished.  Cardiovascular: Normal rate, regular rhythm, normal heart sounds and intact distal pulses.   Pulmonary/Chest: Effort normal and breath sounds normal.  Neurological: She is alert and oriented to person, place, and time. No cranial nerve deficit. Coordination normal.  Psychiatric: She has a normal mood and affect. Her behavior is normal. Judgment and thought content normal.          Assessment & Plan:  For the mood swings we will increase Lamictal to 100 mg daily. For the ADHD she will try Ritalin LA 20 mg each am.  Nelwyn SalisburyFRY,STEPHEN A, MD

## 2015-12-31 ENCOUNTER — Encounter: Payer: Self-pay | Admitting: Family Medicine

## 2016-01-05 NOTE — Telephone Encounter (Signed)
Try Topamax instead. Call in 25 mg tabs to take one qhs, #30 with 2 rf.

## 2016-01-08 ENCOUNTER — Encounter: Payer: Self-pay | Admitting: Family Medicine

## 2016-01-08 ENCOUNTER — Ambulatory Visit (INDEPENDENT_AMBULATORY_CARE_PROVIDER_SITE_OTHER): Payer: BLUE CROSS/BLUE SHIELD | Admitting: Family Medicine

## 2016-01-08 VITALS — BP 102/72 | HR 105 | Temp 99.3°F | Wt 199.4 lb

## 2016-01-08 DIAGNOSIS — K219 Gastro-esophageal reflux disease without esophagitis: Secondary | ICD-10-CM

## 2016-01-08 DIAGNOSIS — G43809 Other migraine, not intractable, without status migrainosus: Secondary | ICD-10-CM

## 2016-01-08 DIAGNOSIS — R350 Frequency of micturition: Secondary | ICD-10-CM | POA: Diagnosis not present

## 2016-01-08 DIAGNOSIS — O0001 Abdominal pregnancy with intrauterine pregnancy: Secondary | ICD-10-CM

## 2016-01-08 DIAGNOSIS — F900 Attention-deficit hyperactivity disorder, predominantly inattentive type: Secondary | ICD-10-CM

## 2016-01-08 DIAGNOSIS — J4520 Mild intermittent asthma, uncomplicated: Secondary | ICD-10-CM

## 2016-01-08 DIAGNOSIS — N390 Urinary tract infection, site not specified: Secondary | ICD-10-CM | POA: Diagnosis not present

## 2016-01-08 DIAGNOSIS — I10 Essential (primary) hypertension: Secondary | ICD-10-CM

## 2016-01-08 DIAGNOSIS — N912 Amenorrhea, unspecified: Secondary | ICD-10-CM | POA: Diagnosis not present

## 2016-01-08 LAB — POC URINALSYSI DIPSTICK (AUTOMATED)
Bilirubin, UA: NEGATIVE
Glucose, UA: NEGATIVE
Ketones, UA: NEGATIVE
Nitrite, UA: NEGATIVE
Protein, UA: NEGATIVE
RBC UA: NEGATIVE
UROBILINOGEN UA: NEGATIVE
pH, UA: 6

## 2016-01-08 LAB — OB RESULTS CONSOLE RPR: RPR: NONREACTIVE

## 2016-01-08 LAB — OB RESULTS CONSOLE HEPATITIS B SURFACE ANTIGEN: HEP B S AG: NEGATIVE

## 2016-01-08 LAB — OB RESULTS CONSOLE ABO/RH: RH TYPE: POSITIVE

## 2016-01-08 LAB — POCT URINE PREGNANCY: Preg Test, Ur: POSITIVE — AB

## 2016-01-08 LAB — OB RESULTS CONSOLE ANTIBODY SCREEN: Antibody Screen: NEGATIVE

## 2016-01-08 LAB — OB RESULTS CONSOLE HIV ANTIBODY (ROUTINE TESTING): HIV: NONREACTIVE

## 2016-01-08 LAB — OB RESULTS CONSOLE RUBELLA ANTIBODY, IGM: Rubella: IMMUNE

## 2016-01-08 LAB — OB RESULTS CONSOLE GC/CHLAMYDIA
CHLAMYDIA, DNA PROBE: NEGATIVE
Gonorrhea: NEGATIVE

## 2016-01-08 MED ORDER — CEPHALEXIN 500 MG PO CAPS: 500.0000 mg | ORAL_CAPSULE | Freq: Three times a day (TID) | ORAL | 0 refills | Status: DC

## 2016-01-08 MED ORDER — METHYLDOPA 250 MG PO TABS: 250.0000 mg | ORAL_TABLET | Freq: Two times a day (BID) | ORAL | 11 refills | Status: DC

## 2016-01-08 NOTE — Addendum Note (Signed)
Addended by: Baldwin CrownJOHNSON, SHAQUETTA D on: 01/08/2016 03:40 PM   Modules accepted: Orders

## 2016-01-08 NOTE — Progress Notes (Signed)
   Subjective:    Patient ID: Diana Nelson, female    DOB: 07/21/1980, 35 y.o.   MRN: [id number]  HPI Here asking for advice for a recent diagnosis of pregnancy. She has taken some at home pregnancy tests that were positive and she saw her ObGYN office this am which confirmed the pregnancy. She is about 4 weeks into it. She feels fine, although her UA here indicates a UTI. Her asthma has been stable as has her HTN. She wants to know what medications to take since she will not see her OB doctor for a few more weeks.    Review of Systems  Constitutional: Negative.   Respiratory: Negative.   Cardiovascular: Negative.   Gastrointestinal: Negative.   Genitourinary: Negative.   Neurological: Negative.        Objective:   Physical Exam  Constitutional: She is oriented to person, place, and time. She appears well-developed and well-nourished.  Neck: No thyromegaly present.  Cardiovascular: Normal rate, regular rhythm, normal heart sounds and intact distal pulses.   Pulmonary/Chest: Effort normal and breath sounds normal.  Lymphadenopathy:    She has no cervical adenopathy.  Neurological: She is alert and oriented to person, place, and time.          Assessment & Plan:  Recent confirmation of pregnancy. She knows not to use tobacco or alcohol. For her HTN we will stop the Lisinopril HCT and start on Methyldopa 250 mg bid. She will stop using Valium and Maxalt and Ritalin. She will use her albuterol inhaler if needed. Treat the UTI with Keflex and culture the sample. Recheck the BP at her OB office in 2 weeks.  Nelwyn SalisburyFRY,Fleet Higham A, MD

## 2016-01-09 LAB — URINE CULTURE: Organism ID, Bacteria: 10000

## 2016-01-11 NOTE — Telephone Encounter (Signed)
Actually now that she is pregnant, she cannot take any of these medications. Disregard my note about Topamax.

## 2016-01-11 NOTE — Telephone Encounter (Signed)
I spoke with pt and went over below information. 

## 2016-02-07 ENCOUNTER — Telehealth: Payer: Self-pay | Admitting: Family Medicine

## 2016-02-07 NOTE — Telephone Encounter (Signed)
Pt would like us to resend  methyldopa (ALDOMET) 250 MG tablet  90 day  [facility]

## 2016-02-08 MED ORDER — METHYLDOPA 250 MG PO TABS: 250.0000 mg | ORAL_TABLET | Freq: Two times a day (BID) | ORAL | 0 refills | Status: DC

## 2016-02-08 NOTE — Telephone Encounter (Signed)
Rx sent 

## 2016-04-05 ENCOUNTER — Other Ambulatory Visit: Payer: Self-pay | Admitting: Family Medicine

## 2016-04-05 NOTE — Telephone Encounter (Signed)
[facility] [address].

## 2016-04-09 ENCOUNTER — Other Ambulatory Visit: Payer: Self-pay | Admitting: Family Medicine

## 2016-04-09 MED ORDER — ALBUTEROL SULFATE HFA 108 (90 BASE) MCG/ACT IN AERS: INHALATION_SPRAY | RESPIRATORY_TRACT | 3 refills | Status: DC

## 2016-05-07 ENCOUNTER — Encounter: Payer: Self-pay | Admitting: Adult Health

## 2016-05-07 ENCOUNTER — Ambulatory Visit (INDEPENDENT_AMBULATORY_CARE_PROVIDER_SITE_OTHER): Payer: BLUE CROSS/BLUE SHIELD | Admitting: Adult Health

## 2016-05-07 VITALS — BP 110/64 | Temp 97.9°F | Ht 63.0 in | Wt 198.6 lb

## 2016-05-07 DIAGNOSIS — J0140 Acute pansinusitis, unspecified: Secondary | ICD-10-CM

## 2016-05-07 MED ORDER — AMOXICILLIN-POT CLAVULANATE 875-125 MG PO TABS: 1.0000 | ORAL_TABLET | Freq: Two times a day (BID) | ORAL | 0 refills | Status: DC

## 2016-05-07 NOTE — Progress Notes (Signed)
Subjective:    Patient ID: Leonette NuttingHolly K D'Amato, female    DOB: 11/14/1980, 35 y.o.   MRN: [id number]  Sinusitis  This is a new problem. The current episode started 1 to 4 weeks ago (2 weeks ). There has been no fever. Associated symptoms include congestion, coughing, ear pain, shortness of breath and sinus pressure. Pertinent negatives include no sore throat. Past treatments include oral decongestants and spray decongestants (Mucinex, Motrin, Flonase and Claritin ). The treatment provided no relief.      Review of Systems  HENT: Positive for congestion, ear pain, postnasal drip, rhinorrhea, sinus pain and sinus pressure. Negative for sore throat and trouble swallowing.   Respiratory: Positive for cough and shortness of breath.   Cardiovascular: Negative.   Gastrointestinal: Negative.   All other systems reviewed and are negative.  Past Medical History:  Diagnosis Date  . Allergy   . Asthma   . Migraines     Social History   Social History  . Marital status: Married    Spouse name: N/A  . Number of children: N/A  . Years of education: N/A   Occupational History  . Not on file.   Social History Main Topics  . Smoking status: Former Smoker    Packs/day: 1.00    Types: Cigarettes    Quit date: 06/16/2012  . Smokeless tobacco: Never Used  . Alcohol use No  . Drug use: No  . Sexual activity: Not on file   Other Topics Concern  . Not on file   Social History Narrative  . No narrative on file    Past Surgical History:  Procedure Laterality Date  . knee surgeries     x3  . SPINAL FUSION     2006   . TONSILLECTOMY      No family history on file.  Allergies  Allergen Reactions  . Azithromycin     REACTION: nausea  . Lamictal [Lamotrigine] Rash    Current Outpatient Prescriptions on File Prior to Visit  Medication Sig Dispense Refill  . albuterol (PROVENTIL HFA) 108 (90 Base) MCG/ACT inhaler INHALE 2 PUFFS INTO THE LUNGS EVERY 4 (FOUR) HOURS AS NEEDED FOR  WHEEZING OR SHORTNESS OF BREATH. 6.7 g 3  . fluticasone (FLONASE) 50 MCG/ACT nasal spray Place 2 sprays into both nostrils daily. 16 g 6  . ibuprofen (ADVIL,MOTRIN) 200 MG tablet Take 200 mg by mouth every 6 (six) hours as needed.    . loratadine (CLARITIN) 10 MG tablet Take 10 mg by mouth at bedtime as needed for allergies.     . methyldopa (ALDOMET) 250 MG tablet Take 1 tablet (250 mg total) by mouth 2 (two) times daily. 180 tablet 0   No current facility-administered medications on file prior to visit.     BP 110/64   Temp 97.9 F (36.6 C) (Oral)   Ht 5\' 3"  (1.6 m)   Wt 198 lb 9.6 oz (90.1 kg)   BMI 35.18 kg/m       Objective:   Physical Exam  Constitutional: She is oriented to person, place, and time. She appears well-developed and well-nourished. No distress.  HENT:  Head: Normocephalic and atraumatic.  Right Ear: Hearing, tympanic membrane, external ear and ear canal normal.  Left Ear: Hearing, tympanic membrane, external ear and ear canal normal.  Nose: Mucosal edema and rhinorrhea present. Right sinus exhibits maxillary sinus tenderness and frontal sinus tenderness. Left sinus exhibits maxillary sinus tenderness and frontal sinus tenderness.  Mouth/Throat: Uvula is  midline, oropharynx is clear and moist and mucous membranes are normal. Mucous membranes are not pale, not dry and not cyanotic. No oropharyngeal exudate, posterior oropharyngeal edema, posterior oropharyngeal erythema or tonsillar abscesses.  Eyes: Conjunctivae and EOM are normal. Pupils are equal, round, and reactive to light. Right eye exhibits no discharge. Left eye exhibits no discharge. No scleral icterus.  Neck: Normal range of motion. Neck supple. No thyromegaly present.  Cardiovascular: Normal rate, regular rhythm, normal heart sounds and intact distal pulses.  Exam reveals no gallop and no friction rub.   No murmur heard. Pulmonary/Chest: Effort normal and breath sounds normal. No respiratory distress. She  has no wheezes. She has no rales. She exhibits no tenderness.  Neurological: She is alert and oriented to person, place, and time.  Skin: Skin is warm and dry. No rash noted. She is not diaphoretic. No erythema. No pallor.  Psychiatric: She has a normal mood and affect. Her behavior is normal. Thought content normal.  Nursing note and vitals reviewed.      Assessment & Plan:  1. Acute non-recurrent pansinusitis - Pregnancy Category B - amoxicillin-clavulanate (AUGMENTIN) 875-125 MG tablet; Take 1 tablet by mouth 2 (two) times daily.  Dispense: 20 tablet; Refill: 0 - Continue with Mucinex and Flonase   Shirline Freesory Lakenya Riendeau, NP

## 2016-05-07 NOTE — Progress Notes (Signed)
Pre visit review using our clinic review tool, if applicable. No additional management support is needed unless otherwise documented below in the visit note. 

## 2016-05-07 NOTE — Patient Instructions (Signed)
I have sent in a prescription for Augmentin to the pharmacy for your sinus infection.   Take as directed. Continue with the Mucinex   General Recommendations:    Please drink plenty of fluids.  Get plenty of rest   Sleep in humidified air  Use saline nasal sprays  Netti pot   OTC Medications:  Decongestants - helps relieve congestion   Flonase (generic fluticasone) or Nasacort (generic triamcinolone) - please make sure to use the "cross-over" technique at a 45 degree angle towards the opposite eye as opposed to straight up the nasal passageway.   Sudafed (generic pseudoephedrine - Note this is the one that is available behind the pharmacy counter); Products with phenylephrine (-PE) may also be used but is often not as effective as pseudoephedrine.   If you have HIGH BLOOD PRESSURE - Coricidin HBP; AVOID any product that is -D as this contains pseudoephedrine which may increase your blood pressure.  Afrin (oxymetazoline) every 6-8 hours for up to 3 days.   Allergies - helps relieve runny nose, itchy eyes and sneezing   Claritin (generic loratidine), Allegra (fexofenidine), or Zyrtec (generic cyrterizine) for runny nose. These medications should not cause drowsiness.  Note - Benadryl (generic diphenhydramine) may be used however may cause drowsiness  Cough -   Delsym or Robitussin (generic dextromethorphan)  Expectorants - helps loosen mucus to ease removal   Mucinex (generic guaifenesin) as directed on the package.  Headaches / General Aches   Tylenol (generic acetaminophen) - DO NOT EXCEED 3 grams (3,000 mg) in a 24 hour time period  Advil/Motrin (generic ibuprofen)   Sore Throat -   Salt water gargle   Chloraseptic (generic benzocaine) spray or lozenges / Sucrets (generic dyclonine)    Sinusitis Sinusitis is redness, soreness, and inflammation of the paranasal sinuses. Paranasal sinuses are air pockets within the bones of your face (beneath the eyes,  the middle of the forehead, or above the eyes). In healthy paranasal sinuses, mucus is able to drain out, and air is able to circulate through them by way of your nose. However, when your paranasal sinuses are inflamed, mucus and air can become trapped. This can allow bacteria and other germs to grow and cause infection. Sinusitis can develop quickly and last only a short time (acute) or continue over a long period (chronic). Sinusitis that lasts for more than 12 weeks is considered chronic.  CAUSES  Causes of sinusitis include:  Allergies.  Structural abnormalities, such as displacement of the cartilage that separates your nostrils (deviated septum), which can decrease the air flow through your nose and sinuses and affect sinus drainage.  Functional abnormalities, such as when the small hairs (cilia) that line your sinuses and help remove mucus do not work properly or are not present. SIGNS AND SYMPTOMS  Symptoms of acute and chronic sinusitis are the same. The primary symptoms are pain and pressure around the affected sinuses. Other symptoms include:  Upper toothache.  Earache.  Headache.  Bad breath.  Decreased sense of smell and taste.  A cough, which worsens when you are lying flat.  Fatigue.  Fever.  Thick drainage from your nose, which often is green and may contain pus (purulent).  Swelling and warmth over the affected sinuses. DIAGNOSIS  Your health care provider will perform a physical exam. During the exam, your health care provider may:  Look in your nose for signs of abnormal growths in your nostrils (nasal polyps).  Tap over the affected sinus to check for  signs of infection.  View the inside of your sinuses (endoscopy) using an imaging device that has a light attached (endoscope). If your health care provider suspects that you have chronic sinusitis, one or more of the following tests may be recommended:  Allergy tests.  Nasal culture. A sample of mucus is  taken from your nose, sent to a lab, and screened for bacteria.  Nasal cytology. A sample of mucus is taken from your nose and examined by your health care provider to determine if your sinusitis is related to an allergy. TREATMENT  Most cases of acute sinusitis are related to a viral infection and will resolve on their own within 10 days. Sometimes medicines are prescribed to help relieve symptoms (pain medicine, decongestants, nasal steroid sprays, or saline sprays).  However, for sinusitis related to a bacterial infection, your health care provider will prescribe antibiotic medicines. These are medicines that will help kill the bacteria causing the infection.  Rarely, sinusitis is caused by a fungal infection. In theses cases, your health care provider will prescribe antifungal medicine. For some cases of chronic sinusitis, surgery is needed. Generally, these are cases in which sinusitis recurs more than 3 times per year, despite other treatments. HOME CARE INSTRUCTIONS   Drink plenty of water. Water helps thin the mucus so your sinuses can drain more easily.  Use a humidifier.  Inhale steam 3 to 4 times a day (for example, sit in the bathroom with the shower running).  Apply a warm, moist washcloth to your face 3 to 4 times a day, or as directed by your health care provider.  Use saline nasal sprays to help moisten and clean your sinuses.  Take medicines only as directed by your health care provider.  If you were prescribed either an antibiotic or antifungal medicine, finish it all even if you start to feel better. SEEK IMMEDIATE MEDICAL CARE IF:  You have increasing pain or severe headaches.  You have nausea, vomiting, or drowsiness.  You have swelling around your face.  You have vision problems.  You have a stiff neck.  You have difficulty breathing. MAKE SURE YOU:   Understand these instructions.  Will watch your condition.  Will get help right away if you are not  doing well or get worse. Document Released: 05/06/2005 Document Revised: 09/20/2013 Document Reviewed: 05/21/2011 [facility] Patient Information 2015 [employer]. This information is not intended to replace advice given to you by your health care provider. Make sure you discuss any questions you have with your health care provider.

## 2016-05-10 ENCOUNTER — Other Ambulatory Visit: Payer: Self-pay | Admitting: Adult Health

## 2016-05-10 ENCOUNTER — Telehealth: Payer: Self-pay | Admitting: Family Medicine

## 2016-05-10 MED ORDER — CEFDINIR 300 MG PO CAPS: 300.0000 mg | ORAL_CAPSULE | Freq: Two times a day (BID) | ORAL | 0 refills | Status: DC

## 2016-05-10 NOTE — Telephone Encounter (Signed)
Patient notified of this and verbalized understanding.  

## 2016-05-10 NOTE — Telephone Encounter (Signed)
Please advise 

## 2016-05-10 NOTE — Telephone Encounter (Signed)
Pt states the amoxicillin-clavulanate (AUGMENTIN) 875-125 MG tablet  Is making her sick, nauseous and would like to try another drug. She just cannot take this anymore. Pt wants to remind she is [redacted] weeks pregnant.  [facility] [address]

## 2016-05-10 NOTE — Telephone Encounter (Signed)
[facility] take twice a day for 7 days.

## 2016-05-20 NOTE — L&D Delivery Note (Signed)
Delivery Note At  a viable female was delivered rapidly via  (Presentation:OA ;  ).  APGAR:5 ,8 ; weight pending  .   Placenta status:complete , . 3V Cord:  with the following complications:nuchal cord x 2 loose reduced .   Anesthesia:  1% lidocaine Episiotomy:  None Lacerations:  1st Suture Repair: 2.0 vicryl Est. Blood Loss (mL):  100cc  Mom to postpartum.  Baby to Couplet care / Skin to Skin.  Henderson Newcomer Prothero 09/10/2016, 2:31 AM

## 2016-05-24 ENCOUNTER — Encounter: Payer: Self-pay | Admitting: Family Medicine

## 2016-05-27 NOTE — Telephone Encounter (Signed)
Tell her that I am not aware of anything else that would be safe for her to take. She could ask her OBGYN this question

## 2016-06-04 ENCOUNTER — Other Ambulatory Visit: Payer: Self-pay | Admitting: Family Medicine

## 2016-07-27 ENCOUNTER — Inpatient Hospital Stay (HOSPITAL_COMMUNITY)
Admission: AD | Admit: 2016-07-27 | Discharge: 2016-07-27 | Disposition: A | Discharge status: Home / Self Care | Payer: BLUE CROSS/BLUE SHIELD | Source: Ambulatory Visit | Attending: Obstetrics and Gynecology | Admitting: Obstetrics and Gynecology

## 2016-07-27 ENCOUNTER — Encounter: Payer: Self-pay | Admitting: Student

## 2016-07-27 DIAGNOSIS — O26893 Other specified pregnancy related conditions, third trimester: Secondary | ICD-10-CM | POA: Insufficient documentation

## 2016-07-27 DIAGNOSIS — F419 Anxiety disorder, unspecified: Secondary | ICD-10-CM | POA: Insufficient documentation

## 2016-07-27 DIAGNOSIS — O9989 Other specified diseases and conditions complicating pregnancy, childbirth and the puerperium: Secondary | ICD-10-CM | POA: Insufficient documentation

## 2016-07-27 DIAGNOSIS — Z3A32 32 weeks gestation of pregnancy: Secondary | ICD-10-CM | POA: Insufficient documentation

## 2016-07-27 DIAGNOSIS — Z87891 Personal history of nicotine dependence: Secondary | ICD-10-CM | POA: Insufficient documentation

## 2016-07-27 DIAGNOSIS — J45909 Unspecified asthma, uncomplicated: Secondary | ICD-10-CM | POA: Insufficient documentation

## 2016-07-27 DIAGNOSIS — S334XXA Traumatic rupture of symphysis pubis, initial encounter: Secondary | ICD-10-CM

## 2016-07-27 DIAGNOSIS — O99343 Other mental disorders complicating pregnancy, third trimester: Secondary | ICD-10-CM | POA: Insufficient documentation

## 2016-07-27 DIAGNOSIS — R102 Pelvic and perineal pain: Secondary | ICD-10-CM | POA: Insufficient documentation

## 2016-07-27 DIAGNOSIS — O99513 Diseases of the respiratory system complicating pregnancy, third trimester: Secondary | ICD-10-CM | POA: Insufficient documentation

## 2016-07-27 HISTORY — DX: Anxiety disorder, unspecified: F41.9

## 2016-07-27 LAB — URINALYSIS, ROUTINE W REFLEX MICROSCOPIC
BILIRUBIN URINE: NEGATIVE
Glucose, UA: NEGATIVE mg/dL
HGB URINE DIPSTICK: NEGATIVE
KETONES UR: NEGATIVE mg/dL
NITRITE: NEGATIVE
PROTEIN: NEGATIVE mg/dL
RBC / HPF: NONE SEEN RBC/hpf (ref 0–5)
SPECIFIC GRAVITY, URINE: 1.013 (ref 1.005–1.030)
pH: 6 (ref 5.0–8.0)

## 2016-07-27 MED ORDER — CYCLOBENZAPRINE HCL 10 MG PO TABS: 10.0000 mg | ORAL_TABLET | Freq: Two times a day (BID) | ORAL | 0 refills | Status: DC | PRN

## 2016-07-27 NOTE — MAU Provider Note (Signed)
History     CSN: [id number]  Arrival date and time: 07/27/16 [id number]   First Provider Initiated Contact with Patient 07/27/16 0827      Chief Complaint  Patient presents with  . Vaginal Pain  . Pelvic Pain   HPI   Ms.Diana Nelson is 36 y.o. female G1P0 [id number] here in MAU with pelvic pain. The pain starts on the left side and radiates to her vagina and around to her lower back. The pain has been there for 1.5 months. She has tried ice packs, tylenol, and warm baths. The pain is keeping her up at night and nothing really takes the pain away.   Denies vaginal bleeding or leaking of fluid.   OB History    Gravida Para Term Preterm AB Living   1             SAB TAB Ectopic Multiple Live Births                  Past Medical History:  Diagnosis Date  . Anxiety   . Asthma   . Migraines     Past Surgical History:  Procedure Laterality Date  . knee surgeries     x3  . SPINAL FUSION     2006   . TONSILLECTOMY      History reviewed. No pertinent family history.  Social History  Substance Use Topics  . Smoking status: Former Smoker    Packs/day: 1.00    Types: Cigarettes    Quit date: 06/16/2012  . Smokeless tobacco: Never Used  . Alcohol use No    Allergies:  Allergies  Allergen Reactions  . Azithromycin     REACTION: nausea  . Lamictal [Lamotrigine] Rash    Prescriptions Prior to Admission  Medication Sig Dispense Refill Last Dose  . albuterol (PROVENTIL HFA) 108 (90 Base) MCG/ACT inhaler INHALE 2 PUFFS INTO THE LUNGS EVERY 4 (FOUR) HOURS AS NEEDED FOR WHEEZING OR SHORTNESS OF BREATH. 6.7 g 3 Taking  . amoxicillin-clavulanate (AUGMENTIN) 875-125 MG tablet Take 1 tablet by mouth 2 (two) times daily. 20 tablet 0   . cefdinir (OMNICEF) 300 MG capsule Take 1 capsule (300 mg total) by mouth 2 (two) times daily. 14 capsule 0   . fluticasone (FLONASE) 50 MCG/ACT nasal spray Place 2 sprays into both nostrils daily. 16 g 6 Taking  . ibuprofen (ADVIL,MOTRIN) 200 MG  tablet Take 200 mg by mouth every 6 (six) hours as needed.   Taking  . loratadine (CLARITIN) 10 MG tablet Take 10 mg by mouth at bedtime as needed for allergies.    Taking  . methyldopa (ALDOMET) 250 MG tablet TAKE 1 TABLET (250 MG TOTAL) BY MOUTH 2 (TWO) TIMES DAILY. 180 tablet 0    Results for orders placed or performed during the hospital encounter of 07/27/16 (from the past 48 hour(s))  Urinalysis, Routine w reflex microscopic     Status: Abnormal   Collection Time: 07/27/16  7:30 AM  Result Value Ref Range   Color, Urine YELLOW YELLOW   APPearance HAZY (A) CLEAR   Specific Gravity, Urine 1.013 1.005 - 1.030   pH 6.0 5.0 - 8.0   Glucose, UA NEGATIVE NEGATIVE mg/dL   Hgb urine dipstick NEGATIVE NEGATIVE   Bilirubin Urine NEGATIVE NEGATIVE   Ketones, ur NEGATIVE NEGATIVE mg/dL   Protein, ur NEGATIVE NEGATIVE mg/dL   Nitrite NEGATIVE NEGATIVE   Leukocytes, UA MODERATE (A) NEGATIVE   RBC / HPF NONE SEEN 0 - 5  RBC/hpf   WBC, UA 0-5 0 - 5 WBC/hpf   Bacteria, UA RARE (A) NONE SEEN   Squamous Epithelial / LPF 6-30 (A) NONE SEEN   Mucous PRESENT    Review of Systems  Constitutional: Negative for fever.  Gastrointestinal: Positive for constipation.  Genitourinary: Negative for dysuria.   Physical Exam   Blood pressure 131/68, pulse 106, temperature 97.7 F (36.5 C), temperature source Oral, resp. rate 18, height 5\' 2"  (1.575 m), weight 210 lb 1.9 oz (95.3 kg).  Physical Exam  Constitutional: She is oriented to person, place, and time. She appears well-developed and well-nourished. No distress.  HENT:  Head: Normocephalic.  Eyes: Pupils are equal, round, and reactive to light.  GI: Soft. Normal appearance. There is tenderness in the suprapubic area and left lower quadrant. There is no rigidity, no rebound and no guarding.  Genitourinary:  Genitourinary Comments: Dilation: Closed Effacement (%): Thick Station: -3 Presentation: Vertex Exam by:: Venia Carbon NP   Musculoskeletal: Normal range of motion.  Neurological: She is alert and oriented to person, place, and time.  Skin: Skin is warm. She is not diaphoretic.  Psychiatric: Her behavior is normal.  Fetal Tracing: Baseline: 125 bpm Variability: Moderate  Accelerations: 15x15 Decelerations: None Toco: none   MAU Course  Procedures  None  MDM  UA Urine culture  Discussed patient with Dr. Charlotta Newton.   Assessment and Plan    A:  1. Symphysis pubis disruption, initial encounter     P:  Discharge home in stable condition RX: flexeril Discussed abdominal support belt Return to MAU as needed, if symptoms worsen   Duane Lope, NP 07/27/2016 10:30 AM

## 2016-07-27 NOTE — MAU Note (Signed)
Patient reports having vaginal pain that is so severe cannot sleep and making her nauseated. Has been going on for some time.

## 2016-07-28 LAB — CULTURE, OB URINE
Culture: <10000 — AB
Special Requests: NORMAL

## 2016-08-19 ENCOUNTER — Encounter: Payer: Self-pay | Admitting: Family Medicine

## 2016-08-19 ENCOUNTER — Ambulatory Visit (INDEPENDENT_AMBULATORY_CARE_PROVIDER_SITE_OTHER): Payer: BLUE CROSS/BLUE SHIELD | Admitting: Family Medicine

## 2016-08-19 VITALS — BP 128/93 | HR 108 | Temp 98.3°F | Ht 62.0 in | Wt 212.0 lb

## 2016-08-19 DIAGNOSIS — J019 Acute sinusitis, unspecified: Secondary | ICD-10-CM | POA: Diagnosis not present

## 2016-08-19 MED ORDER — ALPRAZOLAM 0.25 MG PO TABS: 0.2500 mg | ORAL_TABLET | Freq: Three times a day (TID) | ORAL | 0 refills | Status: DC | PRN

## 2016-08-19 MED ORDER — CEFUROXIME AXETIL 500 MG PO TABS: 500.0000 mg | ORAL_TABLET | Freq: Two times a day (BID) | ORAL | 0 refills | Status: DC

## 2016-08-19 NOTE — Progress Notes (Signed)
   Subjective:    Patient ID: Diana Nelson, female    DOB: 09/02/1980, 36 y.o.   MRN: [id number]  HPI Here for 2 weeks of sinus pressure, PND, and a dry cough. No fever. She is [redacted] weeks pregnant.    Review of Systems  Constitutional: Negative.   HENT: Positive for congestion, postnasal drip, sinus pain and sinus pressure. Negative for sore throat.   Eyes: Negative.   Respiratory: Positive for cough.        Objective:   Physical Exam  Constitutional: She appears well-developed and well-nourished.  HENT:  Right Ear: External ear normal.  Left Ear: External ear normal.  Nose: Nose normal.  Mouth/Throat: Oropharynx is clear and moist.  Eyes: Conjunctivae are normal.  Neck: No thyromegaly present.  Pulmonary/Chest: Effort normal and breath sounds normal.  Lymphadenopathy:    She has no cervical adenopathy.          Assessment & Plan:  Sinusitis, treat with Ceftin.  Gershon Crane, MD

## 2016-08-19 NOTE — Patient Instructions (Signed)
WE NOW OFFER   [facility] FAST TRACK!!!  SAME DAY Appointments for ACUTE CARE  Such as: Sprains, Injuries, cuts, abrasions, rashes, muscle pain, joint pain, back pain Colds, flu, sore throats, headache, allergies, cough, fever  Ear pain, sinus and eye infections Abdominal pain, nausea, vomiting, diarrhea, upset stomach Animal/insect bites  3 Easy Ways to Schedule: Walk-In Scheduling Call in scheduling Mychart Sign-up: [email or web address]         

## 2016-08-19 NOTE — Progress Notes (Signed)
Pre visit review using our clinic review tool, if applicable. No additional management support is needed unless otherwise documented below in the visit note. 

## 2016-08-21 LAB — OB RESULTS CONSOLE GBS: STREP GROUP B AG: NEGATIVE

## 2016-08-30 ENCOUNTER — Other Ambulatory Visit: Payer: Self-pay | Admitting: Family Medicine

## 2016-09-02 ENCOUNTER — Encounter (HOSPITAL_COMMUNITY): Payer: Self-pay | Admitting: *Deleted

## 2016-09-02 ENCOUNTER — Telehealth (HOSPITAL_COMMUNITY): Payer: Self-pay | Admitting: *Deleted

## 2016-09-02 ENCOUNTER — Encounter: Payer: Self-pay | Admitting: Family Medicine

## 2016-09-02 NOTE — Telephone Encounter (Signed)
Preadmission screen  

## 2016-09-03 MED ORDER — ALPRAZOLAM 0.25 MG PO TABS: 0.2500 mg | ORAL_TABLET | Freq: Three times a day (TID) | ORAL | 5 refills | Status: DC | PRN

## 2016-09-03 NOTE — Telephone Encounter (Signed)
I assume she is talking about the Xanax. I am happy to prescribe this. I gave her a month supply on 08-19-16 so I don't understand why she says she is "almost out". I can refill this at the end of the month

## 2016-09-03 NOTE — Telephone Encounter (Signed)
She is correct, I did not prescribe any that day. So please go ahead and call in #[phone] with 5 rf

## 2016-09-03 NOTE — Telephone Encounter (Addendum)
Pt states she did not get the ALPRAZolam Prudy Feeler) 0.25 MG tablet  from Dr Clent Ridges.  Pt states she was seen on 4/02 and discussed with Dr Clent Ridges at that time she may need him to prescribe this because her OBGYN was doing this at first.. Pt states her OBGYN would prefer Dr Clent Ridges take this med over..  Pt states she last got this filled on 07/25/15  [id number] - [place], [place] - [address] AT [address] OF [facility] [address]

## 2016-09-07 DIAGNOSIS — L309 Dermatitis, unspecified: Secondary | ICD-10-CM | POA: Insufficient documentation

## 2016-09-07 DIAGNOSIS — O09521 Supervision of elderly multigravida, first trimester: Secondary | ICD-10-CM | POA: Insufficient documentation

## 2016-09-07 DIAGNOSIS — E669 Obesity, unspecified: Secondary | ICD-10-CM | POA: Insufficient documentation

## 2016-09-07 DIAGNOSIS — F17200 Nicotine dependence, unspecified, uncomplicated: Secondary | ICD-10-CM | POA: Insufficient documentation

## 2016-09-07 DIAGNOSIS — Z843 Family history of consanguinity: Secondary | ICD-10-CM | POA: Insufficient documentation

## 2016-09-07 NOTE — H&P (Signed)
Diana Nelson is a 36 y.o. female, [id number] @ 39.0 wks by sure LMP c/w u/s at 9.0 wks (EDD 09/16/16), presents for IOL 2/2 cHTN on Aldomet.  Endorses FM and mild ctxs. Denies VB, LOF, headache, visual changes, epigastric pain or difficulty breathing. Cervix was 1/50/-3 on 09/05/16.  Pregnancy followed at [facility] since 4.0weeks and complicated by:  1) cHTN      - on Aldomet 250 mg po daily      - baseline labs wnl      - last [place] @ 38.3 wks on 09/05/16 - cephalic/anterior/EFW: 7lbs 11 oz (+/- 18 oz); 3499 g (+/- 511g), (66%), normal AFI. Normal weekly BPPs in 3rd trimester  2) Agoraphobia      - Xanax 0.25 mg daily  3) Advanced maternal age- Normal first trimester scan, declined AFP; normal anatomy scan  4) Smoker  5) Obesity     - TWG: 20 lbs     - BMI 36.5  6) Consanguinity     - married to 1st cousin     - normal first trimester screen     - declined AFP  7) Asthma - not using Inhaler  8) Allergy to Lamictal - rash  9) Eczema  10) GERD  11) Chronic back pain - spinal fusion in 2006  12) Migraines       OB History    Gravida Para Term Preterm AB Living   SAB TAB Ectopic Multiple Live Births                 SVB 07/17/1998 @ 39 wks, female infant, birthwt 6+9 SVB 05/10/2010 @ 40 wks, female infant, birthwt 7+4 - [facility] SAB x 1  Past Medical History:  Diagnosis Date  . Anxiety    on xanax  . Asthma    uses once or twice a day   . Hypertension   . Migraines    Past Surgical History:  Procedure Laterality Date  . knee surgeries     x3  . SPINAL FUSION     2006   . TONSILLECTOMY     Family History: family history includes Birth defects in her daughter. HTN in her parents, DM and CVA in her mother, Leukemia and bipolar disorder in her brother. Social History: +Smoker, no EtOH, no Illicit Drugs  Ethnicity: Caucasian Education: 2 years of college Marital status: Married, spouse Barbara Cower Sex of Baby: Female Name of Baby: Arlys John [facility])       Maternal Diabetes: No Genetic Screening: Normal first trimester screen, declined AFP Maternal Ultrasounds/Referrals: Normal Fetal Ultrasounds or other Referrals:  None Maternal Substance Abuse:  No Significant Maternal Medications:  Meds include: Zantac Other: Aldomet, Xanax, Claritin, Colace, Flonase, Hydrocortisone-pramoxine, Miralax, Nystatin-Triamcinolone prn Significant Maternal Lab Results:  Lab values include: Group B Strep negative, Other: Hbg 11.4 at 38.1 wks Other Comments:  Flu 03/12/16; Tdap 06/26/16  ROS 10 Systems reviewed and are negative for acute change except as noted in the HPI.  History  Exam  Dilation: 1 Effacement (%): 60 Station: -2 Exam by:: Cletis Media, RN @ [phone] EFW: 7 1/2 lbs Cephalic by Thayer Ohm maneuvers and VE Pelvis: Proven to 7+4 Bishop score: 4  Temperature 98.2 F (36.8 C), temperature source Oral, resp. rate 18, height  (1.575 m), weight 98 kg (216 lb).  Physical Exam  Nursing noteand vitalsreviewed. Neurological: She has normal reflexes.  Constitutional: She is oriented to person,  place, and time. She appears well-developed and well-nourished.  HENT:  Head: Normocephalic and atraumatic.  Neck: Normal range of motion.  Cardiovascular: Normal rate, regular rhythm and normal heart sounds.  Respiratory: Effort normal and breath sounds normal.  GI: Soft. Bowel sounds are normal. Abdomen is gravid.  Skin: Warm and dry.  Musculoskeletal: Exhibits 2+ lower extremity and pedal edema. Psychiatric: She has a normal mood and affect. Her behavior is normal.   Prenatal labs: ABO, Rh: --/--/O POS (04/23 0102) Antibody: NEG (04/23 0102) Rubella: Immune (08/21 0000) RPR: Nonreactive (08/21 0000)  HBsAg: Negative (08/21 0000)  HIV: Non-reactive (08/21 0000)  GBS: Negative (04/04 0000)  Pap smear:Normal 05/10/15  Results for orders placed or performed during the hospital encounter of 09/09/16 (from the past 24 hour(s))  Comprehensive  metabolic panel     Status: Abnormal   Collection Time: 09/09/16  1:02 AM  Result Value Ref Range   Sodium 133 (L) 135 - 145 mmol/L   Potassium 4.0 3.5 - 5.1 mmol/L   Chloride 104 101 - 111 mmol/L   CO2 20 (L) 22 - 32 mmol/L   Glucose, Bld 97 65 - 99 mg/dL   BUN 12 6 - 20 mg/dL   Creatinine, Ser 2.95 0.44 - 1.00 mg/dL   Calcium 9.1 8.9 - 28.4 mg/dL   Total Protein 6.1 (L) 6.5 - 8.1 g/dL   Albumin 2.8 (L) 3.5 - 5.0 g/dL   AST 15 15 - 41 U/L   ALT 13 (L) 14 - 54 U/L   Alkaline Phosphatase 116 38 - 126 U/L   Total Bilirubin 0.1 (L) 0.3 - 1.2 mg/dL   GFR calc non Af Amer >60 >60 mL/min   GFR calc Af Amer >60 >60 mL/min   Anion gap 9 5 - 15  Lactate dehydrogenase     Status: None   Collection Time: 09/09/16  1:02 AM  Result Value Ref Range   LDH 120 98 - 192 U/L  Uric acid     Status: None   Collection Time: 09/09/16  1:02 AM  Result Value Ref Range   Uric Acid, Serum 3.7 2.3 - 6.6 mg/dL  CBC     Status: Abnormal   Collection Time: 09/09/16  1:02 AM  Result Value Ref Range   WBC 21.5 (H) 4.0 - 10.5 K/uL   RBC 3.71 (L) 3.87 - 5.11 MIL/uL   Hemoglobin 10.7 (L) 12.0 - 15.0 g/dL   HCT 13.2 (L) 44.0 - 10.2 %   MCV 88.1 78.0 - 100.0 fL   MCH 28.8 26.0 - 34.0 pg   MCHC 32.7 30.0 - 36.0 g/dL   RDW 72.5 36.6 - 44.0 %   Platelets 299 150 - 400 K/uL  Type and screen [facility] [place]     Status: None   Collection Time: 09/09/16  1:02 AM  Result Value Ref Range   ABO/RH(D) O POS    Antibody Screen NEG    Sample Expiration 09/12/2016   Protein / creatinine ratio, urine     Status: None   Collection Time: 09/09/16  2:00 AM  Result Value Ref Range   Creatinine, Urine 34.00 mg/dL   Total Protein, Urine <6 mg/dL   Protein Creatinine Ratio        0.00 - 0.15 mg/mg[Cre]  Rapid urine drug screen (hospital performed)     Status: Abnormal   Collection Time: 09/09/16  2:00 AM  Result Value Ref Range   Opiates NONE DETECTED NONE DETECTED  Cocaine NONE DETECTED NONE DETECTED    Benzodiazepines POSITIVE (A) NONE DETECTED   Amphetamines NONE DETECTED NONE DETECTED   Tetrahydrocannabinol NONE DETECTED NONE DETECTED   Barbiturates NONE DETECTED NONE DETECTED    Assessment: IUP at 39.0 wks IOL due to cHTN Latent phase labor FWB: Overall reassuring GBS neg Obesity (BMI 36.5) Agoraphobia AMA Consanguinity Asthmatic Smoker  Plan: Admit to [facility]. Routine [facility] orders. Pre E labs w/ admission labs. IV antihypertensive med(s) for severe range pressures. Pain med/epidural prn. Expect SVD. Reviewed plan of care with patient, including rationale and processes of induction, to include Cytotec, foley bulb, pitocin and AROM. Risks and benefits of induction were reviewed, including failure of method, prolonged labor, need for further intervention, and risk of cesarean.I furthered reviewed hypertension as a cause of uteroplacental insufficiency, w/ increased risk of IUGR, oligohydramnios, and stillbirth. PT IS REFUSING FOLEY BULB AND PITOCIN BUT AGREEABLE TO CYTOTEC.     Sherre Scarlet 09/09/2016, 3:59 AM

## 2016-09-08 MED ORDER — ALPRAZOLAM 0.5 MG PO TABS
0.2500 mg | ORAL_TABLET | Freq: Every evening | ORAL | Status: DC | PRN
  Administered 2016-09-09: 0.25 mg via ORAL
  Filled 2016-09-08 (×2): qty 1

## 2016-09-08 MED ORDER — LABETALOL HCL 5 MG/ML IV SOLN
20.0000 mg | INTRAVENOUS | Status: DC | PRN
  Filled 2016-09-08: qty 16

## 2016-09-08 MED ORDER — HYDRALAZINE HCL 20 MG/ML IJ SOLN
10.0000 mg | Freq: Once | INTRAMUSCULAR | Status: DC | PRN
  Filled 2016-09-08: qty 0.5

## 2016-09-09 ENCOUNTER — Encounter (HOSPITAL_COMMUNITY): Payer: Self-pay

## 2016-09-09 ENCOUNTER — Inpatient Hospital Stay (HOSPITAL_COMMUNITY)
Admission: RE | Admit: 2016-09-09 | Discharge: 2016-09-11 | DRG: 774 | Disposition: A | Discharge status: Home / Self Care | Payer: BLUE CROSS/BLUE SHIELD | Source: Ambulatory Visit | Attending: Obstetrics & Gynecology | Admitting: Obstetrics & Gynecology

## 2016-09-09 DIAGNOSIS — O9962 Diseases of the digestive system complicating childbirth: Secondary | ICD-10-CM | POA: Diagnosis present

## 2016-09-09 DIAGNOSIS — Z3A39 39 weeks gestation of pregnancy: Secondary | ICD-10-CM

## 2016-09-09 DIAGNOSIS — F419 Anxiety disorder, unspecified: Secondary | ICD-10-CM | POA: Diagnosis present

## 2016-09-09 DIAGNOSIS — Z6839 Body mass index (BMI) 39.0-39.9, adult: Secondary | ICD-10-CM

## 2016-09-09 DIAGNOSIS — O99334 Smoking (tobacco) complicating childbirth: Secondary | ICD-10-CM | POA: Diagnosis present

## 2016-09-09 DIAGNOSIS — M549 Dorsalgia, unspecified: Secondary | ICD-10-CM | POA: Diagnosis present

## 2016-09-09 DIAGNOSIS — F4000 Agoraphobia, unspecified: Secondary | ICD-10-CM | POA: Diagnosis present

## 2016-09-09 DIAGNOSIS — O1002 Pre-existing essential hypertension complicating childbirth: Secondary | ICD-10-CM | POA: Diagnosis present

## 2016-09-09 DIAGNOSIS — O99214 Obesity complicating childbirth: Secondary | ICD-10-CM | POA: Diagnosis present

## 2016-09-09 DIAGNOSIS — E669 Obesity, unspecified: Secondary | ICD-10-CM

## 2016-09-09 DIAGNOSIS — O09523 Supervision of elderly multigravida, third trimester: Secondary | ICD-10-CM

## 2016-09-09 DIAGNOSIS — O9952 Diseases of the respiratory system complicating childbirth: Secondary | ICD-10-CM | POA: Diagnosis present

## 2016-09-09 DIAGNOSIS — K219 Gastro-esophageal reflux disease without esophagitis: Secondary | ICD-10-CM | POA: Diagnosis present

## 2016-09-09 DIAGNOSIS — O10919 Unspecified pre-existing hypertension complicating pregnancy, unspecified trimester: Secondary | ICD-10-CM | POA: Diagnosis present

## 2016-09-09 DIAGNOSIS — L309 Dermatitis, unspecified: Secondary | ICD-10-CM

## 2016-09-09 DIAGNOSIS — O99344 Other mental disorders complicating childbirth: Secondary | ICD-10-CM | POA: Diagnosis present

## 2016-09-09 DIAGNOSIS — F17200 Nicotine dependence, unspecified, uncomplicated: Secondary | ICD-10-CM | POA: Diagnosis present

## 2016-09-09 DIAGNOSIS — Z843 Family history of consanguinity: Secondary | ICD-10-CM | POA: Diagnosis not present

## 2016-09-09 DIAGNOSIS — G8929 Other chronic pain: Secondary | ICD-10-CM | POA: Diagnosis present

## 2016-09-09 DIAGNOSIS — J45909 Unspecified asthma, uncomplicated: Secondary | ICD-10-CM | POA: Diagnosis present

## 2016-09-09 LAB — CBC
HEMATOCRIT: 32.7 % — AB (ref 36.0–46.0)
HEMOGLOBIN: 10.7 g/dL — AB (ref 12.0–15.0)
MCH: 28.8 pg (ref 26.0–34.0)
MCHC: 32.7 g/dL (ref 30.0–36.0)
MCV: 88.1 fL (ref 78.0–100.0)
Platelets: 299 10*3/uL (ref 150–400)
RBC: 3.71 MIL/uL — ABNORMAL LOW (ref 3.87–5.11)
RDW: 14.5 % (ref 11.5–15.5)
WBC: 21.5 10*3/uL — AB (ref 4.0–10.5)

## 2016-09-09 LAB — COMPREHENSIVE METABOLIC PANEL
ALBUMIN: 2.8 g/dL — AB (ref 3.5–5.0)
ALT: 13 U/L — AB (ref 14–54)
AST: 15 U/L (ref 15–41)
Alkaline Phosphatase: 116 U/L (ref 38–126)
Anion gap: 9 (ref 5–15)
BUN: 12 mg/dL (ref 6–20)
CALCIUM: 9.1 mg/dL (ref 8.9–10.3)
CO2: 20 mmol/L — AB (ref 22–32)
Chloride: 104 mmol/L (ref 101–111)
Creatinine, Ser: 0.47 mg/dL (ref 0.44–1.00)
GFR calc Af Amer: >60 mL/min (ref >60)
GLUCOSE: 97 mg/dL (ref 65–99)
POTASSIUM: 4 mmol/L (ref 3.5–5.1)
SODIUM: 133 mmol/L — AB (ref 135–145)
TOTAL PROTEIN: 6.1 g/dL — AB (ref 6.5–8.1)
Total Bilirubin: 0.1 mg/dL — ABNORMAL LOW (ref 0.3–1.2)

## 2016-09-09 LAB — RPR: RPR: NONREACTIVE

## 2016-09-09 LAB — LACTATE DEHYDROGENASE: LDH: 120 U/L (ref 98–192)

## 2016-09-09 LAB — TYPE AND SCREEN
ABO/RH(D): O POS
ANTIBODY SCREEN: NEGATIVE

## 2016-09-09 LAB — RAPID URINE DRUG SCREEN, HOSP PERFORMED
Amphetamines: NOT DETECTED
BARBITURATES: NOT DETECTED
BENZODIAZEPINES: POSITIVE — AB
COCAINE: NOT DETECTED
OPIATES: NOT DETECTED
TETRAHYDROCANNABINOL: NOT DETECTED

## 2016-09-09 LAB — URIC ACID: Uric Acid, Serum: 3.7 mg/dL (ref 2.3–6.6)

## 2016-09-09 LAB — PROTEIN / CREATININE RATIO, URINE
Creatinine, Urine: 34 mg/dL
Total Protein, Urine: <6 mg/dL

## 2016-09-09 MED ORDER — OXYTOCIN BOLUS FROM INFUSION: 500.0000 mL | Freq: Once | INTRAVENOUS | Status: DC

## 2016-09-09 MED ORDER — ALPRAZOLAM 0.5 MG PO TABS: 0.2500 mg | ORAL_TABLET | Freq: Every day | ORAL | Status: DC | PRN

## 2016-09-09 MED ORDER — ONDANSETRON HCL 4 MG/2ML IJ SOLN: 4.0000 mg | Freq: Four times a day (QID) | INTRAMUSCULAR | Status: DC | PRN

## 2016-09-09 MED ORDER — LACTATED RINGERS IV SOLN
500.0000 mL | INTRAVENOUS | Status: DC | PRN
  Administered 2016-09-09: 500 mL via INTRAVENOUS

## 2016-09-09 MED ORDER — TERBUTALINE SULFATE 1 MG/ML IJ SOLN
0.2500 mg | Freq: Once | INTRAMUSCULAR | Status: DC | PRN
  Filled 2016-09-09: qty 1

## 2016-09-09 MED ORDER — MISOPROSTOL 25 MCG QUARTER TABLET
25.0000 ug | ORAL_TABLET | ORAL | Status: DC | PRN
  Administered 2016-09-09 (×2): 25 ug via VAGINAL
  Filled 2016-09-09 (×3): qty 1

## 2016-09-09 MED ORDER — FLEET ENEMA 7-19 GM/118ML RE ENEM: 1.0000 | ENEMA | RECTAL | Status: DC | PRN

## 2016-09-09 MED ORDER — OXYTOCIN 40 UNITS IN LACTATED RINGERS INFUSION - SIMPLE MED
2.5000 [IU]/h | INTRAVENOUS | Status: DC
  Administered 2016-09-10: 2.5 [IU]/h via INTRAVENOUS

## 2016-09-09 MED ORDER — NYSTATIN 100000 UNIT/GM EX CREA
TOPICAL_CREAM | Freq: Two times a day (BID) | CUTANEOUS | Status: DC
  Filled 2016-09-09: qty 15

## 2016-09-09 MED ORDER — OXYTOCIN 40 UNITS IN LACTATED RINGERS INFUSION - SIMPLE MED
1.0000 m[IU]/min | INTRAVENOUS | Status: DC
  Administered 2016-09-09: 3 m[IU]/min via INTRAVENOUS
  Administered 2016-09-09: 1 m[IU]/min via INTRAVENOUS

## 2016-09-09 MED ORDER — ALPRAZOLAM 0.5 MG PO TABS
0.2500 mg | ORAL_TABLET | Freq: Three times a day (TID) | ORAL | Status: DC | PRN
  Administered 2016-09-09: 0.25 mg via ORAL
  Filled 2016-09-09: qty 1

## 2016-09-09 MED ORDER — SOD CITRATE-CITRIC ACID 500-334 MG/5ML PO SOLN: 30.0000 mL | ORAL | Status: DC | PRN

## 2016-09-09 MED ORDER — LACTATED RINGERS IV SOLN
INTRAVENOUS | Status: DC
  Administered 2016-09-09 (×2): via INTRAVENOUS

## 2016-09-09 MED ORDER — LIDOCAINE HCL (PF) 1 % IJ SOLN
30.0000 mL | INTRAMUSCULAR | Status: DC | PRN
  Administered 2016-09-10: 30 mL via SUBCUTANEOUS
  Filled 2016-09-09: qty 30

## 2016-09-09 MED ORDER — OXYTOCIN 40 UNITS IN LACTATED RINGERS INFUSION - SIMPLE MED
1.0000 m[IU]/min | INTRAVENOUS | Status: DC
  Filled 2016-09-09: qty 1000

## 2016-09-09 MED ORDER — ACETAMINOPHEN 325 MG PO TABS: 650.0000 mg | ORAL_TABLET | ORAL | Status: DC | PRN

## 2016-09-09 NOTE — Progress Notes (Signed)
Subjective: Pt feeling contractions; Discussed starting PItocin if no cervical change. Pt is agreeable to plan  Objective: BP (!) 111/58   Pulse 87   Temp 98.6 F (37 C) (Oral)   Resp 18   Ht  (1.575 m)   Wt 216 lb (98 kg)   BMI 39.51 kg/m  No intake/output data recorded. No intake/output data recorded.  FHT: Category UC:   regular, every 3-4 minutes SVE:   Dilation: 2 Effacement (%): Thick Station: -2 Exam by:: Illene Bolus CNM  Results for orders placed or performed during the hospital encounter of 09/09/16 (from the past 24 hour(s))  Comprehensive metabolic panel     Status: Abnormal   Collection Time: 09/09/16  1:02 AM  Result Value Ref Range   Sodium 133 (L) 135 - 145 mmol/L   Potassium 4.0 3.5 - 5.1 mmol/L   Chloride 104 101 - 111 mmol/L   CO2 20 (L) 22 - 32 mmol/L   Glucose, Bld 97 65 - 99 mg/dL   BUN 12 6 - 20 mg/dL   Creatinine, Ser 4.09 0.44 - 1.00 mg/dL   Calcium 9.1 8.9 - 81.1 mg/dL   Total Protein 6.1 (L) 6.5 - 8.1 g/dL   Albumin 2.8 (L) 3.5 - 5.0 g/dL   AST 15 15 - 41 U/L   ALT 13 (L) 14 - 54 U/L   Alkaline Phosphatase 116 38 - 126 U/L   Total Bilirubin 0.1 (L) 0.3 - 1.2 mg/dL   GFR calc non Af Amer >60 >60 mL/min   GFR calc Af Amer >60 >60 mL/min   Anion gap 9 5 - 15  Lactate dehydrogenase     Status: None   Collection Time: 09/09/16  1:02 AM  Result Value Ref Range   LDH 120 98 - 192 U/L  Uric acid     Status: None   Collection Time: 09/09/16  1:02 AM  Result Value Ref Range   Uric Acid, Serum 3.7 2.3 - 6.6 mg/dL  CBC     Status: Abnormal   Collection Time: 09/09/16  1:02 AM  Result Value Ref Range   WBC 21.5 (H) 4.0 - 10.5 K/uL   RBC 3.71 (L) 3.87 - 5.11 MIL/uL   Hemoglobin 10.7 (L) 12.0 - 15.0 g/dL   HCT 91.4 (L) 78.2 - 95.6 %   MCV 88.1 78.0 - 100.0 fL   MCH 28.8 26.0 - 34.0 pg   MCHC 32.7 30.0 - 36.0 g/dL   RDW 21.3 08.6 - 57.8 %   Platelets 299 150 - 400 K/uL  Type and screen [facility] [place]     Status: None   Collection Time: 09/09/16  1:02 AM  Result Value Ref Range   ABO/RH(D) O POS    Antibody Screen NEG    Sample Expiration 09/12/2016   RPR     Status: None   Collection Time: 09/09/16  1:02 AM  Result Value Ref Range   RPR Ser Ql Non Reactive Non Reactive  Protein / creatinine ratio, urine     Status: None   Collection Time: 09/09/16  2:00 AM  Result Value Ref Range   Creatinine, Urine 34.00 mg/dL   Total Protein, Urine <6 mg/dL   Protein Creatinine Ratio        0.00 - 0.15 mg/mg[Cre]  Rapid urine drug screen (hospital performed)     Status: Abnormal   Collection Time: 09/09/16  2:00 AM  Result Value Ref Range   Opiates NONE DETECTED  NONE DETECTED   Cocaine NONE DETECTED NONE DETECTED   Benzodiazepines POSITIVE (A) NONE DETECTED   Amphetamines NONE DETECTED NONE DETECTED   Tetrahydrocannabinol NONE DETECTED NONE DETECTED   Barbiturates NONE DETECTED NONE DETECTED    Assessment:  Induction for Chronic HTN- Normotensive  Plan: Start Pitocin Pain management as desired ( pt does not want epidural) Anticipate vaginal delivery  Elmore Guise Calle Schader CNM, [place] 09/09/2016, 4:38 PM

## 2016-09-09 NOTE — Anesthesia Pain Management Evaluation Note (Signed)
  CRNA Pain Management Visit Note  Patient: Diana Nelson, 36 y.o., female  "Hello I am a member of the anesthesia team at [facility]. We have an anesthesia team available at all times to provide care throughout the hospital, including epidural management and anesthesia for C-section. I don't know your plan for the delivery whether it a natural birth, water birth, IV sedation, nitrous supplementation, doula or epidural, but we want to meet your pain goals."   1.Was your pain managed to your expectations on prior hospitalizations?   Yes   2.What is your expectation for pain management during this hospitalization?     Labor support without medications and IV pain meds  3.How can we help you reach that goal? Provide support;educated about epidural option  Record the patient's initial score and the patient's pain goal.   Pain: 2  Pain Goal: 8 [facility] wants you to be able to say your pain was always managed very well.  Edison Pace 09/09/2016

## 2016-09-09 NOTE — Progress Notes (Signed)
Subjective: Pt does not want Pitocin started. Offered for her to get up and walk for 2 hours with intermittent monitoring and then if no cervical change : start pitocin  Objective: BP 125/69   Pulse 87   Temp 97.8 F (36.6 C) (Oral)   Resp 17   Ht  (1.575 m)   Wt 216 lb (98 kg)   BMI 39.51 kg/m  No intake/output data recorded. No intake/output data recorded.  FHT: Category 1 UC:   q3 SVE:   Dilation: 1 Effacement (%): Thick Station: -2 Exam by:: Illene Bolus RN  1/2/th/-2  Assessment:  Early Labor Pt up to walk  Plan: Reevaluate at 200pm   [employer]  CNM, [place] 09/09/2016, 12:18 PM

## 2016-09-09 NOTE — Progress Notes (Signed)
Diana Nelson is a 36 y.o. G3P2000 at 14w0dadmitted for induction of labor due to Chronic hypertension.  Subjective:  Comfortable with  Labor support without medications Contractions every 1-2 minutes, lasting 30 seconds, intensity 3/10. With placing foley bulb ;SROM occurred large amount clear fluid. Pt does not want PITOCIN; going natural. She wants to see if she can labor on her own since her water is broken    Objective: BP 117/71   Pulse 95   Temp 97.5 F (36.4 C) (Oral)   Resp 18   Ht  (1.575 m)   Wt 216 lb (98 kg)   BMI 39.51 kg/m  No intake/output data recorded. No intake/output data recorded.  FHT:  FHR: Category 1   SVE:  1-2/th/-2 SROM -large  Amount clear fluid Labs: Lab Results  Component Value Date   WBC 21.5 (H) 09/09/2016   HGB 10.7 (L) 09/09/2016   HCT 32.7 (L) 09/09/2016   MCV 88.1 09/09/2016   PLT 299 09/09/2016    Assessment / Plan: Induction of labor due to term with favorable cervix,  progressing well on pitocin Fetal Wellbeing: reassuring Anticipated MOD:  NSVD  Lori A Clemmons CNM 09/09/2016, 8:37 AM

## 2016-09-09 NOTE — Progress Notes (Signed)
Subjective: Pt states contractions are strong when she has them. Handling them well. Up walking in room. Changing Position.  Objective: BP 119/74   Pulse 88   Temp 98.6 F (37 C) (Oral)   Resp 18   Ht  (1.575 m)   Wt 216 lb (98 kg)   BMI 39.51 kg/m  No intake/output data recorded. No intake/output data recorded.  FHT: Category UC:   regular, every 3 minutes SVE:   Dilation: 3 Effacement (%): 50 Station: -2 Exam by:: Lawson Fiscal Nester Bachus CNM Pitocin at 4   Assessment:   Early Labor  Plan:  Continue Pitocin titration Anticipate Vaginal Delivery  Diana Nelson CNM, MN 09/09/2016, 4:59 PM

## 2016-09-10 ENCOUNTER — Encounter (HOSPITAL_COMMUNITY): Payer: Self-pay

## 2016-09-10 MED ORDER — DIBUCAINE 1 % RE OINT: 1.0000 "application " | TOPICAL_OINTMENT | RECTAL | Status: DC | PRN

## 2016-09-10 MED ORDER — ZOLPIDEM TARTRATE 5 MG PO TABS: 5.0000 mg | ORAL_TABLET | Freq: Every evening | ORAL | Status: DC | PRN

## 2016-09-10 MED ORDER — ERYTHROMYCIN 5 MG/GM OP OINT
TOPICAL_OINTMENT | OPHTHALMIC | Status: AC
  Filled 2016-09-10: qty 1

## 2016-09-10 MED ORDER — PRENATAL MULTIVITAMIN CH
1.0000 | ORAL_TABLET | Freq: Every day | ORAL | Status: DC
  Administered 2016-09-10 – 2016-09-11 (×2): 1 via ORAL
  Filled 2016-09-10 (×2): qty 1

## 2016-09-10 MED ORDER — WITCH HAZEL-GLYCERIN EX PADS
1.0000 "application " | MEDICATED_PAD | CUTANEOUS | Status: DC | PRN
  Administered 2016-09-10: 1 via TOPICAL

## 2016-09-10 MED ORDER — METHYLDOPA 250 MG PO TABS
250.0000 mg | ORAL_TABLET | Freq: Two times a day (BID) | ORAL | Status: DC
  Administered 2016-09-10 – 2016-09-11 (×3): 250 mg via ORAL
  Filled 2016-09-10 (×6): qty 1

## 2016-09-10 MED ORDER — CYCLOBENZAPRINE HCL 5 MG PO TABS
5.0000 mg | ORAL_TABLET | Freq: Three times a day (TID) | ORAL | Status: DC | PRN
  Filled 2016-09-10: qty 1

## 2016-09-10 MED ORDER — OXYTOCIN 40 UNITS IN LACTATED RINGERS INFUSION - SIMPLE MED: 1.0000 m[IU]/min | INTRAVENOUS | Status: DC

## 2016-09-10 MED ORDER — COCONUT OIL OIL: 1.0000 "application " | TOPICAL_OIL | Status: DC | PRN

## 2016-09-10 MED ORDER — DIPHENHYDRAMINE HCL 25 MG PO CAPS: 25.0000 mg | ORAL_CAPSULE | Freq: Four times a day (QID) | ORAL | Status: DC | PRN

## 2016-09-10 MED ORDER — ONDANSETRON HCL 4 MG/2ML IJ SOLN: 4.0000 mg | INTRAMUSCULAR | Status: DC | PRN

## 2016-09-10 MED ORDER — SIMETHICONE 80 MG PO CHEW: 80.0000 mg | CHEWABLE_TABLET | ORAL | Status: DC | PRN

## 2016-09-10 MED ORDER — SENNOSIDES-DOCUSATE SODIUM 8.6-50 MG PO TABS
2.0000 | ORAL_TABLET | ORAL | Status: DC
  Administered 2016-09-10: 2 via ORAL
  Filled 2016-09-10: qty 2

## 2016-09-10 MED ORDER — LORATADINE 10 MG PO TABS: 10.0000 mg | ORAL_TABLET | Freq: Every evening | ORAL | Status: DC | PRN

## 2016-09-10 MED ORDER — ONDANSETRON HCL 4 MG PO TABS
4.0000 mg | ORAL_TABLET | ORAL | Status: DC | PRN
  Administered 2016-09-11: 4 mg via ORAL
  Filled 2016-09-10: qty 1

## 2016-09-10 MED ORDER — FENTANYL CITRATE (PF) 100 MCG/2ML IJ SOLN
100.0000 ug | INTRAMUSCULAR | Status: DC | PRN
  Administered 2016-09-10: 50 ug via INTRAVENOUS
  Filled 2016-09-10: qty 2

## 2016-09-10 MED ORDER — PROMETHAZINE HCL 25 MG/ML IJ SOLN
25.0000 mg | Freq: Four times a day (QID) | INTRAMUSCULAR | Status: DC | PRN
  Administered 2016-09-10: 25 mg via INTRAVENOUS
  Filled 2016-09-10: qty 1

## 2016-09-10 MED ORDER — ALPRAZOLAM 0.5 MG PO TABS
0.2500 mg | ORAL_TABLET | Freq: Three times a day (TID) | ORAL | Status: DC | PRN
  Administered 2016-09-10: 0.25 mg via ORAL
  Filled 2016-09-10: qty 1

## 2016-09-10 MED ORDER — TETANUS-DIPHTH-ACELL PERTUSSIS 5-2.5-18.5 LF-MCG/0.5 IM SUSP: 0.5000 mL | Freq: Once | INTRAMUSCULAR | Status: DC

## 2016-09-10 MED ORDER — BENZOCAINE-MENTHOL 20-0.5 % EX AERO
1.0000 "application " | INHALATION_SPRAY | CUTANEOUS | Status: DC | PRN
  Administered 2016-09-10: 1 via TOPICAL
  Filled 2016-09-10: qty 56

## 2016-09-10 MED ORDER — IBUPROFEN 600 MG PO TABS
600.0000 mg | ORAL_TABLET | Freq: Four times a day (QID) | ORAL | Status: DC
  Administered 2016-09-10 – 2016-09-11 (×6): 600 mg via ORAL
  Filled 2016-09-10 (×6): qty 1

## 2016-09-10 MED ORDER — FLUTICASONE PROPIONATE 50 MCG/ACT NA SUSP
2.0000 | Freq: Every day | NASAL | Status: DC
  Administered 2016-09-10 – 2016-09-11 (×2): 2 via NASAL
  Filled 2016-09-10 (×2): qty 16

## 2016-09-10 MED ORDER — ALBUTEROL SULFATE (2.5 MG/3ML) 0.083% IN NEBU: 3.0000 mL | INHALATION_SOLUTION | RESPIRATORY_TRACT | Status: DC | PRN

## 2016-09-10 MED ORDER — OXYCODONE-ACETAMINOPHEN 5-325 MG PO TABS
1.0000 | ORAL_TABLET | Freq: Four times a day (QID) | ORAL | Status: DC | PRN
  Administered 2016-09-10 – 2016-09-11 (×4): 1 via ORAL
  Filled 2016-09-10 (×4): qty 1

## 2016-09-10 MED ORDER — ACETAMINOPHEN 325 MG PO TABS
650.0000 mg | ORAL_TABLET | ORAL | Status: DC | PRN
  Administered 2016-09-10: 650 mg via ORAL
  Filled 2016-09-10: qty 2

## 2016-09-10 NOTE — Lactation Note (Signed)
This note was copied from a baby's chart. Lactation Consultation Note  Patient Name: Diana Nelson [id number] Date: 09/10/2016 Reason for consult: Initial assessment   Initial visit attempted at 11 hours of life, but Mom has multiple visitors in room. Lactation brochure left at bedside; LC to f/u later.   Maternal H&P notes that mom is taking Xanax 0.25mg  qd (L3) and Aldomet  bid (L2).  FOB is mother's first cousin.  Lurline Hare [facility] 09/10/2016, 1:30 PM

## 2016-09-10 NOTE — Progress Notes (Signed)
Subjective: Breathing with contractions.  Pt declines needing something for pain.  FOB sleeping in room  Objective: BP 123/80 (BP Location: Left Arm)   Pulse 92   Temp 97.8 F (36.6 C) (Axillary)   Resp 18   Ht  (1.575 m)   Wt 98 kg (216 lb)   BMI 39.51 kg/m  No intake/output data recorded. No intake/output data recorded.  FHT: Category 1 UC:   irregular, every 3-5 minutes SVE:   4-5/50/-1 NP CNM Pitocin at 7 MVUs 155  Assessment:  Pt is a 36 yo G3P2002 at 39.1 IUP CHTN induction of labor Cat 1 strip  Plan: Increase pitocin Anticipate SVD  Kenney Houseman CNM, MSN 09/10/2016, 12:04 AM

## 2016-09-10 NOTE — Progress Notes (Signed)
Patient complaining of back pain and states that she has metal implants in her back that seem to be giving her trouble since delivery. Patient has tried ibuprofen, tylenol and a K pad along with ambulating frequently and sitting in the chair instead of the bed; however, her pain does not go away for very long. Called Dr. Normand Sloop, who ordered percocet and flexeril prn--see orders. Will continue to monitor. Earl Gala, Linda Hedges [place]

## 2016-09-10 NOTE — Plan of Care (Signed)
Problem: Nutritional: Goal: Mothers verbalization of comfort with breastfeeding process will improve Outcome: Completed/Met Date Met: 09/10/16 Baby has not been interested in breast feeding yet. Attempted to assist mother latch after baby assessment; however still no interest from baby. Baby has been somewhat spitty and has only had one stool for now. Mother states she is hand expressing while baby skin to skin and is getting drops in baby's mouth and on lips. Encouraged frequent skin to skin and frequent hand expression.

## 2016-09-11 LAB — CBC
HCT: 28.3 % — ABNORMAL LOW (ref 36.0–46.0)
Hemoglobin: 9.4 g/dL — ABNORMAL LOW (ref 12.0–15.0)
MCH: 29.5 pg (ref 26.0–34.0)
MCHC: 33.2 g/dL (ref 30.0–36.0)
MCV: 88.7 fL (ref 78.0–100.0)
PLATELETS: 311 10*3/uL (ref 150–400)
RBC: 3.19 MIL/uL — ABNORMAL LOW (ref 3.87–5.11)
RDW: 14.6 % (ref 11.5–15.5)
WBC: 20.5 10*3/uL — ABNORMAL HIGH (ref 4.0–10.5)

## 2016-09-11 MED ORDER — OXYCODONE-ACETAMINOPHEN 5-325 MG PO TABS: 1.0000 | ORAL_TABLET | Freq: Four times a day (QID) | ORAL | 0 refills | Status: DC | PRN

## 2016-09-11 NOTE — Discharge Instructions (Signed)

## 2016-09-11 NOTE — Discharge Summary (Signed)
OB Discharge Summary     Patient Name: Diana Nelson DOB: Jan 21, 1981 MRN: [id number]  Date of admission: 09/09/2016 Delivering MD: Kenney Houseman   Date of Delivery:  09/10/16  Date of discharge: 09/11/2016  Admitting diagnosis: INDUCTION Intrauterine pregnancy: [redacted]w[redacted]d     Secondary diagnosis:  Principal Problem:   SVD (spontaneous vaginal delivery) Active Problems:   Chronic hypertension affecting pregnancy  Additional problems: Chronic back pain     Discharge diagnosis: Term Pregnancy Delivered and CHTN                                                                                                Post partum procedures:None  Augmentation: Pitocin and Cytotec  Complications: None  Hospital course:  Induction of Labor With Vaginal Delivery   36 y.o. yo G3P3001 at [redacted]w[redacted]d was admitted to the hospital 09/09/2016 for induction of labor.  Indication for induction: Chronic hypertension, on Aldomet.  Patient had an uncomplicated labor course as follows: Membrane Rupture Time/Date: 8:28 AM ,09/09/2016   Intrapartum Procedures: Episiotomy: None [1]                                         Lacerations:  1st degree [2];Perineal [11]  Patient had delivery of a Viable infant.  Information for the patient's newborn:  Kesley, Mullens [[id number]]  Delivery Method: Vaginal, Spontaneous Delivery (Filed from Delivery Summary)   09/10/2016  Details of delivery can be found in separate delivery note.  Patient had a routine postpartum course. Patient is discharged home 09/11/16, due to her desire for early d/c.  Peds OK with baby's d/c.  Baby to be circ'd before d/c by Dr. Sallye Ober.  She did have back pain pp, and was given Rx for Percocet and was to continue Flexeril and Ibuprophen pp.  She will continue Aldomet 250 mg po BID.  She plans to continue use of Xanax for control of her anxiety and agoraphobia.  She will be referred back to her primary MD for management of this medication.  [facility] RN will see patient within next 5 days for BP check s/p d/c.    Physical exam  Vitals:   09/10/16 0851 09/10/16 1608 09/10/16 2205 09/11/16 0633  BP: 118/67 (!) 119/57 120/68 108/71  Pulse: 90 88  86  Resp: Temp: 98.2 F (36.8 C) 98 F (36.7 C)  97.8 F (36.6 C)  TempSrc: Oral Oral  Oral  SpO2: 100%     Weight:      Height:       General: alert Lochia: appropriate Uterine Fundus: firm Incision: Healing well with no significant drainage DVT Evaluation: No evidence of DVT seen on physical exam. Negative Homan's sign. Calf/Ankle edema is present Labs: CBC Latest Ref Rng & Units 09/11/2016 09/09/2016 01/13/2015  WBC 4.0 - 10.5 K/uL 20.5(H) 21.5(H) 12.9(H)  Hemoglobin 12.0 - 15.0 g/dL 7.8(G) 10.7(L) 12.9  Hematocrit 36.0 - 46.0 % 28.3(L) 32.7(L) 38.9  Platelets 150 -  400 K/uL 311 299 278.0   CMP     Component Value Date/Time   NA 133 (L) 09/09/2016 0102   K 4.0 09/09/2016 0102   CL 104 09/09/2016 0102   CO2 20 (L) 09/09/2016 0102   GLUCOSE 97 09/09/2016 0102   BUN 12 09/09/2016 0102   CREATININE 0.47 09/09/2016 0102   CALCIUM 9.1 09/09/2016 0102   PROT 6.1 (L) 09/09/2016 0102   ALBUMIN 2.8 (L) 09/09/2016 0102   AST 15 09/09/2016 0102   ALT 13 (L) 09/09/2016 0102   ALKPHOS 116 09/09/2016 0102   BILITOT 0.1 (L) 09/09/2016 0102   GFRNONAA >60 09/09/2016 0102   GFRAA >60 09/09/2016 0102    Discharge instruction: per After Visit Summary and "Baby and Me Booklet".  After visit meds:  Allergies as of 09/11/2016      Reactions   Azithromycin    REACTION: nausea   Lamictal [lamotrigine] Rash      Medication List    TAKE these medications   albuterol 108 (90 Base) MCG/ACT inhaler Commonly known as:  PROVENTIL HFA INHALE 2 PUFFS INTO THE LUNGS EVERY 4 (FOUR) HOURS AS NEEDED FOR WHEEZING OR SHORTNESS OF BREATH.   ALPRAZolam 0.25 MG tablet Commonly known as:  XANAX Take 1 tablet (0.25 mg total) by mouth 3 (three) times daily as needed for anxiety.    CLARITIN 10 MG tablet Generic drug:  loratadine Take 10 mg by mouth at bedtime as needed for allergies.   cyclobenzaprine 10 MG tablet Commonly known as:  FLEXERIL Take 1 tablet (10 mg total) by mouth 2 (two) times daily as needed for muscle spasms.   fluticasone 50 MCG/ACT nasal spray Commonly known as:  FLONASE Place 2 sprays into both nostrils daily.   hydrocortisone-pramoxine 2.5-1 % rectal cream Commonly known as:  ANALPRAM-HC Place 1 application rectally 3 (three) times daily.   methyldopa 250 MG tablet Commonly known as:  ALDOMET TAKE 1 TABLET (250 MG TOTAL) BY MOUTH 2 (TWO) TIMES DAILY.   oxyCODONE-acetaminophen 5-325 MG tablet Commonly known as:  PERCOCET/ROXICET Take 1 tablet by mouth every 6 (six) hours as needed for moderate pain or severe pain (back pain).       Diet: home with mother  Activity: Advance as tolerated. Pelvic rest for 6 weeks.   Outpatient follow up:6 weeks Follow up Appt:[facility] RN to see within 5 days for BP check Follow up Visit:No Follow-up on file.  Postpartum contraception: Declines  Newborn Data: Live born female, Arlys John. Dorie Rank) Birth Weight: 6 lb 15.1 oz (3150 g) APGAR: 5, 8  Baby Feeding: Breast Disposition:home with mother   09/11/2016 Nigel Bridgeman, CNM  ADDENDUM: I performed circumcision on newborn Barbara Cower without complications.  Dr. Sallye Ober.

## 2016-09-11 NOTE — Progress Notes (Addendum)
Subjective: Postpartum Day 1: Vaginal delivery, 1st degree laceration Patient up ad lib, reports no syncope or dizziness. Feeding:  Breast Contraceptive plan:  Does not plan to use contraception--wants another baby soon.  Started on Percocet and Flexeril yesterday for chronic back pain--meds helping. Had questions about birth process--rapid progression to delivery, baby with facial bruising due to rapid descent, 5/8 Apgars, but doing well.  Peds following bili levels.  Patient interested in going home today, if baby able to go.  Objective: Vital signs in last 24 hours: Temp:  [97.8 F (36.6 C)-98.2 F (36.8 C)] 97.8 F (36.6 C) (04/25 1610) Pulse Rate:  [86-90] 86 (04/25 0633) Resp:  [18-20] 20 (04/25 9604) BP: (108-120)/(57-71) 108/71 (04/25 5409) SpO2:  [100 %] 100 % (04/24 0851)   Vitals:   09/10/16 0851 09/10/16 1608 09/10/16 2205 09/11/16 0633  BP: 118/67 (!) 119/57 120/68 108/71  Pulse: 90 88  86  Resp: Temp: 98.2 F (36.8 C) 98 F (36.7 C)  97.8 F (36.6 C)  TempSrc: Oral Oral  Oral  SpO2: 100%     Weight:      Height:        Physical Exam:  General: alert Lochia: appropriate Uterine Fundus: firm Perineum: healing well DVT Evaluation: No evidence of DVT seen on physical exam. Negative Homan's sign. Calf/Ankle edema is present.   CBC Latest Ref Rng & Units 09/11/2016 09/09/2016 01/13/2015  WBC 4.0 - 10.5 K/uL 20.5(H) 21.5(H) 12.9(H)  Hemoglobin 12.0 - 15.0 g/dL 8.1(X) 10.7(L) 12.9  Hematocrit 36.0 - 46.0 % 28.3(L) 32.7(L) 38.9  Platelets 150 - 400 K/uL 311 299 278.0     Assessment/Plan: Status post vaginal delivery day 1. Chronic hypertension, on [place] Chronic back pain Interested in D/C today, if baby can go. Stable Continue current care.     Diana Nelson CNM 09/11/2016, 8:22 AM  ADDENDUM:   I saw and examined patient at bedside and agree with above findings, assessment and plan as outlined above by CNM Diana Nelson.  I discussed  with patient risks, benefits and alternatives of circumcision including risks of bleeding, infection, damage to organs and need for further procedures.  All her questions were answered and she consented for the procedure to be done on her son.  Dr. Sallye Ober

## 2016-09-12 NOTE — Progress Notes (Signed)
Post discharge chart review completed per insurance co request. 

## 2016-09-13 ENCOUNTER — Other Ambulatory Visit: Payer: Self-pay | Admitting: Family Medicine

## 2016-09-13 NOTE — Telephone Encounter (Signed)
Yes refill this for 90 days. Have her see me in 90 days to recheck

## 2016-09-13 NOTE — Telephone Encounter (Signed)
Can we refill this? 

## 2016-09-17 ENCOUNTER — Telehealth: Payer: Self-pay | Admitting: Family Medicine

## 2016-09-17 ENCOUNTER — Other Ambulatory Visit: Payer: Self-pay | Admitting: Family Medicine

## 2016-09-17 MED ORDER — METHYLPHENIDATE HCL ER (LA) 10 MG PO CP24: 10.0000 mg | ORAL_CAPSULE | Freq: Every day | ORAL | 0 refills | Status: DC

## 2016-09-17 NOTE — Telephone Encounter (Signed)
Pt would like to start back on Rx Ritalin but a lower dose. Pt is aware of the 3 day refill and someone will give her a call once it is ready for pick up.   Pt need new Rx for omeprazole sodium bicarbonate  Pharm:  [facility] [address]

## 2016-09-17 NOTE — Telephone Encounter (Signed)
She took Ritalin LA 20 mg before, so this time we will try 10 mg daily. Ready to pick up

## 2016-09-18 MED ORDER — OMEPRAZOLE-SODIUM BICARBONATE 40-1100 MG PO CAPS: 1.0000 | ORAL_CAPSULE | Freq: Every day | ORAL | 3 refills | Status: DC

## 2016-09-18 NOTE — Telephone Encounter (Signed)
I left a voice message for pt, 1 script ready for pick up here at front office and other [facility]

## 2016-09-18 NOTE — Telephone Encounter (Signed)
Call in Zegerid to take one a day, #90 with 3 rf

## 2016-09-18 NOTE — Telephone Encounter (Signed)
Did not see Zegerid on current medication list, can we send in new script?

## 2016-10-10 ENCOUNTER — Other Ambulatory Visit: Payer: Self-pay | Admitting: Family Medicine

## 2016-10-11 MED ORDER — METHYLPHENIDATE HCL 10 MG PO TABS: 10.0000 mg | ORAL_TABLET | Freq: Two times a day (BID) | ORAL | 0 refills | Status: DC

## 2016-10-11 NOTE — Telephone Encounter (Signed)
Try short acting Ritalin bid (written)

## 2016-10-31 ENCOUNTER — Other Ambulatory Visit: Payer: Self-pay | Admitting: Student

## 2016-10-31 DIAGNOSIS — M546 Pain in thoracic spine: Secondary | ICD-10-CM

## 2016-11-06 ENCOUNTER — Other Ambulatory Visit: Payer: Self-pay | Admitting: Family Medicine

## 2016-11-06 MED ORDER — METHYLPHENIDATE HCL 10 MG PO TABS: 10.0000 mg | ORAL_TABLET | Freq: Two times a day (BID) | ORAL | 0 refills | Status: DC

## 2016-11-06 NOTE — Telephone Encounter (Signed)
Done for 3 months

## 2016-11-16 ENCOUNTER — Other Ambulatory Visit: Payer: BLUE CROSS/BLUE SHIELD

## 2017-01-24 ENCOUNTER — Other Ambulatory Visit: Payer: Self-pay | Admitting: Family Medicine

## 2017-01-28 ENCOUNTER — Encounter: Payer: Self-pay | Admitting: Family Medicine

## 2017-01-28 MED ORDER — METHYLPHENIDATE HCL 10 MG PO TABS: 10.0000 mg | ORAL_TABLET | Freq: Two times a day (BID) | ORAL | 0 refills | Status: DC

## 2017-01-28 MED ORDER — METHYLPHENIDATE HCL 10 MG PO TABS
10.0000 mg | ORAL_TABLET | Freq: Three times a day (TID) | ORAL | 0 refills | Status: DC | PRN
Start: 2017-01-28 — End: 2017-02-19

## 2017-01-28 NOTE — Telephone Encounter (Signed)
I wrote for TID for one month and we will see how it goes

## 2017-01-28 NOTE — Telephone Encounter (Signed)
Done

## 2017-01-28 NOTE — Telephone Encounter (Signed)
Script is ready for pick up here at front office and I left a voice message for pt.  

## 2017-02-06 ENCOUNTER — Encounter: Payer: Self-pay | Admitting: Family Medicine

## 2017-02-12 ENCOUNTER — Ambulatory Visit (INDEPENDENT_AMBULATORY_CARE_PROVIDER_SITE_OTHER): Payer: BLUE CROSS/BLUE SHIELD | Admitting: Family Medicine

## 2017-02-12 ENCOUNTER — Encounter: Payer: Self-pay | Admitting: Family Medicine

## 2017-02-12 VITALS — BP 122/86 | Temp 100.2°F

## 2017-02-12 DIAGNOSIS — J029 Acute pharyngitis, unspecified: Secondary | ICD-10-CM

## 2017-02-12 DIAGNOSIS — R509 Fever, unspecified: Secondary | ICD-10-CM

## 2017-02-12 LAB — POCT INFLUENZA A/B
Influenza A, POC: NEGATIVE
Influenza B, POC: NEGATIVE

## 2017-02-12 MED ORDER — CEPHALEXIN 500 MG PO CAPS: 500.0000 mg | ORAL_CAPSULE | Freq: Three times a day (TID) | ORAL | 0 refills | Status: AC

## 2017-02-12 NOTE — Progress Notes (Signed)
   Subjective:    Patient ID: Diana Nelson, female    DOB: 1981/02/09, 36 y.o.   MRN: [id number]  HPI Here for 4 days of low grade fevers, chills, a bad ST, and a dry cough. No NVD. Drinking fluids and taking Ibuprofen.    Review of Systems  Constitutional: Positive for chills, diaphoresis and fever.  HENT: Positive for sore throat. Negative for congestion, ear pain, postnasal drip, sinus pain and sinus pressure.   Eyes: Negative.   Respiratory: Positive for cough.   Gastrointestinal: Negative.        Objective:   Physical Exam  Constitutional:  Appears ill   HENT:  Right Ear: External ear normal.  Left Ear: External ear normal.  Nose: Nose normal.  Posterior OP is red without exudate   Eyes: Conjunctivae are normal.  Neck: Neck supple. No thyromegaly present.  Shotty tender AC nodes   Pulmonary/Chest: Effort normal and breath sounds normal. No respiratory distress. She has no wheezes. She has no rales.          Assessment & Plan:  Pharyngitis, probable strep throat. Treat with Keflex.  Gershon Crane, MD

## 2017-02-12 NOTE — Patient Instructions (Signed)
WE NOW OFFER   [facility] FAST TRACK!!!  SAME DAY Appointments for ACUTE CARE  Such as: Sprains, Injuries, cuts, abrasions, rashes, muscle pain, joint pain, back pain Colds, flu, sore throats, headache, allergies, cough, fever  Ear pain, sinus and eye infections Abdominal pain, nausea, vomiting, diarrhea, upset stomach Animal/insect bites  3 Easy Ways to Schedule: Walk-In Scheduling Call in scheduling Mychart Sign-up: [email or web address]         

## 2017-02-19 ENCOUNTER — Other Ambulatory Visit: Payer: Self-pay | Admitting: Family Medicine

## 2017-02-19 MED ORDER — METHYLPHENIDATE HCL 10 MG PO TABS: 10.0000 mg | ORAL_TABLET | Freq: Three times a day (TID) | ORAL | 0 refills | Status: DC | PRN

## 2017-02-19 MED ORDER — METHYLDOPA 250 MG PO TABS: 250.0000 mg | ORAL_TABLET | Freq: Two times a day (BID) | ORAL | 3 refills | Status: DC

## 2017-02-19 NOTE — Telephone Encounter (Signed)
Call in #90 with 5 rf 

## 2017-02-19 NOTE — Telephone Encounter (Signed)
Done

## 2017-02-26 ENCOUNTER — Emergency Department (HOSPITAL_COMMUNITY)
Admission: EM | Admit: 2017-02-26 | Discharge: 2017-02-26 | Disposition: A | Discharge status: Home / Self Care | Payer: BLUE CROSS/BLUE SHIELD | Attending: Emergency Medicine | Admitting: Emergency Medicine

## 2017-02-26 ENCOUNTER — Encounter (HOSPITAL_COMMUNITY): Payer: Self-pay | Admitting: Emergency Medicine

## 2017-02-26 DIAGNOSIS — Y9389 Activity, other specified: Secondary | ICD-10-CM | POA: Insufficient documentation

## 2017-02-26 DIAGNOSIS — I10 Essential (primary) hypertension: Secondary | ICD-10-CM | POA: Diagnosis not present

## 2017-02-26 DIAGNOSIS — M70812 Other soft tissue disorders related to use, overuse and pressure, left shoulder: Secondary | ICD-10-CM | POA: Insufficient documentation

## 2017-02-26 DIAGNOSIS — J45909 Unspecified asthma, uncomplicated: Secondary | ICD-10-CM | POA: Insufficient documentation

## 2017-02-26 DIAGNOSIS — M779 Enthesopathy, unspecified: Secondary | ICD-10-CM

## 2017-02-26 DIAGNOSIS — Z79899 Other long term (current) drug therapy: Secondary | ICD-10-CM | POA: Diagnosis not present

## 2017-02-26 DIAGNOSIS — F17210 Nicotine dependence, cigarettes, uncomplicated: Secondary | ICD-10-CM | POA: Insufficient documentation

## 2017-02-26 DIAGNOSIS — M7592 Shoulder lesion, unspecified, left shoulder: Secondary | ICD-10-CM | POA: Diagnosis not present

## 2017-02-26 DIAGNOSIS — M67912 Unspecified disorder of synovium and tendon, left shoulder: Secondary | ICD-10-CM

## 2017-02-26 DIAGNOSIS — M79602 Pain in left arm: Secondary | ICD-10-CM | POA: Diagnosis present

## 2017-02-26 MED ORDER — IBUPROFEN 600 MG PO TABS: 600.0000 mg | ORAL_TABLET | Freq: Four times a day (QID) | ORAL | 0 refills | Status: DC | PRN

## 2017-02-26 MED ORDER — NAPROXEN 500 MG PO TABS
500.0000 mg | ORAL_TABLET | Freq: Once | ORAL | Status: AC
  Administered 2017-02-26: 500 mg via ORAL
  Filled 2017-02-26: qty 1

## 2017-02-26 NOTE — ED Provider Notes (Signed)
[facility]-EMERGENCY DEPT Provider Note   CSN: [id number] Arrival date & time: 02/26/17  1118     History   Chief Complaint Chief Complaint  Patient presents with  . Arm Pain    L arm    HPI Diana Nelson is a 35 y.o. female.  HPI Patient comes in with chief complaint of shoulder pain. Patient reports that she started hurting over her left shoulder about 4 weeks ago. Patient is unsure what caused her injury / pain, but she admits to lifting some free weights and having to lift boxes over her shoulder at work. Patient reports that certain range of motion exacerbates her pain and she wakes up in pain in the middle the night. Patient has associated tingling over her left forearm with certain movements. No neck pain.  Past Medical History:  Diagnosis Date  . Anxiety    on xanax  . Asthma    uses once or twice a day   . Hypertension   . Migraines     Patient Active Problem List   Diagnosis Date Noted  . SVD (spontaneous vaginal delivery) 09/10/2016  . Chronic hypertension affecting pregnancy 09/09/2016  . AMA (advanced maternal age) multigravida 35+, third trimester 09/07/2016  . Current every day smoker 09/07/2016  . Obesity (BMI 30-39.9) 09/07/2016  . Consanguinity 09/07/2016  . Eczema 09/07/2016  . Depression with anxiety 12/29/2015  . Attention deficit hyperactivity disorder (ADHD) 12/29/2015  . Low back pain 02/06/2015  . HTN (hypertension) 02/06/2015  . GERD 01/12/2009  . 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 TENDINITIS 04/29/2008  . Asthma 07/31/2007  . URTICARIA NOS 03/12/2007  . 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 Migraine headache 09/24/2006    Past Surgical History:  Procedure Laterality Date  . knee surgeries     x3  . SPINAL FUSION     2006   . TONSILLECTOMY      OB History    Gravida Para Term Preterm AB Living   SAB TAB Ectopic Multiple Live Births         0 1       Home Medications    Prior to Admission  medications   Medication Sig Start Date End Date Taking? Authorizing Provider  albuterol (PROVENTIL HFA) 108 (90 Base) MCG/ACT inhaler INHALE 2 PUFFS INTO THE LUNGS EVERY 4 (FOUR) HOURS AS NEEDED FOR WHEEZING OR SHORTNESS OF BREATH. 04/09/16   Nelwyn Salisbury, MD  ALPRAZolam Prudy Feeler) 0.25 MG tablet TAKE 1 TABLET BY MOUTH 3 TIMES DAILY AS NEEDED 02/20/17   Nelwyn Salisbury, MD  fluticasone [facility]) 50 MCG/ACT nasal spray Place 2 sprays into both nostrils daily. 02/06/15   Nelwyn Salisbury, MD  ibuprofen (ADVIL,MOTRIN) 600 MG tablet Take 1 tablet (600 mg total) by mouth every 6 (six) hours as needed. 02/26/17   Derwood Kaplan, MD  loratadine (CLARITIN) 10 MG tablet Take 10 mg by mouth at bedtime as needed for allergies.     [provider]  methyldopa (ALDOMET) 250 MG tablet Take 1 tablet (250 mg total) by mouth 2 (two) times daily. 02/19/17   Nelwyn Salisbury, MD  methylphenidate (RITALIN) 10 MG tablet Take 1 tablet (10 mg total) by mouth 3 (three) times daily as needed. 02/19/17   Nelwyn Salisbury, MD  omeprazole-sodium bicarbonate (ZEGERID) 40-1100 MG capsule Take 1 capsule by mouth daily before breakfast. 09/18/16   Nelwyn Salisbury, MD    Family History  Family History  Problem Relation Age of Onset  . Birth defects Daughter        pectus excavatum surgery at age 43    Social History Social History  Substance Use Topics  . Smoking status: Current Every Day Smoker    Packs/day: 1.00    Types: Cigarettes    Last attempt to quit: 06/16/2012  . Smokeless tobacco: Never Used  . Alcohol use No     Allergies   Azithromycin and Lamictal [lamotrigine]   Review of Systems Review of Systems  Constitutional: Positive for activity change.  Musculoskeletal: Positive for arthralgias.  Skin: Negative for wound.  Neurological: Positive for numbness.     Physical Exam Updated Vital Signs BP (!) 144/71 (BP Location: Left Arm)   Pulse (!) 102   Temp 98.6 F (37 C) (Oral)   Resp 16   LMP  02/07/2017 (Exact Date)   SpO2 99%   Physical Exam  Constitutional: She is oriented to person, place, and time. She appears well-developed.  HENT:  Head: Normocephalic and atraumatic.  Eyes: EOM are normal.  Neck: Normal range of motion. Neck supple.  Cardiovascular: Normal rate.   Pulmonary/Chest: Effort normal.  Abdominal: Bowel sounds are normal.  Musculoskeletal:  L shoulder has tenderness over the [facility] joint. Pt has tenderness with ACTIVE forward flexion and abduction at about 30 degrees. With passive ROM she starts having pain above the shoulder plian. Pt also has tenderness with internal rotation.  Neurological: She is alert and oriented to person, place, and time.  Skin: Skin is warm and dry.  Nursing note and vitals reviewed.    ED Treatments / Results  Labs (all labs ordered are listed, but only abnormal results are displayed) Labs Reviewed - No data to display  EKG  EKG Interpretation None       Radiology No results found.  Procedures Procedures (including critical care time)  Medications Ordered in ED Medications  naproxen (NAPROSYN) tablet 500 mg (500 mg Oral Given 02/26/17 1425)     Initial Impression / Assessment and Plan / ED Course  I have reviewed the triage vital signs and the nursing notes.  Pertinent labs & imaging results that were available during my care of the patient were reviewed by me and considered in my medical decision making (see chart for details).    Patient comes in with chief complaint of left shoulder pain. Clinically it appears that patient has having impingement syndrome of the left shoulder, or rotator cuff tear. The impingement syndrome could be because of shoulder swelling. We will start aggressive Rice treatment. Patient has been advised to follow up with orthopedist in 2 weeks.   Final Clinical Impressions(s) / ED Diagnoses   Final diagnoses:  Rotator cuff disorder, left  Tendinitis    New Prescriptions New  Prescriptions   IBUPROFEN (ADVIL,MOTRIN) 600 MG TABLET    Take 1 tablet (600 mg total) by mouth every 6 (six) hours as needed.     Derwood Kaplan, MD 02/26/17 [phone]

## 2017-02-26 NOTE — ED Triage Notes (Signed)
Pt c/o L should pain radiating into arm x 2 weeks. Pt states she has difficulty raising arm above shoulder level and has intermittent numbness to L hand. Pt states she has taken anti-inflammatory meds but pain continues to worsen.

## 2017-02-26 NOTE — ED Notes (Signed)
Called for room x1.no answer. 

## 2017-02-26 NOTE — Discharge Instructions (Signed)
Please start conservative treatment that we have discussed (RICE treatment). Continue with gentle passive range of motion of your shoulder.  See the Orthopedist in 2 weeks.

## 2017-03-11 ENCOUNTER — Encounter: Payer: Self-pay | Admitting: Family Medicine

## 2017-03-11 NOTE — Telephone Encounter (Signed)
I could do this referral but she needs to get me a name and phone number

## 2017-03-11 NOTE — Telephone Encounter (Signed)
See her other phone message about this referral

## 2017-03-12 ENCOUNTER — Emergency Department (HOSPITAL_COMMUNITY): Payer: Medicaid Other

## 2017-03-12 ENCOUNTER — Encounter (HOSPITAL_COMMUNITY): Payer: Self-pay | Admitting: Emergency Medicine

## 2017-03-12 ENCOUNTER — Emergency Department (HOSPITAL_COMMUNITY)
Admission: EM | Admit: 2017-03-12 | Discharge: 2017-03-12 | Disposition: A | Discharge status: Home / Self Care | Payer: Medicaid Other | Attending: Emergency Medicine | Admitting: Emergency Medicine

## 2017-03-12 DIAGNOSIS — Z79899 Other long term (current) drug therapy: Secondary | ICD-10-CM | POA: Diagnosis not present

## 2017-03-12 DIAGNOSIS — S4992XA Unspecified injury of left shoulder and upper arm, initial encounter: Secondary | ICD-10-CM | POA: Diagnosis present

## 2017-03-12 DIAGNOSIS — I10 Essential (primary) hypertension: Secondary | ICD-10-CM | POA: Insufficient documentation

## 2017-03-12 DIAGNOSIS — Y929 Unspecified place or not applicable: Secondary | ICD-10-CM | POA: Insufficient documentation

## 2017-03-12 DIAGNOSIS — S42252A Displaced fracture of greater tuberosity of left humerus, initial encounter for closed fracture: Secondary | ICD-10-CM | POA: Diagnosis not present

## 2017-03-12 DIAGNOSIS — R200 Anesthesia of skin: Secondary | ICD-10-CM | POA: Insufficient documentation

## 2017-03-12 DIAGNOSIS — Y999 Unspecified external cause status: Secondary | ICD-10-CM | POA: Diagnosis not present

## 2017-03-12 DIAGNOSIS — J45909 Unspecified asthma, uncomplicated: Secondary | ICD-10-CM | POA: Insufficient documentation

## 2017-03-12 DIAGNOSIS — X58XXXA Exposure to other specified factors, initial encounter: Secondary | ICD-10-CM | POA: Insufficient documentation

## 2017-03-12 DIAGNOSIS — Y939 Activity, unspecified: Secondary | ICD-10-CM | POA: Diagnosis not present

## 2017-03-12 DIAGNOSIS — F17210 Nicotine dependence, cigarettes, uncomplicated: Secondary | ICD-10-CM | POA: Insufficient documentation

## 2017-03-12 MED ORDER — TRAMADOL HCL 50 MG PO TABS: 50.0000 mg | ORAL_TABLET | Freq: Four times a day (QID) | ORAL | 0 refills | Status: DC | PRN

## 2017-03-12 NOTE — ED Triage Notes (Signed)
Pt complaint of continued left shoulder pain; seen her for same 2 weeks ago.

## 2017-03-12 NOTE — ED Notes (Signed)
Bed: WTR7 Expected date:  Expected time:  Means of arrival:  Comments: 

## 2017-03-12 NOTE — ED Provider Notes (Signed)
[facility]-EMERGENCY DEPT Provider Note   CSN: [id number] Arrival date & time: 03/12/17  4098     History   Chief Complaint Chief Complaint  Patient presents with  . Shoulder Pain    HPI Diana Nelson is a 36 y.o. female who presents to the Ed with chief complaint of left shoulder pain.  Patient was seen for the same on 02/26/2017 and diagnosed with rotator cuff tendinitis.  She states that she is having significant difficulties performing her ADLs.  She has an infant is having difficulty caring for him and can only use her right arm secondary to pain.  Her pain is worse with movement of the left shoulder.  She has noticed some numbness and tingling in the ulnar nerve distribution.  She states that when she rolls onto the shoulder it wakes her at night.  She has been using ibuprofen without any relief of her symptoms HPI  Past Medical History:  Diagnosis Date  . Anxiety    on xanax  . Asthma    uses once or twice a day   . Hypertension   . Migraines     Patient Active Problem List   Diagnosis Date Noted  . SVD (spontaneous vaginal delivery) 09/10/2016  . Chronic hypertension affecting pregnancy 09/09/2016  . AMA (advanced maternal age) multigravida 35+, third trimester 09/07/2016  . Current every day smoker 09/07/2016  . Obesity (BMI 30-39.9) 09/07/2016  . Consanguinity 09/07/2016  . Eczema 09/07/2016  . Depression with anxiety 12/29/2015  . Attention deficit hyperactivity disorder (ADHD) 12/29/2015  . Low back pain 02/06/2015  . HTN (hypertension) 02/06/2015  . GERD 01/12/2009  . 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 TENDINITIS 04/29/2008  . Asthma 07/31/2007  . URTICARIA NOS 03/12/2007  . 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 Migraine headache 09/24/2006    Past Surgical History:  Procedure Laterality Date  . knee surgeries     x3  . SPINAL FUSION     2006   . TONSILLECTOMY      OB History    Gravida Para Term Preterm AB  Living   3 3 3     1    SAB TAB Ectopic Multiple Live Births         0 1       Home Medications    Prior to Admission medications   Medication Sig Start Date End Date Taking? Authorizing Provider  albuterol (PROVENTIL HFA) 108 (90 Base) MCG/ACT inhaler INHALE 2 PUFFS INTO THE LUNGS EVERY 4 (FOUR) HOURS AS NEEDED FOR WHEEZING OR SHORTNESS OF BREATH. 04/09/16   Nelwyn Salisbury, MD  ALPRAZolam Prudy Feeler) 0.25 MG tablet TAKE 1 TABLET BY MOUTH 3 TIMES DAILY AS NEEDED 02/20/17   Nelwyn Salisbury, MD  fluticasone [facility]) 50 MCG/ACT nasal spray Place 2 sprays into both nostrils daily. 02/06/15   Nelwyn Salisbury, MD  ibuprofen (ADVIL,MOTRIN) 600 MG tablet Take 1 tablet (600 mg total) by mouth every 6 (six) hours as needed. 02/26/17   Derwood Kaplan, MD  loratadine (CLARITIN) 10 MG tablet Take 10 mg by mouth at bedtime as needed for allergies.     [provider]  methyldopa (ALDOMET) 250 MG tablet Take 1 tablet (250 mg total) by mouth 2 (two) times daily. 02/19/17   Nelwyn Salisbury, MD  methylphenidate (RITALIN) 10 MG tablet Take 1 tablet (10 mg total) by mouth 3 (three) times daily as needed. 02/19/17   Nelwyn Salisbury, MD  omeprazole-sodium bicarbonate (ZEGERID)  40-1100 MG capsule Take 1 capsule by mouth daily before breakfast. 09/18/16   Nelwyn SalisburyFry, Stephen A, MD    Family History Family History  Problem Relation Age of Onset  . Birth defects Daughter        pectus excavatum surgery at age 614    Social History Social History  Substance Use Topics  . Smoking status: Current Every Day Smoker    Packs/day: 1.00    Types: Cigarettes    Last attempt to quit: 06/16/2012  . Smokeless tobacco: Never Used  . Alcohol use No     Allergies   Azithromycin and Lamictal [lamotrigine]   Review of Systems Review of Systems  Ten systems reviewed and are negative for acute change, except as noted in the HPI.    Physical Exam Updated Vital Signs BP 138/83 (BP Location: Right Arm)   Pulse 90   Temp  98 F (36.7 C) (Oral)   Resp 18   SpO2 98%   Physical Exam  Constitutional: She is oriented to person, place, and time. She appears well-developed and well-nourished. No distress.  HENT:  Head: Normocephalic and atraumatic.  Eyes: Conjunctivae are normal. No scleral icterus.  Neck: Normal range of motion.  Cardiovascular: Normal rate, regular rhythm and normal heart sounds.  Exam reveals no gallop and no friction rub.   No murmur heard. Pulmonary/Chest: Effort normal and breath sounds normal. No respiratory distress.  Abdominal: Soft. Bowel sounds are normal. She exhibits no distension and no mass. There is no tenderness. There is no guarding.  Musculoskeletal:  ttp in the Muscle of the Rotator cuff on the left. Decreased ROM secondary to pain. equeal grip strength. Pain with both passive and active ROM  Neurological: She is alert and oriented to person, place, and time.  Skin: Skin is warm and dry. She is not diaphoretic.  Psychiatric: Her behavior is normal.  Nursing note and vitals reviewed.    ED Treatments / Results  Labs (all labs ordered are listed, but only abnormal results are displayed) Labs Reviewed - No data to display  EKG  EKG Interpretation None       Radiology No results found.  Procedures Procedures (including critical care time)  Medications Ordered in ED Medications - No data to display   Initial Impression / Assessment and Plan / ED Course  I have reviewed the triage vital signs and the nursing notes.  Pertinent labs & imaging results that were available during my care of the patient were reviewed by me and considered in my medical decision making (see chart for details).  Clinical Course as of Mar 12 1049  Wed Mar 12, 2017  1042 Patient with abnormal xray of the left shoulder. I will proceed with ct scan. Patiet given in sign out to Dr. Patria Maneampos. I have prescribed tramadol and the patient is not currently nursing  [AH]    Clinical Course User  Index [AH] Arthor CaptainHarris, Ashlyne Olenick, PA-C      Final Clinical Impressions(s) / ED Diagnoses   Final diagnoses:  None    New Prescriptions New Prescriptions   No medications on file     Arthor CaptainHarris, Eulan Heyward, PA-C 03/12/17 1051    Azalia Bilisampos, Kevin, MD 03/13/17 [phone]

## 2017-03-13 ENCOUNTER — Ambulatory Visit (INDEPENDENT_AMBULATORY_CARE_PROVIDER_SITE_OTHER): Payer: Medicaid Other | Admitting: Orthopedic Surgery

## 2017-03-13 ENCOUNTER — Encounter (INDEPENDENT_AMBULATORY_CARE_PROVIDER_SITE_OTHER): Payer: Self-pay | Admitting: Orthopedic Surgery

## 2017-03-13 DIAGNOSIS — S4292XA Fracture of left shoulder girdle, part unspecified, initial encounter for closed fracture: Secondary | ICD-10-CM | POA: Diagnosis not present

## 2017-03-14 ENCOUNTER — Encounter (INDEPENDENT_AMBULATORY_CARE_PROVIDER_SITE_OTHER): Payer: Self-pay | Admitting: Orthopedic Surgery

## 2017-03-14 LAB — VITAMIN D 25 HYDROXY (VIT D DEFICIENCY, FRACTURES): Vit D, 25-Hydroxy: 27 ng/mL — ABNORMAL LOW (ref 30–100)

## 2017-03-14 LAB — EXTRA LAV TOP TUBE
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
## 2017-03-14 NOTE — Progress Notes (Signed)
Please call patient with results. Thanks she needs to consult with her OB/GYN about vitamin D supplement strategies while she is pregnant

## 2017-03-19 ENCOUNTER — Other Ambulatory Visit (INDEPENDENT_AMBULATORY_CARE_PROVIDER_SITE_OTHER): Payer: Self-pay

## 2017-03-19 MED ORDER — ACETAMINOPHEN-CODEINE #3 300-30 MG PO TABS: 1.0000 | ORAL_TABLET | Freq: Three times a day (TID) | ORAL | 0 refills | Status: DC | PRN

## 2017-03-27 ENCOUNTER — Ambulatory Visit (INDEPENDENT_AMBULATORY_CARE_PROVIDER_SITE_OTHER): Payer: Medicaid Other | Admitting: Orthopedic Surgery

## 2017-03-27 ENCOUNTER — Encounter (INDEPENDENT_AMBULATORY_CARE_PROVIDER_SITE_OTHER): Payer: Self-pay | Admitting: Orthopedic Surgery

## 2017-03-27 ENCOUNTER — Ambulatory Visit (INDEPENDENT_AMBULATORY_CARE_PROVIDER_SITE_OTHER): Payer: Medicaid Other

## 2017-03-27 ENCOUNTER — Ambulatory Visit (INDEPENDENT_AMBULATORY_CARE_PROVIDER_SITE_OTHER): Payer: Self-pay

## 2017-03-27 DIAGNOSIS — S42255D Nondisplaced fracture of greater tuberosity of left humerus, subsequent encounter for fracture with routine healing: Secondary | ICD-10-CM | POA: Diagnosis not present

## 2017-03-27 NOTE — Progress Notes (Signed)
   Post-Op Visit Note   Patient: Diana NuttingHolly K D'Amato           Date of Birth: 02-26-1981           MRN: [id number] Visit Date: 03/27/2017 PCP: Nelwyn SalisburyFry, Stephen A, MD   Assessment & Plan:  Chief Complaint:  Chief Complaint  Patient presents with  . Left Shoulder - Follow-up, Fracture   Visit Diagnoses:  1. Closed nondisplaced fracture of greater tuberosity of left humerus with routine healing, subsequent encounter     Plan: Impression is left shoulder pain with greater tuberosity stress reaction.  There is been no displacement of the fracture.  Radiographs appear stable.  She is now 9 weeks out from her injury.  Plan is to continue with Tylenol No. 3 which has been reviewed by her OB/GYN..  We will see her back in 3 weeks for repeat clinical examination and possible ultrasound examination.  Her injury was not traumatic but she does have very low vitamin D.  This is being supplemented.  She may need further imaging after delivery  Follow-Up Instructions: Return in about 3 weeks (around 04/17/2017).   Orders:  Orders Placed This Encounter  Procedures  . XR Shoulder Left  . XR Shoulder Left   No orders of the defined types were placed in this encounter.   Imaging: Xr Shoulder Left  Result Date: 03/27/2017 AP lateral and outlet view left shoulder reviewed.  No change in fracture alignment of the nondisplaced greater tuberosity fracture is present.  This is in comparison to radiographs May 3 weeks ago.  No other fracture dislocation or calcification noted   PMFS History: Patient Active Problem List   Diagnosis Date Noted  . SVD (spontaneous vaginal delivery) 09/10/2016  . Chronic hypertension affecting pregnancy 09/09/2016  . AMA (advanced maternal age) multigravida 35+, third trimester 09/07/2016  . Current every day smoker 09/07/2016  . Obesity (BMI 30-39.9) 09/07/2016  . Consanguinity 09/07/2016  . Eczema 09/07/2016  . Depression with anxiety 12/29/2015  . Attention deficit  hyperactivity disorder (ADHD) 12/29/2015  . Low back pain 02/06/2015  . HTN (hypertension) 02/06/2015  . GERD 01/12/2009  . 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 TENDINITIS 04/29/2008  . Asthma 07/31/2007  . URTICARIA NOS 03/12/2007  . 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 Migraine headache 09/24/2006   Past Medical History:  Diagnosis Date  . Anxiety    on xanax  . Asthma    uses once or twice a day   . Hypertension   . Migraines     Family History  Problem Relation Age of Onset  . Birth defects Daughter        pectus excavatum surgery at age 38    Past Surgical History:  Procedure Laterality Date  . knee surgeries     x3  . SPINAL FUSION     2006   . TONSILLECTOMY     Social History   Occupational History  . Not on file  Tobacco Use  . Smoking status: Current Every Day Smoker    Packs/day: 1.00    Types: Cigarettes    Last attempt to quit: 06/16/2012    Years since quitting: 4.7  . Smokeless tobacco: Never Used  Substance and Sexual Activity  . Alcohol use: No    Alcohol/week: 0.0 oz  . Drug use: No  . Sexual activity: Not on file

## 2017-04-14 ENCOUNTER — Other Ambulatory Visit (INDEPENDENT_AMBULATORY_CARE_PROVIDER_SITE_OTHER): Payer: Self-pay | Admitting: Orthopedic Surgery

## 2017-04-14 ENCOUNTER — Encounter (HOSPITAL_COMMUNITY): Payer: Self-pay

## 2017-04-14 ENCOUNTER — Other Ambulatory Visit: Payer: Self-pay

## 2017-04-14 LAB — OB RESULTS CONSOLE RPR: RPR: NONREACTIVE

## 2017-04-14 LAB — OB RESULTS CONSOLE ABO/RH: RH Type: POSITIVE

## 2017-04-14 LAB — OB RESULTS CONSOLE ANTIBODY SCREEN: ANTIBODY SCREEN: NEGATIVE

## 2017-04-14 LAB — OB RESULTS CONSOLE HEPATITIS B SURFACE ANTIGEN: HEP B S AG: NEGATIVE

## 2017-04-14 LAB — OB RESULTS CONSOLE GC/CHLAMYDIA
CHLAMYDIA, DNA PROBE: NEGATIVE
GC PROBE AMP, GENITAL: NEGATIVE

## 2017-04-14 LAB — OB RESULTS CONSOLE HIV ANTIBODY (ROUTINE TESTING): HIV: NONREACTIVE

## 2017-04-14 LAB — OB RESULTS CONSOLE RUBELLA ANTIBODY, IGM: Rubella: IMMUNE

## 2017-04-14 NOTE — Telephone Encounter (Signed)
Dean patient 

## 2017-04-14 NOTE — Telephone Encounter (Signed)
Okay to refill.  Please do narcotics check thanks

## 2017-04-14 NOTE — Telephone Encounter (Signed)
Ok to rf? 

## 2017-04-15 MED ORDER — ACETAMINOPHEN-CODEINE #3 300-30 MG PO TABS: 1.0000 | ORAL_TABLET | Freq: Three times a day (TID) | ORAL | 0 refills | Status: DC | PRN

## 2017-04-17 ENCOUNTER — Ambulatory Visit (INDEPENDENT_AMBULATORY_CARE_PROVIDER_SITE_OTHER): Payer: Medicaid Other | Admitting: Orthopedic Surgery

## 2017-04-25 ENCOUNTER — Encounter (INDEPENDENT_AMBULATORY_CARE_PROVIDER_SITE_OTHER): Payer: Self-pay | Admitting: Orthopedic Surgery

## 2017-04-25 ENCOUNTER — Encounter: Payer: Self-pay | Admitting: Family Medicine

## 2017-04-29 ENCOUNTER — Ambulatory Visit (HOSPITAL_COMMUNITY): Payer: Medicaid Other

## 2017-04-29 ENCOUNTER — Other Ambulatory Visit: Payer: Self-pay | Admitting: Family Medicine

## 2017-04-30 ENCOUNTER — Ambulatory Visit (HOSPITAL_COMMUNITY)
Admission: RE | Admit: 2017-04-30 | Discharge: 2017-04-30 | Disposition: A | Discharge status: Home / Self Care | Payer: Medicaid Other | Source: Ambulatory Visit | Attending: Obstetrics and Gynecology | Admitting: Obstetrics and Gynecology

## 2017-04-30 ENCOUNTER — Encounter: Payer: Self-pay | Admitting: Family Medicine

## 2017-04-30 ENCOUNTER — Other Ambulatory Visit (HOSPITAL_COMMUNITY): Payer: Self-pay | Admitting: *Deleted

## 2017-04-30 DIAGNOSIS — Z3A12 12 weeks gestation of pregnancy: Secondary | ICD-10-CM | POA: Insufficient documentation

## 2017-04-30 DIAGNOSIS — R899 Unspecified abnormal finding in specimens from other organs, systems and tissues: Secondary | ICD-10-CM

## 2017-04-30 DIAGNOSIS — O09521 Supervision of elderly multigravida, first trimester: Secondary | ICD-10-CM

## 2017-04-30 DIAGNOSIS — O289 Unspecified abnormal findings on antenatal screening of mother: Secondary | ICD-10-CM

## 2017-04-30 NOTE — Telephone Encounter (Signed)
Sent to PCP for approval.  

## 2017-04-30 NOTE — Telephone Encounter (Signed)
Okay. Increase the Xanax to 0.5 mg to take every 8 hours prn anxiety, call in #90 with 5 rf

## 2017-04-30 NOTE — Progress Notes (Signed)
Appointment Date: 04/30/2017 DOB: 04-24-1981 Referring Provider: Jaymes Nelson, Naima, MD Attending: Dr. Charlsie MerlesMark Newman  Ms. Diana NuttingHolly K Nelson and her partner, Diana NeverJason Nelson, were seen for genetic counseling because of a maternal age of 36 y.o. and a Panorama with a low fetal fraction and a 1 in 5717 risk for fetal aneuploidy.     In summary:  Discussed AMA and associated risk for fetal aneuploidy  Reviewed results of NIPS (Pan[facility])- low fetal fraction ? Low fetal fraction- associated with 1 in 17 risk for underlying fetal aneuploidy (trisomy 7118, trisomy 6213, or triploidy) in pregnancy ? Specific analysis for fetal aneuploidy conditions unable to be performed due to low fetal fraction ? Reviewed other possible explanations for low fetal fraction   Discussed options for screening ? First trimester screen - scheduled today ? Repeat NIPS through different methodology- reviewed chance for similar no results due to low fetal fraction ? Ultrasound - scheduled today for 17-[redacted] weeks gestation  Discussed diagnostic testing options ? [facility] - Declined ? Amniocentesis - will consider pending results of the first screen and anatomy ultrasound  Reviewed family history concerns  Advanced paternal age  Consanguinity  Patient and FOB are first cousins  Offered expanded carrier screening  Patient unsure, will contact genetic counselor if desired  Discussed carrier screening options - declined for now  CF  SMA  Hemoglobinopathies  They were counseled regarding maternal age and the association with risk for chromosome conditions due to nondisjunction with aging of the ova.   We reviewed chromosomes, nondisjunction, and the associated 1 in 50 risk for fetal aneuploidy related to a maternal age of 36 y.o. at 7912 weeks gestation.  They were counseled that the risk for aneuploidy decreases as gestational age increases, accounting for those pregnancies which spontaneously abort.  We specifically discussed Down  syndrome (trisomy 5921), trisomies 713 and 6518, and sex chromosome aneuploidies (47,XXX and 47,XXY) including the common features and prognoses of each.   We also reviewed the results of Diana Nelson's noninvasive prenatal screening (NIPS) / cell free DNA screening performed through her OB provider. [facility] identified low fetal fraction (3.7%), which is reported to have a 1 in 17 risk for underlying fetal aneuploidy (trisomy 618, trisomy 6013, or triploidy). We reviewed the methodology of NIPS and discussed differential diagnoses for low fetal fraction including obesity, early gestational age, physiological differences in maternal blood, physiological differences in the placenta, and underlying fetal aneuploidy. Additionally, we discussed that there may be additional factors that impact fetal fraction in pregnancy that are not yet reported in the medical literature. When fetal fraction is on average less than 4%, [facility] has in recent years not been able to perform risk assessment for fetal aneuploidy conditions.  Recent outcome study data from pregnancies with low fetal [facility], when not expected to be low based on maternal weight or gestational age, identified an increased risk for underlying fetal triploidy, trisomy 1718, or trisomy 3113, with a positive predictive value of approximately 1 in 9317 (McKanna et al 2018).   We reviewed the options available now to screen for fetal aneuploidy.   They were offered the option of repeat NIPS through the same laboratory or through a different laboratory.  We discussed that NIPS can be performed at any time during a pregnancy, but that the fetal fraction would be increasing slowly in the first trimester and increase more each week after the mid second trimester.  Thus, there is still the possibility of  not obtaining a result due to low fetal fraction. We also discussed the option of first trimester screening and/or second  trimester anatomy ultrasound. We reviewed the benefits and limitations of each option. Specifically, we discussed the conditions for which each test screens, the detection rates, and false positive rates of each. They were also counseled regarding diagnostic testing via [facility] or amniocentesis. We reviewed the approximate 1 in 300-500 risk for complications from amniocentesis, including spontaneous pregnancy loss. We discussed the possible results that the tests might provide including: positive, negative, unanticipated, and no result. Finally, they were counseled regarding the cost of each option and potential out of pocket expenses.   After consideration of all the options, they elected to be scheduled for a first trimester screen.  As she was not scheduled for ultrasound today, that was scheduled for next Wednesday, the 19th of December.  Additionally, Diana Nelson was scheduled for routine anatomical survey at our office on January 29th.  She requested those appointments be at our office so that if there was a fetal concern identified, she could discuss those concerns with the MFM provider at that time, rather than have to wait for a referral.  She understands that screening tests cannot rule out all birth defects or genetic syndromes. The patient was advised of this limitation and states she still does not want diagnostic testing at this time.    Diana Nelson was provided with written information regarding cystic fibrosis (CF), spinal muscular atrophy (SMA) and hemoglobinopathies including the carrier frequency, availability of carrier screening and prenatal diagnosis if indicated.  In addition, we discussed that CF and hemoglobinopathies are routinely screened for as part of the [place] newborn screening panel.  After further discussion, she declined screening for CF, SMA and hemoglobinopathies.  Both family histories were reviewed. Diana Nelson reported that her father and the father of the baby's father, are  brothers.  We discussed that children born to a consanguineous couple are at increased risk for genetic health problems.  This increase in risk is related to the possibility of passing on recessive genes. We explained that every person carries approximately 7-10 non-working genes that when received in a double dose results in a recessive genetic condition.  In general, unrelated couples have a relatively low risk of having a child with a recessive condition because the likelihood of both parents carrying the same non-working recessive gene is low.  However, when a couple is related, they have inherited some of their genetic information from the family members they have in common, which leads to an increased chance that they may carry the same recessive gene and have a child with a recessive condition.  For first cousin unions, the risk to have a child with a birth defect, mental retardation, or genetic condition is increased approximately 2-4% above the general population risk (3-5%).  We reviewed chromosomes, genes, and recessive inheritance in detail.   Even without a known disorder in the family, couples who are closely related have an increased risk for offspring with an autosomal recessive condition.  The chance for this to occur is based upon the risk for homozygosity by descent.  They were counseled that there are a variety of genetic screening laboratories that have pan-ethnic, or expanded, carrier screening panels, which evaluate carrier status for a wide range of recessive genetic conditions. Some of these conditions are severe and actionable, but also rare; others occur more commonly, but may be less severe, and some have variable clinical presentations which  cannot be predicted prenatally. We discussed that options range from screening for a single condition to panels of more than 150 autosomal or X-linked genetic conditions. We reviewed that the prevalence of each condition varies (and often varies  with ethnicity). Thus the couples' background risk to be a carrier for each of these various conditions would range, and in some cases be very low or unknown.  We reviewed that a negative carrier screen would reduce, but not eliminate the chance to be a carrier for these conditions. We reviewed that in the event that one partner is found to be a carrier for one or more conditions, carrier screening would be available to the partner for those conditions.  We discussed the risks, benefits, and limitations of carrier screening with the couple, including the costs. Finally, we discussed that while typically there are only reproductive implications to a positive screen, we may diagnose an asymptomatic affected individual with two mutations.  While Diana Nelson initially expressed interest in the screening, her partner, Diana Nelson, was not interested in screening.  Diana Nelson will contact me should she desire to have it performed at a later date.    Diana Nelson reported that he is 38 years of age.  We discussed that advanced paternal age is defined as paternal age greater than or equal to age 28.  Recent large-scale sequencing studies have shown that approximately 80% of de novo point mutations are of paternal origin.  Many studies have demonstrated a strong correlation between increased paternal age and de novo point mutations.  It is estimated that the overall chance for a de novo mutation is ~0.5%.  We also discussed the wide range of conditions which can be caused by new dominant gene mutations.  They were counseled that genetic testing for each individual single gene condition is not warranted or available unless ultrasound or concerns lend suspicion to a specific condition.    They were also counseled that some literature suggests that APA is also associated with an increase in risk for fetal aneuploidy.  While other literature does not support this, we discussed that a specific risk for aneuploidy other than  that based on maternal age cannot be quantified. Lastly, we discussed that newer literature suggests that the risk for autism spectrum disorders (ASD) may be increased in children born to fathers of APA.  We discussed that ASDs are among the most common neurodevelopmental disorders, with approximately 1 in 85 children meeting criteria for ASD.  Approximately 80% of individuals diagnosed are female.  There is strong evidence that genetic factors play a critical role in development of ASD.  While there have been recent advances in identifying specific genetic causes of ASD, there are still many individuals for whom the etiology of the ASD is not known.  They understand that at this time there is no reliable, comprehensive genetic testing available for ASD and a specific paternal age related risk can not be quantified.  Without further information regarding the provided family history, an accurate genetic risk cannot be calculated. Further genetic counseling is warranted if more information is obtained.  Diana Nelson denied exposure to environmental toxins or chemical agents. She denied the use of alcohol or street drugs, but reports smoking daily. She was encouraged in smoking cessation.  She denied significant viral illnesses during the course of her pregnancy. Her medical and surgical histories were noncontributory.   I counseled this couple regarding the above risks and available options.  The approximate face-to-face time with  the genetic counselor was 60 minutes.  Diana Gemmaaragh Jheremy Boger, MS,  Certified Genetic Counselor

## 2017-05-01 ENCOUNTER — Other Ambulatory Visit: Payer: Self-pay

## 2017-05-01 MED ORDER — ALPRAZOLAM 0.5 MG PO TABS: ORAL_TABLET | ORAL | 5 refills | Status: DC

## 2017-05-01 NOTE — Telephone Encounter (Signed)
Okay. Increase the Xanax to 0.5 mg to take every 8 hours prn anxiety, call in #90 with 5 RF. Rx was sent.

## 2017-05-05 ENCOUNTER — Encounter (HOSPITAL_COMMUNITY): Payer: Self-pay | Admitting: *Deleted

## 2017-05-07 ENCOUNTER — Other Ambulatory Visit: Payer: Self-pay

## 2017-05-07 ENCOUNTER — Ambulatory Visit (HOSPITAL_COMMUNITY)
Admission: RE | Admit: 2017-05-07 | Discharge: 2017-05-07 | Disposition: A | Discharge status: Home / Self Care | Payer: Medicaid Other | Source: Ambulatory Visit | Attending: Obstetrics and Gynecology | Admitting: Obstetrics and Gynecology

## 2017-05-07 ENCOUNTER — Encounter (HOSPITAL_COMMUNITY): Payer: Self-pay

## 2017-05-07 ENCOUNTER — Other Ambulatory Visit (HOSPITAL_COMMUNITY): Payer: Self-pay | Admitting: Obstetrics and Gynecology

## 2017-05-07 DIAGNOSIS — O99330 Smoking (tobacco) complicating pregnancy, unspecified trimester: Secondary | ICD-10-CM | POA: Diagnosis not present

## 2017-05-07 DIAGNOSIS — R899 Unspecified abnormal finding in specimens from other organs, systems and tissues: Secondary | ICD-10-CM | POA: Diagnosis present

## 2017-05-07 DIAGNOSIS — Z3A13 13 weeks gestation of pregnancy: Secondary | ICD-10-CM | POA: Diagnosis not present

## 2017-05-07 DIAGNOSIS — O10011 Pre-existing essential hypertension complicating pregnancy, first trimester: Secondary | ICD-10-CM

## 2017-05-07 DIAGNOSIS — O99321 Drug use complicating pregnancy, first trimester: Secondary | ICD-10-CM | POA: Diagnosis not present

## 2017-05-07 DIAGNOSIS — Z3682 Encounter for antenatal screening for nuchal translucency: Secondary | ICD-10-CM

## 2017-05-07 DIAGNOSIS — O289 Unspecified abnormal findings on antenatal screening of mother: Secondary | ICD-10-CM | POA: Insufficient documentation

## 2017-05-07 DIAGNOSIS — Z843 Family history of consanguinity: Secondary | ICD-10-CM | POA: Diagnosis not present

## 2017-05-07 DIAGNOSIS — O09521 Supervision of elderly multigravida, first trimester: Secondary | ICD-10-CM | POA: Insufficient documentation

## 2017-05-07 DIAGNOSIS — G930 Cerebral cysts: Secondary | ICD-10-CM | POA: Diagnosis not present

## 2017-05-07 HISTORY — DX: Dermatitis, unspecified: L30.9

## 2017-05-07 HISTORY — DX: Gastro-esophageal reflux disease without esophagitis: K21.9

## 2017-05-08 ENCOUNTER — Other Ambulatory Visit (HOSPITAL_COMMUNITY): Payer: Self-pay | Admitting: Obstetrics and Gynecology

## 2017-05-08 ENCOUNTER — Other Ambulatory Visit: Payer: Self-pay | Admitting: Family Medicine

## 2017-05-08 DIAGNOSIS — O99330 Smoking (tobacco) complicating pregnancy, unspecified trimester: Secondary | ICD-10-CM

## 2017-05-08 DIAGNOSIS — O09521 Supervision of elderly multigravida, first trimester: Secondary | ICD-10-CM

## 2017-05-08 DIAGNOSIS — O99321 Drug use complicating pregnancy, first trimester: Secondary | ICD-10-CM

## 2017-05-08 DIAGNOSIS — O10011 Pre-existing essential hypertension complicating pregnancy, first trimester: Secondary | ICD-10-CM

## 2017-05-08 DIAGNOSIS — Z843 Family history of consanguinity: Secondary | ICD-10-CM

## 2017-05-08 DIAGNOSIS — Z3A13 13 weeks gestation of pregnancy: Secondary | ICD-10-CM

## 2017-05-08 DIAGNOSIS — R899 Unspecified abnormal finding in specimens from other organs, systems and tissues: Secondary | ICD-10-CM

## 2017-05-08 DIAGNOSIS — Z3682 Encounter for antenatal screening for nuchal translucency: Secondary | ICD-10-CM

## 2017-05-09 MED ORDER — METHYLPHENIDATE HCL 10 MG PO TABS: 10.0000 mg | ORAL_TABLET | Freq: Three times a day (TID) | ORAL | 0 refills | Status: DC | PRN

## 2017-05-09 NOTE — Telephone Encounter (Signed)
This was emailed in  

## 2017-05-09 NOTE — Telephone Encounter (Signed)
Last refilled 02/19/2017 for 90 day supply with no refills. Pt had Rx refilled this month 04/21/2017 should have pills left. Will be due in January. Sent to PCP for approval.

## 2017-05-16 ENCOUNTER — Other Ambulatory Visit (INDEPENDENT_AMBULATORY_CARE_PROVIDER_SITE_OTHER): Payer: Self-pay | Admitting: Orthopedic Surgery

## 2017-05-16 NOTE — Telephone Encounter (Signed)
FYI Patient was to follow up in 3 weeks after last OV on 11/08 to check her progress. She no showed appt on 11/29. I advised unable to refill without further clinical evaluation of her shoulder especially since patient is pregnant.

## 2017-05-19 ENCOUNTER — Other Ambulatory Visit (HOSPITAL_COMMUNITY): Payer: Self-pay

## 2017-05-20 NOTE — L&D Delivery Note (Signed)
Delivery Note At 1413 a viable female was delivered via (Presentation:  ).  APGAR: 9,9 ; weight pending.   Placenta status: delivered spontaneously and completely.  Cord: three vessels with the following complications: nuchal x1, delivered through.  Anesthesia:  None Episiotomy:  None Lacerations:  Small shallow cutaneous perineal tear Suture Repair: 3.0 vicryl Est. Blood Loss (mL):  400  Mom to postpartum.  Baby to Couplet care / Skin to Skin.  Janeece RiggersEllis K Ikechukwu Cerny 11/01/2017, 2:34 PM

## 2017-05-22 ENCOUNTER — Other Ambulatory Visit: Payer: Self-pay | Admitting: Family Medicine

## 2017-05-23 ENCOUNTER — Other Ambulatory Visit: Payer: Self-pay | Admitting: Family Medicine

## 2017-05-23 MED ORDER — ALBUTEROL SULFATE HFA 108 (90 BASE) MCG/ACT IN AERS: INHALATION_SPRAY | RESPIRATORY_TRACT | 0 refills | Status: DC

## 2017-06-12 ENCOUNTER — Other Ambulatory Visit: Payer: Self-pay | Admitting: Family Medicine

## 2017-06-13 MED ORDER — METHYLPHENIDATE HCL 10 MG PO TABS: 10.0000 mg | ORAL_TABLET | Freq: Three times a day (TID) | ORAL | 0 refills | Status: DC | PRN

## 2017-06-13 NOTE — Telephone Encounter (Signed)
Last refilled 05/09/2017   Sent to PCP for approval

## 2017-06-13 NOTE — Telephone Encounter (Signed)
Done

## 2017-06-17 ENCOUNTER — Other Ambulatory Visit (HOSPITAL_COMMUNITY): Payer: Self-pay | Admitting: Obstetrics and Gynecology

## 2017-06-17 ENCOUNTER — Ambulatory Visit (HOSPITAL_COMMUNITY)
Admission: RE | Admit: 2017-06-17 | Discharge: 2017-06-17 | Disposition: A | Discharge status: Home / Self Care | Payer: Medicaid Other | Source: Ambulatory Visit | Attending: Obstetrics and Gynecology | Admitting: Obstetrics and Gynecology

## 2017-06-17 ENCOUNTER — Other Ambulatory Visit (HOSPITAL_COMMUNITY): Payer: Self-pay | Admitting: *Deleted

## 2017-06-17 ENCOUNTER — Encounter (HOSPITAL_COMMUNITY): Payer: Self-pay

## 2017-06-17 DIAGNOSIS — O09522 Supervision of elderly multigravida, second trimester: Secondary | ICD-10-CM | POA: Insufficient documentation

## 2017-06-17 DIAGNOSIS — Z843 Family history of consanguinity: Secondary | ICD-10-CM | POA: Insufficient documentation

## 2017-06-17 DIAGNOSIS — O358XX0 Maternal care for other (suspected) fetal abnormality and damage, not applicable or unspecified: Secondary | ICD-10-CM | POA: Insufficient documentation

## 2017-06-17 DIAGNOSIS — R899 Unspecified abnormal finding in specimens from other organs, systems and tissues: Secondary | ICD-10-CM

## 2017-06-17 DIAGNOSIS — O99332 Smoking (tobacco) complicating pregnancy, second trimester: Secondary | ICD-10-CM | POA: Diagnosis not present

## 2017-06-17 DIAGNOSIS — O99322 Drug use complicating pregnancy, second trimester: Secondary | ICD-10-CM | POA: Diagnosis not present

## 2017-06-17 DIAGNOSIS — O09529 Supervision of elderly multigravida, unspecified trimester: Secondary | ICD-10-CM

## 2017-06-17 DIAGNOSIS — Z3689 Encounter for other specified antenatal screening: Secondary | ICD-10-CM | POA: Insufficient documentation

## 2017-06-17 DIAGNOSIS — O281 Abnormal biochemical finding on antenatal screening of mother: Secondary | ICD-10-CM

## 2017-06-17 DIAGNOSIS — Z3A19 19 weeks gestation of pregnancy: Secondary | ICD-10-CM | POA: Insufficient documentation

## 2017-06-17 DIAGNOSIS — O162 Unspecified maternal hypertension, second trimester: Secondary | ICD-10-CM

## 2017-06-17 DIAGNOSIS — O10012 Pre-existing essential hypertension complicating pregnancy, second trimester: Secondary | ICD-10-CM | POA: Insufficient documentation

## 2017-07-02 ENCOUNTER — Ambulatory Visit: Payer: Medicaid Other | Admitting: Family Medicine

## 2017-07-02 ENCOUNTER — Encounter: Payer: Self-pay | Admitting: Family Medicine

## 2017-07-02 VITALS — BP 130/80 | HR 105 | Temp 98.0°F | Wt 205.0 lb

## 2017-07-02 DIAGNOSIS — J018 Other acute sinusitis: Secondary | ICD-10-CM | POA: Diagnosis not present

## 2017-07-02 MED ORDER — CEPHALEXIN 500 MG PO CAPS: 500.0000 mg | ORAL_CAPSULE | Freq: Three times a day (TID) | ORAL | 0 refills | Status: AC

## 2017-07-02 NOTE — Progress Notes (Signed)
   Subjective:    Patient ID: Diana Nelson, female    DOB: [birth year], 37 y.o.   MRN: [id number]  HPI Here for 4 days of sinus pressure, PND, ST, and ear pain. No fever or cough. On Tylenol. She is 5 months pregnant.    Review of Systems  Constitutional: Negative.   HENT: Positive for congestion, ear pain, postnasal drip, sinus pressure and sinus pain. Negative for sore throat.   Eyes: Negative.   Respiratory: Negative.        Objective:   Physical Exam  Constitutional: She appears well-developed and well-nourished.  HENT:  Right Ear: External ear normal.  Left Ear: External ear normal.  Nose: Nose normal.  Mouth/Throat: Oropharynx is clear and moist. No oropharyngeal exudate.  Eyes: Conjunctivae are normal.  Neck: No thyromegaly present.  Pulmonary/Chest: Effort normal and breath sounds normal. No respiratory distress. She has no wheezes. She has no rales.  Lymphadenopathy:    She has no cervical adenopathy.          Assessment & Plan:  Sinusitis, treat with Keflex.  Gershon CraneStephen Aubri Gathright, MD

## 2017-07-15 ENCOUNTER — Other Ambulatory Visit: Payer: Self-pay | Admitting: Family Medicine

## 2017-07-16 NOTE — Telephone Encounter (Signed)
NO she should not take this while pregnant

## 2017-07-16 NOTE — Telephone Encounter (Signed)
Patient is currently pregnant.   Last OV: 07/02/17 Last filled: 06/13/17, #90, 0 RF Sig: Take 1 tablet (10 mg total) by mouth 3 (three) times daily as needed. UDS: last 09/09/16, positive for benzos (she is also prescribed Xanax), negative for amphetamines

## 2017-07-17 NOTE — Telephone Encounter (Signed)
Spoke with pt voiced understanding that due her being pregnant she should not be taking Ritalin per Dr Clent RidgesFry.Pt verbalized understanding.

## 2017-07-17 NOTE — Telephone Encounter (Signed)
Called pt left a message to return my call in the office regarding to her Ritalin refill request.

## 2017-07-18 ENCOUNTER — Ambulatory Visit (HOSPITAL_COMMUNITY)
Admission: RE | Admit: 2017-07-18 | Discharge: 2017-07-18 | Disposition: A | Discharge status: Home / Self Care | Payer: Medicaid Other | Source: Ambulatory Visit | Attending: Obstetrics and Gynecology | Admitting: Obstetrics and Gynecology

## 2017-07-18 ENCOUNTER — Encounter (HOSPITAL_COMMUNITY): Payer: Self-pay

## 2017-07-18 DIAGNOSIS — O99212 Obesity complicating pregnancy, second trimester: Secondary | ICD-10-CM | POA: Insufficient documentation

## 2017-07-18 DIAGNOSIS — Z843 Family history of consanguinity: Secondary | ICD-10-CM | POA: Insufficient documentation

## 2017-07-18 DIAGNOSIS — O09522 Supervision of elderly multigravida, second trimester: Secondary | ICD-10-CM | POA: Diagnosis not present

## 2017-07-18 DIAGNOSIS — O283 Abnormal ultrasonic finding on antenatal screening of mother: Secondary | ICD-10-CM | POA: Insufficient documentation

## 2017-07-18 DIAGNOSIS — O99332 Smoking (tobacco) complicating pregnancy, second trimester: Secondary | ICD-10-CM | POA: Insufficient documentation

## 2017-07-18 DIAGNOSIS — Z3A23 23 weeks gestation of pregnancy: Secondary | ICD-10-CM | POA: Insufficient documentation

## 2017-07-18 DIAGNOSIS — O10012 Pre-existing essential hypertension complicating pregnancy, second trimester: Secondary | ICD-10-CM | POA: Insufficient documentation

## 2017-07-18 DIAGNOSIS — O09529 Supervision of elderly multigravida, unspecified trimester: Secondary | ICD-10-CM

## 2017-07-30 ENCOUNTER — Ambulatory Visit: Payer: Medicaid Other | Admitting: Family Medicine

## 2017-07-30 DIAGNOSIS — Z0289 Encounter for other administrative examinations: Secondary | ICD-10-CM

## 2017-08-04 ENCOUNTER — Encounter: Payer: Self-pay | Admitting: Family Medicine

## 2017-08-04 ENCOUNTER — Ambulatory Visit: Payer: Medicaid Other | Admitting: Family Medicine

## 2017-08-04 VITALS — BP 128/68 | HR 88 | Temp 98.3°F | Wt 206.0 lb

## 2017-08-04 DIAGNOSIS — J209 Acute bronchitis, unspecified: Secondary | ICD-10-CM

## 2017-08-04 MED ORDER — AMOXICILLIN-POT CLAVULANATE 875-125 MG PO TABS: 1.0000 | ORAL_TABLET | Freq: Two times a day (BID) | ORAL | 0 refills | Status: DC

## 2017-08-04 NOTE — Progress Notes (Signed)
   Subjective:    Patient ID: Diana Nelson, female    DOB: [birth year], 37 y.o.   MRN: [id number]  HPI Here for 10 days of chest congestion and a dry cough. Using her inhaler several times a day. No fever. She is pregnant.    Review of Systems  Constitutional: Negative.   HENT: Negative.   Eyes: Negative.   Respiratory: Positive for cough and chest tightness. Negative for shortness of breath and wheezing.   Cardiovascular: Negative.        Objective:   Physical Exam  Constitutional: She appears well-developed and well-nourished.  HENT:  Right Ear: External ear normal.  Left Ear: External ear normal.  Nose: Nose normal.  Mouth/Throat: Oropharynx is clear and moist.  Eyes: Conjunctivae are normal.  Neck: No thyromegaly present.  Pulmonary/Chest: Effort normal. She has no wheezes. She has no rales.  Scattered rhonchi   Lymphadenopathy:    She has no cervical adenopathy.          Assessment & Plan:  Bronchitis, treat with Augmentin. Use Delsym prn.  Gershon CraneStephen Fry, MD

## 2017-08-07 ENCOUNTER — Encounter (HOSPITAL_COMMUNITY): Payer: Self-pay

## 2017-08-07 ENCOUNTER — Other Ambulatory Visit: Payer: Self-pay

## 2017-08-12 ENCOUNTER — Telehealth: Payer: Self-pay | Admitting: *Deleted

## 2017-08-12 NOTE — Telephone Encounter (Signed)
Fax received from [facility] requesting a prior auth on Omeprazole-Bicarb and [employer] form completed and faxed to [phone].

## 2017-08-22 ENCOUNTER — Telehealth: Payer: Self-pay

## 2017-08-22 MED ORDER — OMEPRAZOLE 40 MG PO CPDR: 40.0000 mg | DELAYED_RELEASE_CAPSULE | Freq: Two times a day (BID) | ORAL | 3 refills | Status: DC

## 2017-08-22 NOTE — Telephone Encounter (Signed)
Fax from [employer]   * Notice of decision on initial request for medicaid service**    STOP Omeprazole-bicarb and call in omeprazole 40 mg BID dISP 180 with 3 rf  Called back number [phone]  Rx has been sent

## 2017-08-28 ENCOUNTER — Other Ambulatory Visit (HOSPITAL_COMMUNITY): Payer: Self-pay | Admitting: Obstetrics and Gynecology

## 2017-08-28 DIAGNOSIS — I10 Essential (primary) hypertension: Secondary | ICD-10-CM

## 2017-09-02 ENCOUNTER — Other Ambulatory Visit: Payer: Self-pay | Admitting: Family Medicine

## 2017-09-09 ENCOUNTER — Other Ambulatory Visit (HOSPITAL_COMMUNITY): Payer: Self-pay | Admitting: Obstetrics and Gynecology

## 2017-09-09 ENCOUNTER — Encounter (HOSPITAL_COMMUNITY): Payer: Self-pay

## 2017-09-09 ENCOUNTER — Ambulatory Visit (HOSPITAL_COMMUNITY)
Admission: RE | Admit: 2017-09-09 | Discharge: 2017-09-09 | Disposition: A | Discharge status: Home / Self Care | Payer: Medicaid Other | Source: Ambulatory Visit | Attending: Obstetrics and Gynecology | Admitting: Obstetrics and Gynecology

## 2017-09-09 DIAGNOSIS — O99213 Obesity complicating pregnancy, third trimester: Secondary | ICD-10-CM | POA: Diagnosis not present

## 2017-09-09 DIAGNOSIS — O283 Abnormal ultrasonic finding on antenatal screening of mother: Secondary | ICD-10-CM

## 2017-09-09 DIAGNOSIS — Z3A31 31 weeks gestation of pregnancy: Secondary | ICD-10-CM | POA: Insufficient documentation

## 2017-09-09 DIAGNOSIS — Z362 Encounter for other antenatal screening follow-up: Secondary | ICD-10-CM | POA: Insufficient documentation

## 2017-09-09 DIAGNOSIS — O10013 Pre-existing essential hypertension complicating pregnancy, third trimester: Secondary | ICD-10-CM | POA: Diagnosis not present

## 2017-09-09 DIAGNOSIS — I10 Essential (primary) hypertension: Secondary | ICD-10-CM

## 2017-09-09 DIAGNOSIS — O10019 Pre-existing essential hypertension complicating pregnancy, unspecified trimester: Secondary | ICD-10-CM

## 2017-09-11 ENCOUNTER — Encounter: Payer: Self-pay | Admitting: Family Medicine

## 2017-09-12 ENCOUNTER — Other Ambulatory Visit (HOSPITAL_COMMUNITY): Payer: Self-pay | Admitting: Obstetrics & Gynecology

## 2017-09-12 DIAGNOSIS — Z3482 Encounter for supervision of other normal pregnancy, second trimester: Secondary | ICD-10-CM

## 2017-09-12 DIAGNOSIS — Z3A33 33 weeks gestation of pregnancy: Secondary | ICD-10-CM

## 2017-09-12 NOTE — Telephone Encounter (Signed)
Please advise if follow-up office is needed for medication refills.

## 2017-09-15 NOTE — Telephone Encounter (Signed)
Have her make an OV for this  

## 2017-09-17 NOTE — Telephone Encounter (Signed)
Please make her an OV for this

## 2017-09-18 ENCOUNTER — Encounter: Payer: Self-pay | Admitting: Family Medicine

## 2017-09-18 ENCOUNTER — Ambulatory Visit (INDEPENDENT_AMBULATORY_CARE_PROVIDER_SITE_OTHER): Payer: Medicaid Other | Admitting: Family Medicine

## 2017-09-18 VITALS — BP 110/64 | HR 93 | Temp 98.5°F | Ht 62.0 in | Wt 208.6 lb

## 2017-09-18 DIAGNOSIS — F900 Attention-deficit hyperactivity disorder, predominantly inattentive type: Secondary | ICD-10-CM

## 2017-09-18 MED ORDER — METHYLPHENIDATE HCL 20 MG PO TABS: 20.0000 mg | ORAL_TABLET | Freq: Three times a day (TID) | ORAL | 0 refills | Status: DC

## 2017-09-18 NOTE — Progress Notes (Signed)
   Subjective:    Patient ID: Diana Nelson, female    DOB: 1980/07/06, 37 y.o.   MRN: [id number]  HPI Here to discuss treatment for ADHD. She had been taking Ritalin 10 mg TID for several years but she stopped it when she got pregnant. She sees Dr. Hoover Browns for OBGYN care. She is now at 32 weeks of gestation and the pregnancy os going well. However she has been struggling with her focus and her attention span. She is working and finds it almost impossible to get anything accomplished. She has discussed this with Dr. Sallye Ober, and Dr. Sallye Ober has told her that it would be safe for her to get back on Ritalin as long as she was closely monitored. She even asks to increase the dose if possible.    Review of Systems  Constitutional: Negative.   Respiratory: Negative.   Cardiovascular: Negative.   Neurological: Negative.   Psychiatric/Behavioral: Positive for decreased concentration. Negative for dysphoric mood. The patient is not nervous/anxious.        Objective:   Physical Exam  Constitutional: She is oriented to person, place, and time. She appears well-developed and well-nourished.  Cardiovascular: Normal rate, regular rhythm, normal heart sounds and intact distal pulses.  Pulmonary/Chest: Effort normal and breath sounds normal. No stridor. No respiratory distress. She has no wheezes. She has no rales.  Neurological: She is alert and oriented to person, place, and time.          Assessment & Plan:  We will treat her ADHD with Ritalin and increase the dose to 20 mg TID. She will return in one month to follow up.  Gershon Crane, MD

## 2017-09-22 ENCOUNTER — Ambulatory Visit: Payer: Medicaid Other | Admitting: Family Medicine

## 2017-09-22 ENCOUNTER — Ambulatory Visit (HOSPITAL_COMMUNITY)
Admission: RE | Admit: 2017-09-22 | Discharge: 2017-09-22 | Disposition: A | Discharge status: Home / Self Care | Payer: Medicaid Other | Source: Ambulatory Visit | Attending: Obstetrics & Gynecology | Admitting: Obstetrics & Gynecology

## 2017-09-22 DIAGNOSIS — O283 Abnormal ultrasonic finding on antenatal screening of mother: Secondary | ICD-10-CM | POA: Insufficient documentation

## 2017-09-22 DIAGNOSIS — O09523 Supervision of elderly multigravida, third trimester: Secondary | ICD-10-CM | POA: Insufficient documentation

## 2017-09-22 DIAGNOSIS — O10013 Pre-existing essential hypertension complicating pregnancy, third trimester: Secondary | ICD-10-CM | POA: Insufficient documentation

## 2017-09-22 DIAGNOSIS — Z3482 Encounter for supervision of other normal pregnancy, second trimester: Secondary | ICD-10-CM

## 2017-09-22 DIAGNOSIS — O99213 Obesity complicating pregnancy, third trimester: Secondary | ICD-10-CM | POA: Diagnosis not present

## 2017-09-22 DIAGNOSIS — Z3A33 33 weeks gestation of pregnancy: Secondary | ICD-10-CM | POA: Diagnosis not present

## 2017-09-22 DIAGNOSIS — O99333 Smoking (tobacco) complicating pregnancy, third trimester: Secondary | ICD-10-CM | POA: Diagnosis not present

## 2017-09-22 DIAGNOSIS — Z843 Family history of consanguinity: Secondary | ICD-10-CM | POA: Diagnosis not present

## 2017-09-30 ENCOUNTER — Other Ambulatory Visit: Payer: Self-pay | Admitting: Family Medicine

## 2017-10-12 ENCOUNTER — Other Ambulatory Visit: Payer: Self-pay | Admitting: Family Medicine

## 2017-10-13 ENCOUNTER — Encounter: Payer: Self-pay | Admitting: Family Medicine

## 2017-10-14 NOTE — Telephone Encounter (Signed)
Call in #90 with 5 rf 

## 2017-10-14 NOTE — Telephone Encounter (Signed)
Last OV 09/18/2017   Last refilled 05/01/2017 disp 90 with 5 refills    Sent to PCP to advise

## 2017-10-15 ENCOUNTER — Encounter: Payer: Self-pay | Admitting: Family Medicine

## 2017-10-15 NOTE — Telephone Encounter (Signed)
On 09-18-17 I sent in refills for 3 months of the Ritalin. The pharmacy has the 2 extra refills on file in their computer, so Lakiyah can simply call them to fill the second rx a little closer to 10-19-17.

## 2017-10-16 ENCOUNTER — Telehealth: Payer: Self-pay | Admitting: Family Medicine

## 2017-10-16 ENCOUNTER — Encounter (HOSPITAL_COMMUNITY): Payer: Medicaid Other

## 2017-10-16 NOTE — Telephone Encounter (Signed)
Copied from CRM [id number]. Topic: Quick Communication - Rx Refill/Question >> Oct 16, 2017 12:56 PM Leafy Ro wrote: Medication: pharm did not received June rx for generic ritalin . Pharm did received may and july Has the patient contacted their pharmacy?  Yes (Agent: If yes, when and what did the pharmacy advise?)  Preferred Pharmacy (with phone number [facility] street [facility]):[facility] [address] Agent: Please be advised that RX refills may take up to 3 business days. We ask that you follow-up with your pharmacy.

## 2017-10-17 MED ORDER — METHYLPHENIDATE HCL 20 MG PO TABS: 20.0000 mg | ORAL_TABLET | Freq: Three times a day (TID) | ORAL | 0 refills | Status: DC

## 2017-10-17 NOTE — Telephone Encounter (Signed)
The rx for June 2 was sent in

## 2017-10-17 NOTE — Telephone Encounter (Signed)
MyChart message regarding same issue sent to Dr. Clent RidgesFry today. Will close phone note.

## 2017-10-20 ENCOUNTER — Telehealth (HOSPITAL_COMMUNITY): Payer: Self-pay | Admitting: *Deleted

## 2017-10-20 ENCOUNTER — Encounter (HOSPITAL_COMMUNITY): Payer: Self-pay | Admitting: *Deleted

## 2017-10-20 NOTE — Telephone Encounter (Signed)
Preadmission screen  

## 2017-10-29 ENCOUNTER — Other Ambulatory Visit: Payer: Self-pay | Admitting: Obstetrics & Gynecology

## 2017-10-31 MED ORDER — METHYLDOPA 250 MG PO TABS
250.0000 mg | ORAL_TABLET | Freq: Two times a day (BID) | ORAL | Status: DC
  Administered 2017-11-01 – 2017-11-03 (×5): 250 mg via ORAL
  Filled 2017-10-31 (×8): qty 1

## 2017-10-31 NOTE — H&P (Addendum)
H&P IOL Note  Diana Nelson is a 37 y.o. female, G5P3013, IUP at 38.6 weeks, presenting for IOL due to [facility] controlled with meds, AMA, smoker, and obesity. Pt endorse + Fm. Denies vaginal leakage. Denies vaginal bleeding. Denies feeling cxt's.   Patient Active Problem List   Diagnosis Date Noted  . [redacted] weeks gestation of pregnancy   . Abnormal findings on antenatal screening   . SVD (spontaneous vaginal delivery) 09/10/2016  . Chronic hypertension affecting pregnancy 09/09/2016  . Advanced maternal age in multigravida, first trimester 09/07/2016  . Current every day smoker 09/07/2016  . Obesity (BMI 30-39.9) 09/07/2016  . Consanguinity 09/07/2016  . Eczema 09/07/2016  . Depression with anxiety 12/29/2015  . Attention deficit hyperactivity disorder (ADHD) 12/29/2015  . Low back pain 02/06/2015  . HTN (hypertension) 02/06/2015  . GERD 01/12/2009  . 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 TENDINITIS 04/29/2008  . Asthma 07/31/2007  . URTICARIA NOS 03/12/2007  . 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 Migraine headache 09/24/2006    Prenatal Problem: chronic back pain Fusion 2006 Group B Streptococcus carrier (Rx in labor) resistant to clinda, susceptible to [place], pt allergic to penicillin.  hypertensive disorder (Metyldopa 250 mg BID Growth monthly and weekly BPP at 32 weeks. Delivery at 39 weeks) Growth monthly and weekly BPP at 32 weeks. Delivery at 39 weeks attention deficit hyperactivity disorder (Ritalin restarted 09/11/17) obesity (BMI 34 at NOB, early Hgb A1C) advanced maternal age gravida (Panorama, with gender.  INCREASED RISK FOR TRISOMY, low volume, 1:17 risk for Trisomy 97, 13, aneuploidy)  PT SEEN BY MFM AND HAD NORMAL NUCHAL TRANSLUCENCY AND ANALYTES STUDY  ANATOMY WITH MFM agoraphobia Xanax use smoker Recommended cessation consanguinity Married to first cousin  Prenatal meds: alprazolam cholecalciferol (vitamin D3) Claritin clindamycin phosphate Colace Delsym  Flonase hydrocortisone-pramoxine iron methyldopa methylphenidate HCl omeprazole ondansetron HCl Prenatal Gummy Proventil HFA Tylenol Zantac   Allergies azithromycin  Lamictal    Medications Prior to Admission  Medication Sig Dispense Refill Last Dose  . albuterol (PROVENTIL HFA) 108 (90 Base) MCG/ACT inhaler INHALE 2 PUFFS INTO THE LUNGS EVERY 4 (FOUR) HOURS AS NEEDED FOR WHEEZING OR SHORTNESS OF BREATH. 6.7 Inhaler 0   . ALPRAZolam (XANAX) 0.5 MG tablet TAKE 1 TABLET EVERY 8 HOURS AS NEEDED FOR ANXIETY 90 tablet 5   . cholecalciferol (VITAMIN D) 1000 units tablet Take 1,000 Units by mouth daily.   Taking  . fluticasone (FLONASE) 50 MCG/ACT nasal spray Place 2 sprays into both nostrils daily. 16 g 6 Taking  . ibuprofen (ADVIL,MOTRIN) 600 MG tablet Take 1 tablet (600 mg total) by mouth every 6 (six) hours as needed. 30 tablet 0 Taking  . Iron Combinations (CHROMAGEN) capsule Take 1 capsule by mouth daily.   Taking  . loratadine (CLARITIN) 10 MG tablet Take 10 mg by mouth at bedtime as needed for allergies.    Taking  . methyldopa (ALDOMET) 250 MG tablet Take 1 tablet (250 mg total) by mouth 2 (two) times daily. 180 tablet 3 Taking  . methylphenidate (RITALIN) 20 MG tablet Take 1 tablet (20 mg total) by mouth 3 (three) times daily. 90 tablet 0   . omeprazole (PRILOSEC) 40 MG capsule Take 1 capsule (40 mg total) by mouth 2 (two) times daily. 30 capsule 3 Taking  . Prenatal Vit w/Fe-Methylfol-FA (PNV PO) Take by mouth.   Taking  . raNITIdine HCl (ZANTAC PO) Take by mouth.   Taking    Past Medical History:  Diagnosis Date  . ADHD   .  Anxiety    on xanax  . Asthma    uses once or twice a day   . 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 Eczema   . GERD (gastroesophageal reflux disease)   . Hypertension   . Migraines      No current facility-administered medications on file prior to encounter.    Current Outpatient Medications on File Prior to Encounter  Medication Sig Dispense Refill  .  albuterol (PROVENTIL HFA) 108 (90 Base) MCG/ACT inhaler INHALE 2 PUFFS INTO THE LUNGS EVERY 4 (FOUR) HOURS AS NEEDED FOR WHEEZING OR SHORTNESS OF BREATH. 6.7 Inhaler 0  . ALPRAZolam (XANAX) 0.5 MG tablet TAKE 1 TABLET EVERY 8 HOURS AS NEEDED FOR ANXIETY 90 tablet 5  . cholecalciferol (VITAMIN D) 1000 units tablet Take 1,000 Units by mouth daily.    . fluticasone (FLONASE) 50 MCG/ACT nasal spray Place 2 sprays into both nostrils daily. 16 g 6  . ibuprofen (ADVIL,MOTRIN) 600 MG tablet Take 1 tablet (600 mg total) by mouth every 6 (six) hours as needed. 30 tablet 0  . Iron Combinations (CHROMAGEN) capsule Take 1 capsule by mouth daily.    Marland Kitchen loratadine (CLARITIN) 10 MG tablet Take 10 mg by mouth at bedtime as needed for allergies.     . methyldopa (ALDOMET) 250 MG tablet Take 1 tablet (250 mg total) by mouth 2 (two) times daily. 180 tablet 3  . methylphenidate (RITALIN) 20 MG tablet Take 1 tablet (20 mg total) by mouth 3 (three) times daily. 90 tablet 0  . omeprazole (PRILOSEC) 40 MG capsule Take 1 capsule (40 mg total) by mouth 2 (two) times daily. 30 capsule 3  . Prenatal Vit w/Fe-Methylfol-FA (PNV PO) Take by mouth.    . raNITIdine HCl (ZANTAC PO) Take by mouth.       Allergies  Allergen Reactions  . Azithromycin     REACTION: nausea  . Lamictal [Lamotrigine] Rash    History of present pregnancy: Pt Info/Preference:  Screening/Consents:  Labs:   EDD: Estimated Date of Delivery: 11/08/17  Establised: Patient's last menstrual period was 02/07/2017 (exact date).  Anatomy Scan: Date: 06/17/2017 Placenta Location: anterior Genetic Screen: Panoroma:Neg Panorama, with gender.  INCREASED RISK FOR TRISOMY, low volume, 1:17 risk for Trisomy 18, 13, aneuploidy, Cleared WNL by MFM  Office: [facility]            First PNV: 8 weeks Blood Type O/Positive/-- (11/26 0000)  Language: [place] Last PNV: 38.4 weeks Rhogam    Flu Vaccine:  Given   Antibody Negative (11/26 0000)  TDaP vaccine Given   GTT: Early: 5.7  HA1C Third Trimester: Negative  Feeding Plan: Breast/bottle BTL: N/A Rubella: Immune (11/26 0000)  Contraception: undecided VBAC: No RPR: Nonreactive (11/26 0000)   Circumcision: BB circ out-pt   HBsAg: Negative (11/26 0000)  Pediatrician:  Dr. Christean Leaf peds    HIV: Non-reactive (11/26 0000)   Prenatal Classes: none Additional [place]: See below GBS:   Positive, resistent to clinda,       Chlamydia: Negative    MFM Referral/Consult: See below GC: Negative  Support Person: Husband   PAP: 2015-normal  Pain Management: Fentanyl, zofran Neonatologist Referral: No Hgb Electrophoresis:  N/A  Birth Plan: No   Hgb NOB: 11.6    28W: 10.7  10/10/2017 Last growth scan:   06/17/2017 MFM consult with [place]:   OB History    Gravida  5   Para  3   Term  3   Preterm  AB  1   Living  3     SAB  1   TAB      Ectopic      Multiple  0   Live Births  1          Past Medical History:  Diagnosis Date  . ADHD   . Anxiety    on xanax  . Asthma    uses once or twice a day   . 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 Eczema   . GERD (gastroesophageal reflux disease)   . Hypertension   . Migraines    Past Surgical History:  Procedure Laterality Date  . knee surgeries     x3  . SPINAL FUSION     2006   . TONSILLECTOMY     Family History: family history includes Birth defects in her daughter. Social History:  reports that she has been smoking cigarettes.  She has been smoking about 1.00 pack per day. She has never used smokeless tobacco. She reports that she does not drink alcohol or use drugs.   Prenatal Transfer Tool  Maternal Diabetes: No Genetic Screening: Normal Maternal Ultrasounds/Referrals: Normal Fetal Ultrasounds or other Referrals:  Referred to Materal Fetal Medicine  Married to first cousin, had anatomy scan with MFM, normal.  Maternal Substance Abuse:  Yes:  Type: Smoker, percocet sparingly.  Significant Maternal Medications:  Meds include: Other: xanax,  adderrall, methyldopa   Significant Maternal Lab Results: Lab values include: Group B Strep positive  ROS:  Review of Systems  All other systems reviewed and are negative.    Physical Exam: BP 114/68, HR 85, R 18, T 98.4 LMP 02/07/2017 (Exact Date)   Physical Exam  Constitutional: She is oriented to person, place, and time and well-developed, well-nourished, and in no distress.  HENT:  Head: Normocephalic and atraumatic.  Eyes: Pupils are equal, round, and reactive to light. Conjunctivae are normal.  Neck: Normal range of motion. Neck supple.  Cardiovascular: Normal rate and regular rhythm.  Pulmonary/Chest: Effort normal and breath sounds normal.  Abdominal: Soft. Bowel sounds are normal.  Genitourinary:  Genitourinary Comments: SVE: 1/30/-3, Pelvic adequate, uterus gravida equal to dates, soft non-tender.   Musculoskeletal: Normal range of motion.  Neurological: She is alert and oriented to person, place, and time.  Skin: Skin is warm and dry.  Psychiatric: Affect normal.  Nursing note and vitals reviewed.    NST: FHR baseline 145 bpm, Variability: moderate, Accelerations:present, Decelerations:  Absent= Cat 1/Reactive UC:   none SVE:    , 1/30/-3  Leopold's: Position vertex, EFW 7 via leopold's.  Bishop score: 1  Labs: No results found for this or any previous visit (from the past 24 hour(s)).  Imaging:  No results found.  Last BPP: 10/29/2017 8/8 with AFI 8cm   Assessment/Plan: ANBERLIN DIEZ is a 37 y.o. female, G5P3013, IUP at 38.6 weeks, presenting for IOL due to [facility] controlled with meds, AMA, smoker, and obesity.   FWB: Cat 1 Fetal Tracing.   Plan: Admit to Birthing Suite per consult with MD Dion Body Routine [facility] IOL orders (Plan to start with Cytotec, then once ripened move to pitocin).  Pain med needs fentanyl with zofran: pt has metal screws and rod in spine.  CHTN: continue aldomet 250mg  PO BID, if BP increases will check [facility] labs PCN G for GBS  prophylaxis (pt endorses there is not allergy to penicillin, just GI upset when taken PO).  Anticipate labor progression  Will  update Dr Dion BodyVarnado as needed.    Benaiah Behan NP-C, CNM, MSN 11/01/2017, 1:37 AM

## 2017-11-01 ENCOUNTER — Inpatient Hospital Stay (HOSPITAL_COMMUNITY)
Admission: RE | Admit: 2017-11-01 | Discharge: 2017-11-03 | DRG: 806 | Disposition: A | Discharge status: Home / Self Care | Payer: Medicaid Other | Attending: Obstetrics & Gynecology | Admitting: Obstetrics & Gynecology

## 2017-11-01 ENCOUNTER — Encounter (HOSPITAL_COMMUNITY): Payer: Self-pay

## 2017-11-01 DIAGNOSIS — B084 Enteroviral vesicular stomatitis with exanthem: Secondary | ICD-10-CM | POA: Diagnosis present

## 2017-11-01 DIAGNOSIS — F418 Other specified anxiety disorders: Secondary | ICD-10-CM | POA: Diagnosis present

## 2017-11-01 DIAGNOSIS — O1002 Pre-existing essential hypertension complicating childbirth: Principal | ICD-10-CM | POA: Diagnosis present

## 2017-11-01 DIAGNOSIS — F909 Attention-deficit hyperactivity disorder, unspecified type: Secondary | ICD-10-CM | POA: Diagnosis present

## 2017-11-01 DIAGNOSIS — O9962 Diseases of the digestive system complicating childbirth: Secondary | ICD-10-CM | POA: Diagnosis present

## 2017-11-01 DIAGNOSIS — O10913 Unspecified pre-existing hypertension complicating pregnancy, third trimester: Secondary | ICD-10-CM | POA: Diagnosis present

## 2017-11-01 DIAGNOSIS — F17210 Nicotine dependence, cigarettes, uncomplicated: Secondary | ICD-10-CM | POA: Diagnosis present

## 2017-11-01 DIAGNOSIS — E669 Obesity, unspecified: Secondary | ICD-10-CM | POA: Diagnosis present

## 2017-11-01 DIAGNOSIS — O99344 Other mental disorders complicating childbirth: Secondary | ICD-10-CM | POA: Diagnosis present

## 2017-11-01 DIAGNOSIS — Z3A38 38 weeks gestation of pregnancy: Secondary | ICD-10-CM | POA: Diagnosis not present

## 2017-11-01 DIAGNOSIS — K219 Gastro-esophageal reflux disease without esophagitis: Secondary | ICD-10-CM | POA: Diagnosis present

## 2017-11-01 DIAGNOSIS — Z880 Allergy status to penicillin: Secondary | ICD-10-CM

## 2017-11-01 DIAGNOSIS — O9852 Other viral diseases complicating childbirth: Secondary | ICD-10-CM | POA: Diagnosis present

## 2017-11-01 DIAGNOSIS — O99824 Streptococcus B carrier state complicating childbirth: Secondary | ICD-10-CM | POA: Diagnosis present

## 2017-11-01 DIAGNOSIS — O99334 Smoking (tobacco) complicating childbirth: Secondary | ICD-10-CM | POA: Diagnosis present

## 2017-11-01 DIAGNOSIS — O99214 Obesity complicating childbirth: Secondary | ICD-10-CM | POA: Diagnosis present

## 2017-11-01 HISTORY — DX: Other specified postprocedural states: R11.2

## 2017-11-01 HISTORY — DX: Anemia, unspecified: D64.9

## 2017-11-01 HISTORY — DX: Other specified postprocedural states: Z98.890

## 2017-11-01 HISTORY — DX: Nausea with vomiting, unspecified: R11.2

## 2017-11-01 LAB — COMPREHENSIVE METABOLIC PANEL
ALT: 17 U/L (ref 14–54)
AST: 23 U/L (ref 15–41)
Albumin: 2.9 g/dL — ABNORMAL LOW (ref 3.5–5.0)
Alkaline Phosphatase: 116 U/L (ref 38–126)
Anion gap: 11 (ref 5–15)
BILIRUBIN TOTAL: 0.2 mg/dL — AB (ref 0.3–1.2)
BUN: 17 mg/dL (ref 6–20)
CHLORIDE: 104 mmol/L (ref 101–111)
CO2: 19 mmol/L — ABNORMAL LOW (ref 22–32)
Calcium: 8.9 mg/dL (ref 8.9–10.3)
Creatinine, Ser: 0.75 mg/dL (ref 0.44–1.00)
GFR calc Af Amer: >60 mL/min (ref >60)
Glucose, Bld: 85 mg/dL (ref 65–99)
POTASSIUM: 4.3 mmol/L (ref 3.5–5.1)
Sodium: 134 mmol/L — ABNORMAL LOW (ref 135–145)
Total Protein: 6.4 g/dL — ABNORMAL LOW (ref 6.5–8.1)

## 2017-11-01 LAB — CBC
HCT: 35.7 % — ABNORMAL LOW (ref 36.0–46.0)
Hemoglobin: 11.9 g/dL — ABNORMAL LOW (ref 12.0–15.0)
MCH: 30.3 pg (ref 26.0–34.0)
MCHC: 33.3 g/dL (ref 30.0–36.0)
MCV: 90.8 fL (ref 78.0–100.0)
PLATELETS: 140 10*3/uL — AB (ref 150–400)
RBC: 3.93 MIL/uL (ref 3.87–5.11)
RDW: 13.9 % (ref 11.5–15.5)
WBC: 14 10*3/uL — ABNORMAL HIGH (ref 4.0–10.5)

## 2017-11-01 LAB — RPR: RPR: NONREACTIVE

## 2017-11-01 LAB — TYPE AND SCREEN
ABO/RH(D): O POS
Antibody Screen: NEGATIVE

## 2017-11-01 MED ORDER — SIMETHICONE 80 MG PO CHEW: 80.0000 mg | CHEWABLE_TABLET | ORAL | Status: DC | PRN

## 2017-11-01 MED ORDER — OXYTOCIN 40 UNITS IN LACTATED RINGERS INFUSION - SIMPLE MED
1.0000 m[IU]/min | INTRAVENOUS | Status: DC
  Administered 2017-11-01: 1 m[IU]/min via INTRAVENOUS
  Filled 2017-11-01: qty 1000

## 2017-11-01 MED ORDER — ONDANSETRON HCL 4 MG PO TABS
4.0000 mg | ORAL_TABLET | ORAL | Status: DC | PRN
  Administered 2017-11-02 – 2017-11-03 (×4): 4 mg via ORAL
  Filled 2017-11-01 (×4): qty 1

## 2017-11-01 MED ORDER — DIPHENHYDRAMINE HCL 25 MG PO CAPS: 25.0000 mg | ORAL_CAPSULE | Freq: Four times a day (QID) | ORAL | Status: DC | PRN

## 2017-11-01 MED ORDER — LIDOCAINE HCL (PF) 1 % IJ SOLN
30.0000 mL | INTRAMUSCULAR | Status: DC | PRN
  Filled 2017-11-01: qty 30

## 2017-11-01 MED ORDER — ZOLPIDEM TARTRATE 5 MG PO TABS: 5.0000 mg | ORAL_TABLET | Freq: Every evening | ORAL | Status: DC | PRN

## 2017-11-01 MED ORDER — LACTATED RINGERS IV SOLN: 500.0000 mL | INTRAVENOUS | Status: DC | PRN

## 2017-11-01 MED ORDER — BENZOCAINE-MENTHOL 20-0.5 % EX AERO: 1.0000 "application " | INHALATION_SPRAY | CUTANEOUS | Status: DC | PRN

## 2017-11-01 MED ORDER — PENICILLIN G POT IN DEXTROSE 60000 UNIT/ML IV SOLN
3.0000 10*6.[IU] | INTRAVENOUS | Status: DC
  Administered 2017-11-01: 3 10*6.[IU] via INTRAVENOUS
  Filled 2017-11-01 (×6): qty 50

## 2017-11-01 MED ORDER — SODIUM CHLORIDE 0.9 % IV SOLN
5.0000 10*6.[IU] | Freq: Once | INTRAVENOUS | Status: AC
  Administered 2017-11-01: 5 10*6.[IU] via INTRAVENOUS
  Filled 2017-11-01: qty 5

## 2017-11-01 MED ORDER — PENICILLIN G POT IN DEXTROSE 60000 UNIT/ML IV SOLN: 3.0000 10*6.[IU] | INTRAVENOUS | Status: DC

## 2017-11-01 MED ORDER — SOD CITRATE-CITRIC ACID 500-334 MG/5ML PO SOLN: 30.0000 mL | ORAL | Status: DC | PRN

## 2017-11-01 MED ORDER — OXYCODONE-ACETAMINOPHEN 5-325 MG PO TABS
2.0000 | ORAL_TABLET | Freq: Once | ORAL | Status: AC
  Administered 2017-11-01: 2 via ORAL
  Filled 2017-11-01: qty 2

## 2017-11-01 MED ORDER — COCONUT OIL OIL: 1.0000 "application " | TOPICAL_OIL | Status: DC | PRN

## 2017-11-01 MED ORDER — METHYLPHENIDATE HCL 20 MG PO TABS
20.0000 mg | ORAL_TABLET | Freq: Three times a day (TID) | ORAL | Status: DC
  Filled 2017-11-01 (×9): qty 1

## 2017-11-01 MED ORDER — SENNOSIDES-DOCUSATE SODIUM 8.6-50 MG PO TABS
2.0000 | ORAL_TABLET | ORAL | Status: DC
  Administered 2017-11-01 – 2017-11-03 (×2): 2 via ORAL
  Filled 2017-11-01 (×2): qty 2

## 2017-11-01 MED ORDER — ALPRAZOLAM 0.5 MG PO TABS
0.5000 mg | ORAL_TABLET | Freq: Three times a day (TID) | ORAL | Status: DC | PRN
  Administered 2017-11-01 – 2017-11-03 (×5): 0.5 mg via ORAL
  Filled 2017-11-01 (×5): qty 1

## 2017-11-01 MED ORDER — ACETAMINOPHEN 325 MG PO TABS
650.0000 mg | ORAL_TABLET | ORAL | Status: DC | PRN
  Administered 2017-11-02: 650 mg via ORAL

## 2017-11-01 MED ORDER — ACETAMINOPHEN 325 MG PO TABS
650.0000 mg | ORAL_TABLET | ORAL | Status: DC | PRN
  Filled 2017-11-01: qty 2

## 2017-11-01 MED ORDER — TERBUTALINE SULFATE 1 MG/ML IJ SOLN
0.2500 mg | Freq: Once | INTRAMUSCULAR | Status: DC | PRN
  Filled 2017-11-01: qty 1

## 2017-11-01 MED ORDER — OXYTOCIN BOLUS FROM INFUSION: 500.0000 mL | Freq: Once | INTRAVENOUS | Status: DC

## 2017-11-01 MED ORDER — MISOPROSTOL 25 MCG QUARTER TABLET
25.0000 ug | ORAL_TABLET | ORAL | Status: DC | PRN
  Administered 2017-11-01 (×2): 25 ug via VAGINAL
  Filled 2017-11-01 (×3): qty 1

## 2017-11-01 MED ORDER — TETANUS-DIPHTH-ACELL PERTUSSIS 5-2.5-18.5 LF-MCG/0.5 IM SUSP: 0.5000 mL | Freq: Once | INTRAMUSCULAR | Status: DC

## 2017-11-01 MED ORDER — WITCH HAZEL-GLYCERIN EX PADS: 1.0000 "application " | MEDICATED_PAD | CUTANEOUS | Status: DC | PRN

## 2017-11-01 MED ORDER — PRENATAL MULTIVITAMIN CH
1.0000 | ORAL_TABLET | Freq: Every day | ORAL | Status: DC
  Administered 2017-11-02 – 2017-11-03 (×2): 1 via ORAL
  Filled 2017-11-01 (×2): qty 1

## 2017-11-01 MED ORDER — OXYTOCIN 10 UNIT/ML IJ SOLN
INTRAMUSCULAR | Status: AC
  Filled 2017-11-01: qty 1

## 2017-11-01 MED ORDER — DIBUCAINE 1 % RE OINT
1.0000 | TOPICAL_OINTMENT | RECTAL | Status: DC | PRN
Start: 2017-11-01 — End: 2017-11-04

## 2017-11-01 MED ORDER — ONDANSETRON HCL 4 MG/2ML IJ SOLN: 4.0000 mg | INTRAMUSCULAR | Status: DC | PRN

## 2017-11-01 MED ORDER — ONDANSETRON HCL 4 MG/2ML IJ SOLN
4.0000 mg | Freq: Four times a day (QID) | INTRAMUSCULAR | Status: DC | PRN
  Administered 2017-11-01: 4 mg via INTRAVENOUS
  Filled 2017-11-01: qty 2

## 2017-11-01 MED ORDER — LACTATED RINGERS IV SOLN
INTRAVENOUS | Status: DC
  Administered 2017-11-01: 02:00:00 via INTRAVENOUS

## 2017-11-01 MED ORDER — FLEET ENEMA 7-19 GM/118ML RE ENEM: 1.0000 | ENEMA | RECTAL | Status: DC | PRN

## 2017-11-01 MED ORDER — IBUPROFEN 600 MG PO TABS
600.0000 mg | ORAL_TABLET | Freq: Four times a day (QID) | ORAL | Status: DC
  Administered 2017-11-01 – 2017-11-03 (×8): 600 mg via ORAL
  Filled 2017-11-01 (×10): qty 1

## 2017-11-01 MED ORDER — FENTANYL CITRATE (PF) 100 MCG/2ML IJ SOLN
50.0000 ug | INTRAMUSCULAR | Status: DC | PRN
  Administered 2017-11-01: 50 ug via INTRAVENOUS
  Filled 2017-11-01: qty 2

## 2017-11-01 MED ORDER — SODIUM CHLORIDE 0.9 % IV SOLN: 5.0000 10*6.[IU] | Freq: Once | INTRAVENOUS | Status: DC

## 2017-11-01 MED ORDER — OXYTOCIN 40 UNITS IN LACTATED RINGERS INFUSION - SIMPLE MED: 2.5000 [IU]/h | INTRAVENOUS | Status: DC

## 2017-11-01 NOTE — Progress Notes (Signed)
Pt leaving unit to smoke. FOB in room with infant skin-to-skin. Mother to call back when she returns

## 2017-11-01 NOTE — Anesthesia Pain Management Evaluation Note (Signed)
  [facility] Pain Management Visit Note  Patient: Diana Nelson, 37 y.o., female  "Hello I am a member of the anesthesia team at [facility]. We have an anesthesia team available at all times to provide care throughout the hospital, including epidural management and anesthesia for C-section. I don't know your plan for the delivery whether it a natural birth, water birth, IV sedation, nitrous supplementation, doula or epidural, but we want to meet your pain goals."   1.Was your pain managed to your expectations on prior hospitalizations?   Yes   2.What is your expectation for pain management during this hospitalization?     IV pain meds  3.How can we help you reach that goal? unsure  Record the patient's initial score and the patient's pain goal.   Pain: 5  Pain Goal: 10 [facility] wants you to be able to say your pain was always managed very well.  Diana Nelson,Diana Nelson 11/01/2017

## 2017-11-01 NOTE — Progress Notes (Signed)
Spoke with Diana Nelson with infection prevention. Use contact precaution PPE related to HFM Disease.

## 2017-11-01 NOTE — Progress Notes (Addendum)
Diana Nelson is a 37 y.o. [id number] at 117w0d admitted for induction of labor due to Hypertension.  Subjective: Patient is eating breakfast in bed. Patient was being tested for hand foot and mouth disease after son was diagnosed with it in office on 10/29/17 but labs have not resulted. Presumptive diagnosis can be made based on presence of papules on hands bilaterally.   Objective: Vitals:   11/01/17 0601 11/01/17 0611 11/01/17 0701 11/01/17 0800  BP: 110/82  (!) 89/57 94/64  Pulse: 94  77 84  Resp: 18  18   Temp:  98.2 F (36.8 C)    TempSrc:  Oral    SpO2: 97%  97% 97%  Weight:      Height:       FHT:  FHR: 145 bpm, variability: moderate,  accelerations:  Present,  decelerations:  Absent UC:   none SVE:   Dilation: 1.5 Effacement (%): 50 Station: -3 Exam by:: Carney LivingHanna Beitz RN   Cervix is very soft   Labs: Lab Results  Component Value Date   WBC 14.0 (H) 11/01/2017   HGB 11.9 (L) 11/01/2017   HCT 35.7 (L) 11/01/2017   MCV 90.8 11/01/2017   PLT 140 (L) 11/01/2017    Assessment / Plan: Induction of labor, progressing on cytotec Will start Pitocin at 1015 Diagnosis of hand foot and mouth disease Consulting Pediatrician regarding possible transmission to newborn  Place on contact precautions   Labor: Progressing normally Preeclampsia:  no signs or symptoms of toxicity, intake and ouput balanced and labs stable Fetal Wellbeing:  Category I Pain Control:  IV pain meds I/D:  n/a Anticipated MOD:  NSVD  Diana Nelson 11/01/2017, 10:31 AM   Spoke with Dr. Earlene Plateravis, pediatric MD from [facility], as we will be newborn's pediatrician. He was aware of patient's possible exposure as he diagnosed older child. Per Dr. Earlene Plateravis, patient should not be contagious tomorrow if continues to be afebrile. He indicated that it is okay for patient to hold and interact with her newborn but stressed hand hygiene. I relayed this information to the patient, provided her with a HFM handout, and  stressed hand hygiene and avoiding transmission of saliva from her to the newborn to her as well. Will discontinue contact precautions tomorrow morning provided patient does not become febrile.   Diana Nelson 11:37 AM 11/01/17

## 2017-11-01 NOTE — Progress Notes (Signed)
MOB asked how soon she can go outside and smoke. MOB was encouraged regarding smoking cessation Educated MOB and FOB not to smoke around the infant or in the home, and the need to change clothes and wash hands before holding infant after smoking. Both were also reminded that [facility] is a tobacco free health system. MOB verbalized understanding, FOB nodded.

## 2017-11-01 NOTE — Progress Notes (Signed)
Leonette NuttingHolly K D'Amato is a 37 y.o. [id number] at 1935w0d admitted for induction of labor due to Hypertension.  Subjective: Patient is asleep in bed. Spoke to RN, will hold HTN medication if systolic BP <110   Objective: Vitals:   11/01/17 0601 11/01/17 0611 11/01/17 0701 11/01/17 0800  BP: 110/82  (!) 89/57 94/64  Pulse: 94  77 84  Resp: 18  18   Temp:  98.2 F (36.8 C)    TempSrc:  Oral    SpO2: 97%  97% 97%  Weight:      Height:       FHT:  FHR: 135 bpm, variability: moderate,  accelerations:  Present,  decelerations:  Absent UC:   none SVE:   Dilation: 1.5 Effacement (%): 50 Station: -3 Exam by:: Carney LivingHanna Beitz RN   Labs: Lab Results  Component Value Date   WBC 14.0 (H) 11/01/2017   HGB 11.9 (L) 11/01/2017   HCT 35.7 (L) 11/01/2017   MCV 90.8 11/01/2017   PLT 140 (L) 11/01/2017    Assessment / Plan: Induction of labor, progressing on cytotec  Labor: Progressing normally Preeclampsia:  no signs or symptoms of toxicity, intake and ouput balanced and labs stable Fetal Wellbeing:  Category I Pain Control:  Patient with history of spinal fusion, afraid of epidural and desires IV pain meds instead I/D:  n/a Anticipated MOD:  NSVD  Janeece Riggersllis K Greer 11/01/2017, 9:29 AM

## 2017-11-02 LAB — CBC
HEMATOCRIT: 31.3 % — AB (ref 36.0–46.0)
Hemoglobin: 10.3 g/dL — ABNORMAL LOW (ref 12.0–15.0)
MCH: 30.3 pg (ref 26.0–34.0)
MCHC: 32.9 g/dL (ref 30.0–36.0)
MCV: 92.1 fL (ref 78.0–100.0)
Platelets: 150 10*3/uL (ref 150–400)
RBC: 3.4 MIL/uL — AB (ref 3.87–5.11)
RDW: 14.3 % (ref 11.5–15.5)
WBC: 13.1 10*3/uL — AB (ref 4.0–10.5)

## 2017-11-02 MED ORDER — OXYCODONE HCL 5 MG PO TABS
5.0000 mg | ORAL_TABLET | Freq: Four times a day (QID) | ORAL | Status: DC | PRN
  Administered 2017-11-02 – 2017-11-03 (×5): 5 mg via ORAL
  Filled 2017-11-02 (×5): qty 1

## 2017-11-02 NOTE — Lactation Note (Signed)
This note was copied from a baby's chart. Lactation Consultation Note  Patient Name: Diana Nelson MoodHolly D'Amato EXBMW'UToday's Date: 11/02/2017 Reason for consult: Follow-up assessment;NICU baby [name] has been transferred to NICU for elevated bilirubin.  Mom would like to provide breastmilk for her baby.  Symphony pump set up and initiated.  Instructed to pump and hand express every 3 hours x 15 minutes.  Mom had WIC but unsure if she is still active.  [facility] breast [facility].  Encouraged to call out with concerns prn.  Maternal Data    Feeding    LATCH Score                   Interventions    Lactation Tools Discussed/Used Pump Review: Setup, frequency, and cleaning;Milk Storage Initiated by:: LM Date initiated:: 11/02/17   Consult Status Consult Status: Follow-up Date: 11/03/17 Follow-up type: In-patient    Huston FoleyMOULDEN, Idelle Reimann S 11/02/2017, 12:28 PM

## 2017-11-02 NOTE — Progress Notes (Addendum)
Parents educated about wearing gloves when outside of room and touching other surfaces.  Also reinforced the need to wash hands thoroughly and frequently.  They agree and understand.  Mother states that pediatrician told her she did not need to wear gloves while handling/carrying baby.  She is refraining from kissing/snuggling with baby and washing her hands before/after touching baby.    Baby in crib, mother states she slept for a good while and will now try to feed baby.

## 2017-11-02 NOTE — Progress Notes (Addendum)
Subjective: Postpartum Day # 1 : S/P NSVD due to IOL due to [facility] controlled with meds, AMA, smoker, and obesity. Patient up ad lib, denies syncope or dizziness. Reports consuming regular diet without issues and denies N/V. Patient reports 0 bowel movement + passing flatus.  Denies issues with urination and reports bleeding is "medium."  Patient is Breastfeeding with supplementation of bottle due to infant in NICU under billi lights and reports going well.  Desires undecided for postpartum contraception.  Pain is being appropriately managed with use of motrin tylenol, but endorses increased back pain and needs breakthrough pain meds. Pt taking smoke breaks reported by RN denies wanting nicotine patch. Pt endorse she used THC X3 weekly all during pregnancy.    1st degree repaired laceration Feeding:  Breast/bottle Contraceptive plan:  undecided BB: Circ out-pt  Objective: Vital signs in last 24 hours: Patient Vitals for the past 24 hrs:  BP Temp Temp src Pulse Resp SpO2  11/02/17 0631 114/79 98 F (36.7 C) Oral 83 16 100 %  11/01/17 2200 114/75 98.1 F (36.7 C) Oral 75 18 99 %  11/01/17 1724 124/82 98.4 F (36.9 C) - 88 18 -  11/01/17 1625 118/77 98.5 F (36.9 C) - 71 18 -  11/01/17 1517 123/78 - - 83 - -  11/01/17 1502 132/80 - - 73 - -  11/01/17 1432 (!) 133/54 - - 82 - -  11/01/17 1422 (!) 173/50 - - 81 - -     Physical Exam:  General: alert, cooperative, appears stated age and no distress Mood/Affect: Flat Lungs: clear to auscultation, no wheezes, rales or rhonchi, symmetric air entry.  Heart: normal rate, regular rhythm, normal S1, S2, no murmurs, rubs, clicks or gallops. Breast: breasts appear normal, no suspicious masses, no skin or nipple changes or axillary nodes. Abdomen:  + bowel sounds, soft, non-tender GU: perineum 1 degree laceration, healing well. No signs of external hematomas.  Uterine Fundus: firm Lochia: appropriate Skin: Warm, Dry. DVT Evaluation: No evidence of  DVT seen on physical exam. Negative Homan's sign. No cords or calf tenderness. No significant calf/ankle edema.  CBC Latest Ref Rng & Units 11/02/2017 11/01/2017 09/11/2016  WBC 4.0 - 10.5 K/uL 13.1(H) 14.0(H) 20.5(H)  Hemoglobin 12.0 - 15.0 g/dL 10.3(L) 11.9(L) 9.4(L)  Hematocrit 36.0 - 46.0 % 31.3(L) 35.7(L) 28.3(L)  Platelets 150 - 400 K/uL 150 140(L) 311    Results for orders placed or performed during the hospital encounter of 11/01/17 (from the past 24 hour(s))  CBC     Status: Abnormal   Collection Time: 11/02/17  6:14 AM  Result Value Ref Range   WBC 13.1 (H) 4.0 - 10.5 K/uL   RBC 3.40 (L) 3.87 - 5.11 MIL/uL   Hemoglobin 10.3 (L) 12.0 - 15.0 g/dL   HCT 91.4 (L) 78.2 - 95.6 %   MCV 92.1 78.0 - 100.0 fL   MCH 30.3 26.0 - 34.0 pg   MCHC 32.9 30.0 - 36.0 g/dL   RDW 21.3 08.6 - 57.8 %   Platelets 150 150 - 400 K/uL     CBG (last 3)  No results for input(s): GLUCAP in the last 72 hours.   I/O last 3 completed shifts: In: -  Out: 400 [Blood:400]   Assessment Postpartum Day # 1 : S/P NSVD due to [facility] controlled with meds, AMA, smoker, and obesity. Pt stable. -2 involution. Breast/Bottlefeeding. Hemodynamically stable. Chronic back pain uncontrolled with motrin and tylenol from back fusion with rod placement.  Plan: Continue other mgmt as ordered VTE prophylactics: Early ambulated as tolerates.  Pain control: Motrin/Tylenol PRN, IR roxy post motrin and tylenol for breakthrough pain.  CHTN: Continue aldomet 250mg  BID PO.  Hand foot Mouth: pt was cleared by infectious control, d/c contact precautions.  Pt to f/u with pain management for chronic back pain if needed.  Education given regarding options for contraception, including undecided.  Plan for discharge tomorrow, Breastfeeding, Lactation consult and Social Work consult Dr. Dion BodyVarnado to be updated on patient status  Avynn Klassen NP-C, CNM 11/02/2017, 1:55 PM

## 2017-11-03 ENCOUNTER — Other Ambulatory Visit: Payer: Self-pay

## 2017-11-03 ENCOUNTER — Encounter (HOSPITAL_COMMUNITY): Payer: Self-pay

## 2017-11-03 MED ORDER — IBUPROFEN 600 MG PO TABS: 600.0000 mg | ORAL_TABLET | Freq: Four times a day (QID) | ORAL | 0 refills | Status: DC | PRN

## 2017-11-03 NOTE — Discharge Instructions (Signed)
Contraception Choices °Contraception, also called birth control, means things to use or ways to try not to get pregnant. °Hormonal birth control °This kind of birth control uses hormones. Here are some types of hormonal birth control: °· A tube that is put under skin of the arm (implant). The tube can stay in for as long as 3 years. °· Shots to get every 3 months (injections). °· Pills to take every day (birth control pills). °· A patch to change 1 time each week for 3 weeks (birth control patch). After that, the patch is taken off for 1 week. °· A ring to put in the vagina. The ring is left in for 3 weeks. Then it is taken out of the vagina for 1 week. Then a new ring is put in. °· Pills to take after unprotected sex (emergency birth control pills). ° °Barrier birth control °Here are some types of barrier birth control: °· A thin covering that is put on the penis before sex (female condom). The covering is thrown away after sex. °· A soft, loose covering that is put in the vagina before sex (female condom). The covering is thrown away after sex. °· A rubber bowl that sits over the cervix (diaphragm). The bowl must be made for you. The bowl is put into the vagina before sex. The bowl is left in for 6-8 hours after sex. It is taken out within 24 hours. °· A small, soft cup that fits over the cervix (cervical cap). The cup must be made for you. The cup can be left in for 6-8 hours after sex. It is taken out within 48 hours. °· A sponge that is put into the vagina before sex. It must be left in for at least 6 hours after sex. It must be taken out within 30 hours. Then it is thrown away. °· A chemical that kills or stops sperm from getting into the uterus (spermicide). It may be a pill, cream, jelly, or foam to put in the vagina. The chemical should be used at least 10-15 minutes before sex. ° °IUD (intrauterine) birth control °An IUD is a small, T-shaped piece of plastic. It is put inside the uterus. There are two  kinds: °· Hormone IUD. This kind can stay in for 3-5 years. °· Copper IUD. This kind can stay in for 10 years. ° °Permanent birth control °Here are some types of permanent birth control: °· Surgery to block the fallopian tubes. °· Having an insert put into each fallopian tube. °· Surgery to tie off the tubes that carry sperm (vasectomy). ° °Natural planning birth control °Here are some types of natural planning birth control: °· Not having sex on the days the woman could get pregnant. °· Using a calendar: °? To keep track of the length of each period. °? To find out what days pregnancy can happen. °? To plan to not have sex on days when pregnancy can happen. °· Watching for symptoms of ovulation and not having sex during ovulation. One way the woman can check for ovulation is to check her temperature. °· Waiting to have sex until after ovulation. ° °Summary °· Contraception, also called birth control, means things to use or ways to try not to get pregnant. °· Hormonal methods of birth control include implants, injections, pills, patches, vaginal rings, and emergency birth control pills. °· Barrier methods of birth control can include female condoms, female condoms, diaphragms, cervical caps, sponges, and spermicides. °· There are two types of   IUD (intrauterine device) birth control. An IUD can be put in a woman's uterus to prevent pregnancy for 3-5 years. °· Permanent sterilization can be done through a procedure for males, females, or both. °· Natural planning methods involve not having sex on the days when the woman could get pregnant. °This information is not intended to replace advice given to you by your health care provider. Make sure you discuss any questions you have with your health care provider. °Document Released: 03/03/2009 Document Revised: 05/16/2016 Document Reviewed: 05/16/2016 °Elsevier Interactive Patient Education © 2017 [employer] ° ° °Postpartum Care After Vaginal Delivery ° °The period of  time right after you deliver your newborn is called the postpartum period. °What kind of medical care will I receive? °· You may continue to receive fluids and medicines through an IV tube inserted into one of your veins. °· If an incision was made near your vagina (episiotomy) or if you had some vaginal tearing during delivery, cold compresses may be placed on your episiotomy or your tear. This helps to reduce pain and swelling. °· You may be given a squirt bottle to use when you go to the bathroom. You may use this until you are comfortable wiping as usual. To use the squirt bottle, follow these steps: °? Before you urinate, fill the squirt bottle with warm water. Do not use hot water. °? After you urinate, while you are sitting on the toilet, use the squirt bottle to rinse the area around your urethra and vaginal opening. This rinses away any urine and blood. °? You may do this instead of wiping. As you start healing, you may use the squirt bottle before wiping yourself. Make sure to wipe gently. °? Fill the squirt bottle with clean water every time you use the bathroom. °· You will be given sanitary pads to wear. °How can I expect to feel? °· You may not feel the need to urinate for several hours after delivery. °· You will have some soreness and pain in your abdomen and vagina. °· If you are breastfeeding, you may have uterine contractions every time you breastfeed for up to several weeks postpartum. Uterine contractions help your uterus return to its normal size. °· It is normal to have vaginal bleeding (lochia) after delivery. The amount and appearance of lochia is often similar to a menstrual period in the first week after delivery. It will gradually decrease over the next few weeks to a dry, yellow-brown discharge. For most women, lochia stops completely by 6-8 weeks after delivery. Vaginal bleeding can vary from woman to woman. °· Within the first few days after delivery, you may have breast engorgement.  This is when your breasts feel heavy, full, and uncomfortable. Your breasts may also throb and feel hard, tightly stretched, warm, and tender. After this occurs, you may have milk leaking from your breasts. Your health care provider can help you relieve discomfort due to breast engorgement. Breast engorgement should go away within a few days. °· You may feel more sad or worried than normal due to hormonal changes after delivery. These feelings should not last more than a few days. If these feelings do not go away after several days, speak with your health care provider. °How should I care for myself? °· Tell your health care provider if you have pain or discomfort. °· Drink enough water to keep your urine clear or pale yellow. °· Wash your hands thoroughly with soap and water for at least 20 seconds after   changing your sanitary pads, after using the toilet, and before holding or feeding your baby. °· If you are not breastfeeding, avoid touching your breasts a lot. Doing this can make your breasts produce more milk. °· If you become weak or lightheaded, or you feel like you might faint, ask for help before: °? Getting out of bed. °? Showering. °· Change your sanitary pads frequently. Watch for any changes in your flow, such as a sudden increase in volume, a change in color, the passing of large blood clots. If you pass a blood clot from your vagina, save it to show to your health care provider. Do not flush blood clots down the toilet without having your health care provider look at them. °· Make sure that all your vaccinations are up to date. This can help protect you and your baby from getting certain diseases. You may need to have immunizations done before you leave the hospital. °· If desired, talk with your health care provider about methods of family planning or birth control (contraception). °How can I start bonding with my baby? °Spending as much time as possible with your baby is very important. During this  time, you and your baby can get to know each other and develop a bond. Having your baby stay with you in your room (rooming in) can give you time to get to know your baby. Rooming in can also help you become comfortable caring for your baby. Breastfeeding can also help you bond with your baby. °How can I plan for returning home with my baby? °· Make sure that you have a car seat installed in your vehicle. °? Your car seat should be checked by a certified car seat installer to make sure that it is installed safely. °? Make sure that your baby fits into the car seat safely. °· Ask your health care provider any questions you have about caring for yourself or your baby. Make sure that you are able to contact your health care provider with any questions after leaving the hospital. °This information is not intended to replace advice given to you by your health care provider. Make sure you discuss any questions you have with your health care provider. °Document Released: 03/03/2007 Document Revised: 10/09/2015 Document Reviewed: 04/10/2015 °Elsevier Interactive Patient Education © 2018 [employer] ° ° °Postpartum Depression and Baby Blues °The postpartum period begins right after the birth of a baby. During this time, there is often a great amount of joy and excitement. It is also a time of many changes in the life of the parents. Regardless of how many times a mother gives birth, each child brings new challenges and dynamics to the family. It is not unusual to have feelings of excitement along with confusing shifts in moods, emotions, and thoughts. All mothers are at risk of developing postpartum depression or the "baby blues." These mood changes can occur right after giving birth, or they may occur many months after giving birth. The baby blues or postpartum depression can be mild or severe. Additionally, postpartum depression can go away rather quickly, or it can be a long-term condition. °What are the causes? °Raised  hormone levels and the rapid drop in those levels are thought to be a main cause of postpartum depression and the baby blues. A number of hormones change during and after pregnancy. Estrogen and progesterone usually decrease right after the delivery of your baby. The levels of thyroid hormone and various cortisol steroids also rapidly drop. Other factors   that play a role in these mood changes include major life events and genetics. °What increases the risk? °If you have any of the following risks for the baby blues or postpartum depression, know what symptoms to watch out for during the postpartum period. Risk factors that may increase the likelihood of getting the baby blues or postpartum depression include: °· Having a personal or family history of depression. °· Having depression while being pregnant. °· Having premenstrual mood issues or mood issues related to oral contraceptives. °· Having a lot of life stress. °· Having marital conflict. °· Lacking a social support network. °· Having a baby with special needs. °· Having health problems, such as diabetes. ° °What are the signs or symptoms? °Symptoms of baby blues include: °· Brief changes in mood, such as going from extreme happiness to sadness. °· Decreased concentration. °· Difficulty sleeping. °· Crying spells, tearfulness. °· Irritability. °· Anxiety. ° °Symptoms of postpartum depression typically begin within the first month after giving birth. These symptoms include: °· Difficulty sleeping or excessive sleepiness. °· Marked weight loss. °· Agitation. °· Feelings of worthlessness. °· Lack of interest in activity or food. ° °Postpartum psychosis is a very serious condition and can be dangerous. Fortunately, it is rare. Displaying any of the following symptoms is cause for immediate medical attention. Symptoms of postpartum psychosis include: °· Hallucinations and delusions. °· Bizarre or disorganized behavior. °· Confusion or disorientation. ° °How is this  diagnosed? °A diagnosis is made by an evaluation of your symptoms. There are no medical or lab tests that lead to a diagnosis, but there are various questionnaires that a health care provider may use to identify those with the baby blues, postpartum depression, or psychosis. Often, a screening tool called the Edinburgh Postnatal Depression Scale is used to diagnose depression in the postpartum period. °How is this treated? °The baby blues usually goes away on its own in 1-2 weeks. Social support is often all that is needed. You will be encouraged to get adequate sleep and rest. Occasionally, you may be given medicines to help you sleep. °Postpartum depression requires treatment because it can last several months or longer if it is not treated. Treatment may include individual or group therapy, medicine, or both to address any social, physiological, and psychological factors that may play a role in the depression. Regular exercise, a healthy diet, rest, and social support may also be strongly recommended. °Postpartum psychosis is more serious and needs treatment right away. Hospitalization is often needed. °Follow these instructions at home: °· Get as much rest as you can. Nap when the baby sleeps. °· Exercise regularly. Some women find yoga and walking to be beneficial. °· Eat a balanced and nourishing diet. °· Do little things that you enjoy. Have a cup of tea, take a bubble bath, read your favorite magazine, or listen to your favorite music. °· Avoid alcohol. °· Ask for help with household chores, cooking, grocery shopping, or running errands as needed. Do not try to do everything. °· Talk to people close to you about how you are feeling. Get support from your partner, family members, friends, or other new moms. °· Try to stay positive in how you think. Think about the things you are grateful for. °· Do not spend a lot of time alone. °· Only take over-the-counter or prescription medicine as directed by your health  care provider. °· Keep all your postpartum appointments. °· Let your health care provider know if you have any concerns. °Contact   a health care provider if: °You are having a reaction to or problems with your medicine. °Get help right away if: °· You have suicidal feelings. °· You think you may harm the baby or someone else. °This information is not intended to replace advice given to you by your health care provider. Make sure you discuss any questions you have with your health care provider. °Document Released: 02/08/2004 Document Revised: 10/12/2015 Document Reviewed: 02/15/2013 °Elsevier Interactive Patient Education © 2017 Elsevier Inc. ° ° °

## 2017-11-03 NOTE — Discharge Summary (Signed)
OB Discharge Summary     Patient Name: Diana Nelson DOB: April 18, 1981 MRN: [id number]  Date of admission: 11/01/2017 Delivering MD: Janeece Riggers   Date of discharge: 11/03/2017  Admitting diagnosis: 39WKS INDUCTION Intrauterine pregnancy: [redacted]w[redacted]d     Secondary diagnosis:  Active Problems:   Maternal chronic hypertension in third trimester   Hand, foot and mouth disease     Discharge diagnosis: Term Pregnancy Delivered                                                                                                Post partum procedures:n/a  Augmentation: AROM, Pitocin and Cytotec  Complications: None  Hospital course:  Induction of Labor With Vaginal Delivery   37 y.o. yo [id number] at [redacted]w[redacted]d was admitted to the hospital 11/01/2017 for induction of labor.  Indication for induction: chronic hypertension.  Patient had an uncomplicated labor course as follows: Membrane Rupture Time/Date: 2:03 PM ,11/01/2017   Intrapartum Procedures: Episiotomy: None [1]                                         Lacerations:  1st degree [2]  Patient had delivery of a Viable infant.  Information for the patient's newborn:  Eliannah, Hinde [[id number]]  Delivery Method: Vaginal, Spontaneous(Filed from Delivery Summary)   11/01/2017  Details of delivery can be found in separate delivery note.  Patient had a routine postpartum course. Patient is discharged home 11/03/17.  Physical exam  Vitals:   11/01/17 1724 11/01/17 2200 11/02/17 0631 11/03/17 0018  BP: 124/82 114/75 114/79 116/72  Pulse: 88 75 83 88  Resp: 18 18 16 18   Temp: 98.4 F (36.9 C) 98.1 F (36.7 C) 98 F (36.7 C) 98 F (36.7 C)  TempSrc:  Oral Oral Oral  SpO2:  99% 100% 99%  Weight:      Height:       General: alert, cooperative and no distress Lochia: appropriate Uterine Fundus: firm Incision: N/A DVT Evaluation: No evidence of DVT seen on physical exam. Negative Homan's sign. No cords or calf tenderness. No significant  calf/ankle edema. Labs: Lab Results  Component Value Date   WBC 13.1 (H) 11/02/2017   HGB 10.3 (L) 11/02/2017   HCT 31.3 (L) 11/02/2017   MCV 92.1 11/02/2017   PLT 150 11/02/2017   CMP Latest Ref Rng & Units 11/01/2017  Glucose 65 - 99 mg/dL 85  BUN 6 - 20 mg/dL 17  Creatinine 6.96 - 2.95 mg/dL 2.84  Sodium 132 - 440 mmol/L 134(L)  Potassium 3.5 - 5.1 mmol/L 4.3  Chloride 101 - 111 mmol/L 104  CO2 22 - 32 mmol/L 19(L)  Calcium 8.9 - 10.3 mg/dL 8.9  Total Protein 6.5 - 8.1 g/dL 6.4(L)  Total Bilirubin 0.3 - 1.2 mg/dL 1.0(U)  Alkaline Phos 38 - 126 U/L 116  AST 15 - 41 U/L 23  ALT 14 - 54 U/L 17    Discharge instruction: per After Visit Summary and "Baby and Me  Booklet".  After visit meds:  Allergies as of 11/03/2017      Reactions   Azithromycin    REACTION: nausea   Lamictal [lamotrigine] Rash      Medication List    STOP taking these medications   ZANTAC PO     TAKE these medications   albuterol 108 (90 Base) MCG/ACT inhaler Commonly known as:  PROVENTIL HFA INHALE 2 PUFFS INTO THE LUNGS EVERY 4 (FOUR) HOURS AS NEEDED FOR WHEEZING OR SHORTNESS OF BREATH.   ALPRAZolam 0.5 MG tablet Commonly known as:  XANAX TAKE 1 TABLET EVERY 8 HOURS AS NEEDED FOR ANXIETY   cholecalciferol 1000 units tablet Commonly known as:  VITAMIN D Take 1,000 Units by mouth daily.   chromagen capsule Take 1 capsule by mouth daily.   CLARITIN 10 MG tablet Generic drug:  loratadine Take 10 mg by mouth at bedtime as needed for allergies.   clindamycin 1 % Swab Commonly known as:  CLEOCIN T clindamycin phosphate 1 % topical swab  APPLY A THIN FILM TO AFFECTED AREA TWICE A DAY   fluticasone 50 MCG/ACT nasal spray Commonly known as:  FLONASE Place 2 sprays into both nostrils daily.   hydrocortisone-pramoxine 2.5-1 % rectal cream Commonly known as:  ANALPRAM-HC hydrocortisone-pramoxine 2.5 %-1 % rectal cream  APPLY TO AFFECTED AREA TWICE A DAY AS NEEDED   ibuprofen 600 MG  tablet Commonly known as:  ADVIL,MOTRIN Take 1 tablet (600 mg total) by mouth every 6 (six) hours as needed for moderate pain or cramping.   methyldopa 250 MG tablet Commonly known as:  ALDOMET Take 1 tablet (250 mg total) by mouth 2 (two) times daily.   methylphenidate 20 MG tablet Commonly known as:  RITALIN Take 1 tablet (20 mg total) by mouth 3 (three) times daily.   omeprazole 40 MG capsule Commonly known as:  PRILOSEC Take 1 capsule (40 mg total) by mouth 2 (two) times daily.   ondansetron 8 MG tablet Commonly known as:  ZOFRAN ondansetron HCl 8 mg tablet  TAKE 1 TABLET BY MOUTH EVERY 8 HOURS AS NEEDED   PRENATAL GUMMIES/DHA & FA PO Take 1 tablet by mouth daily.       Diet: routine diet  Activity: Advance as tolerated. Pelvic rest for 6 weeks.   Outpatient follow up:6 weeks Follow up Appt:No future appointments. Follow up Visit:No follow-ups on file.  Postpartum contraception: Undecided  Newborn Data: Live born female  Birth Weight: 6 lb 5.6 oz (2880 g) APGAR: 9, 9  Newborn Delivery   Birth date/time:  11/01/2017 14:13:00 Delivery type:  Vaginal, Spontaneous     Baby Feeding: Breast Disposition:home with mother   11/03/2017 Janeece RiggersEllis K Yacqub Baston, CNM

## 2017-12-02 ENCOUNTER — Telehealth: Payer: Self-pay

## 2017-12-02 DIAGNOSIS — J4540 Moderate persistent asthma, uncomplicated: Secondary | ICD-10-CM

## 2017-12-02 MED ORDER — OPTICHAMBER DIAMOND MISC: 1 refills | Status: AC

## 2017-12-02 NOTE — Telephone Encounter (Signed)
Fax from [facility] @ [address]   Requesting for a spacer for pt's HFA   Sent to PCP for approval

## 2017-12-02 NOTE — Telephone Encounter (Signed)
This was sent in  

## 2017-12-08 ENCOUNTER — Other Ambulatory Visit: Payer: Self-pay | Admitting: Family Medicine

## 2017-12-08 NOTE — Telephone Encounter (Signed)
Last OV 09/18/2017   Last refilled 10/19/2017 disp 90 with no refills   Will need to call pt to advise that PCP is out of the office and make sure they are OK to wait until PCP is back in the office?

## 2017-12-08 NOTE — Telephone Encounter (Signed)
Called pt and left a detailed VM that PCP is out of the office until Thursday. Requested for pt to call back if they are unable to wait until Thursday.

## 2017-12-10 NOTE — Telephone Encounter (Signed)
Sent to PCP for approval.  

## 2017-12-12 ENCOUNTER — Other Ambulatory Visit: Payer: Self-pay | Admitting: Family Medicine

## 2017-12-15 MED ORDER — METHYLPHENIDATE HCL 20 MG PO TABS: 20.0000 mg | ORAL_TABLET | Freq: Three times a day (TID) | ORAL | 0 refills | Status: DC

## 2017-12-15 NOTE — Telephone Encounter (Signed)
Last OV: 09/18/17 Last filled:  Sig:

## 2017-12-15 NOTE — Telephone Encounter (Signed)
Done

## 2018-01-01 ENCOUNTER — Ambulatory Visit (INDEPENDENT_AMBULATORY_CARE_PROVIDER_SITE_OTHER): Payer: Medicaid Other | Admitting: Family Medicine

## 2018-01-01 ENCOUNTER — Encounter: Payer: Self-pay | Admitting: Family Medicine

## 2018-01-01 VITALS — BP 118/80 | HR 105 | Temp 98.6°F | Ht 62.0 in | Wt 202.6 lb

## 2018-01-01 DIAGNOSIS — L700 Acne vulgaris: Secondary | ICD-10-CM | POA: Diagnosis not present

## 2018-01-01 DIAGNOSIS — B351 Tinea unguium: Secondary | ICD-10-CM | POA: Insufficient documentation

## 2018-01-01 MED ORDER — TERBINAFINE HCL 250 MG PO TABS: 250.0000 mg | ORAL_TABLET | Freq: Every day | ORAL | 1 refills | Status: DC

## 2018-01-01 MED ORDER — CLINDAMYCIN PHOSPHATE 1 % EX SWAB: 1.0000 "application " | Freq: Two times a day (BID) | CUTANEOUS | 5 refills | Status: DC

## 2018-01-01 NOTE — Progress Notes (Signed)
   Subjective:    Patient ID: Leonette NuttingHolly K D'Amato, female    DOB: 01/19/1981, 37 y.o.   MRN: [id number]  HPI Here for several issues. First she developed some facial acne during her last pregnancy and her OBGYN gave her Clindamycin swabs to use. These work well and she asks for refills. Also for several weeks she has had discoloration in a few fingernails. This seemed to be triggered by a recent bout of hand-foot-mouth disease she got from her children. She is no longer breast feeding.    Review of Systems  Constitutional: Negative.   Respiratory: Negative.   Cardiovascular: Negative.   Skin: Positive for color change and rash.       Objective:   Physical Exam  Constitutional: She appears well-developed and well-nourished.  Cardiovascular: Normal rate, regular rhythm, normal heart sounds and intact distal pulses.  Pulmonary/Chest: Effort normal and breath sounds normal.  Skin:  She has mild papular facial acne. Several fingernails on both hands show fungal involvement.           Assessment & Plan:  Treat the oncychomycosis with Terbinafine for 3-6 months. Refilled the Clindamycin swabs for the acne.  Gershon CraneStephen Sanika Brosious, MD

## 2018-01-03 ENCOUNTER — Other Ambulatory Visit: Payer: Self-pay | Admitting: Family Medicine

## 2018-01-21 ENCOUNTER — Other Ambulatory Visit: Payer: Self-pay | Admitting: Family Medicine

## 2018-03-08 ENCOUNTER — Other Ambulatory Visit: Payer: Self-pay | Admitting: Family Medicine

## 2018-03-09 NOTE — Telephone Encounter (Signed)
Dr. Fry please advise on refill of medication.   

## 2018-03-10 MED ORDER — METHYLPHENIDATE HCL 20 MG PO TABS: 20.0000 mg | ORAL_TABLET | Freq: Three times a day (TID) | ORAL | 0 refills | Status: DC

## 2018-03-10 NOTE — Telephone Encounter (Signed)
Done

## 2018-04-06 ENCOUNTER — Other Ambulatory Visit: Payer: Self-pay | Admitting: Family Medicine

## 2018-04-07 DIAGNOSIS — Z3201 Encounter for pregnancy test, result positive: Secondary | ICD-10-CM | POA: Diagnosis not present

## 2018-04-09 ENCOUNTER — Encounter: Payer: Self-pay | Admitting: Family Medicine

## 2018-04-09 NOTE — Telephone Encounter (Signed)
Dr. Fry please advise. Thanks  

## 2018-04-10 MED ORDER — ALPRAZOLAM 0.5 MG PO TABS: ORAL_TABLET | ORAL | 5 refills | Status: DC

## 2018-04-10 NOTE — Telephone Encounter (Signed)
Call in #() with [phone] rf

## 2018-04-11 ENCOUNTER — Other Ambulatory Visit: Payer: Self-pay | Admitting: Family Medicine

## 2018-04-14 ENCOUNTER — Encounter: Payer: Self-pay | Admitting: Family Medicine

## 2018-04-14 NOTE — Telephone Encounter (Signed)
Dr. Fry please advise. Thanks  

## 2018-04-15 MED ORDER — METHYLDOPA 250 MG PO TABS: 250.0000 mg | ORAL_TABLET | Freq: Two times a day (BID) | ORAL | 3 refills | Status: DC

## 2018-04-15 MED ORDER — ALPRAZOLAM 1 MG PO TABS: 1.0000 mg | ORAL_TABLET | Freq: Three times a day (TID) | ORAL | 5 refills | Status: DC | PRN

## 2018-04-15 NOTE — Telephone Encounter (Signed)
Done

## 2018-04-17 ENCOUNTER — Encounter (HOSPITAL_COMMUNITY): Payer: Self-pay | Admitting: Emergency Medicine

## 2018-04-17 ENCOUNTER — Ambulatory Visit (HOSPITAL_COMMUNITY)
Admission: EM | Admit: 2018-04-17 | Discharge: 2018-04-17 | Disposition: A | Discharge status: Home / Self Care | Payer: Medicaid Other | Attending: Family Medicine | Admitting: Family Medicine

## 2018-04-17 DIAGNOSIS — K8020 Calculus of gallbladder without cholecystitis without obstruction: Secondary | ICD-10-CM | POA: Diagnosis not present

## 2018-04-17 MED ORDER — ONDANSETRON HCL 4 MG PO TABS: 4.0000 mg | ORAL_TABLET | Freq: Four times a day (QID) | ORAL | 0 refills | Status: DC

## 2018-04-17 MED ORDER — HYDROCODONE-ACETAMINOPHEN 5-325 MG PO TABS: 1.0000 | ORAL_TABLET | Freq: Four times a day (QID) | ORAL | 0 refills | Status: DC | PRN

## 2018-04-17 NOTE — ED Triage Notes (Signed)
Here for nausea onset 2 weeks associated w/pain on LU back   Reports she was told she had gallstones during her pregnancy.   Taking Ibuprofen and Goody powder  A&O x4... NAD.Marland Kitchen.. ambulatory

## 2018-04-17 NOTE — ED Provider Notes (Signed)
[facility]    CSN: [id number] Arrival date & time: 04/17/18  0957     History   Chief Complaint Chief Complaint  Patient presents with  . Nausea    HPI Diana Nelson is a 37 y.o. female.   HPI  Patient is here for mild abdominal pain and nausea.  Is been worsening over the last 2 weeks.  Its in the right upper quadrant up to her right back.  Sometimes is worse after meals.  Sometimes it wakes her up in the middle of the night She was told her recent pregnancy that she had multiple gallstones.  They were not symptomatic at the time.  She thinks this was causing her problem at this time.  She is on a regular diet.  She cannot tell the difference with any specific foods, fried or fatty foods.  Her weight is been stable. She is on multiple medications.  None of these are new.  We discussed that sometimes multiple medicines can cause nausea especially if taken on an empty stomach. She states she is certain that she is not pregnant.  She has had regular menstrual periods and has not had sexual relations since the birth of her son in June.   Past Medical History:  Diagnosis Date  . ADHD   . Anemia   . Anxiety    on xanax  . Asthma    uses once or twice a day   . 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 Eczema   . GERD (gastroesophageal reflux disease)   . Hypertension   . Migraines   . PONV (postoperative nausea and vomiting)     Patient Active Problem List   Diagnosis Date Noted  . Acne vulgaris 01/01/2018  . Onychomycosis 01/01/2018  . Maternal chronic hypertension in third trimester 11/01/2017  . Chronic hypertension affecting pregnancy 09/09/2016  . Advanced maternal age in multigravida, first trimester 09/07/2016  . Current every day smoker 09/07/2016  . Obesity (BMI 30-39.9) 09/07/2016  . Consanguinity 09/07/2016  . Eczema 09/07/2016  . Depression with anxiety 12/29/2015  . Attention deficit hyperactivity disorder (ADHD) 12/29/2015  . Low back pain  02/06/2015  . HTN (hypertension) 02/06/2015  . GERD 01/12/2009  . 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 Asthma 07/31/2007  . 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 Migraine headache 09/24/2006    Past Surgical History:  Procedure Laterality Date  . knee surgeries     x3  . SPINAL FUSION     2006   . TONSILLECTOMY      OB History    Gravida  5   Para  3   Term  3   Preterm      AB  1   Living  3     SAB  1   TAB      Ectopic      Multiple  0   Live Births  1            Home Medications    Prior to Admission medications   Medication Sig Start Date End Date Taking? Authorizing Provider  albuterol (PROVENTIL HFA) 108 (90 Base) MCG/ACT inhaler INHALE 2 PUFFS INTO THE LUNGS EVERY 4 (FOUR) HOURS AS NEEDED FOR WHEEZING OR SHORTNESS OF BREATH. 10/01/17  Yes Nelwyn Salisbury, MD  albuterol (PROVENTIL HFA) 108 (90 Base) MCG/ACT inhaler INHALE 2 PUFFS INTO THE LUNGS EVERY 4 (FOUR) HOURS AS NEEDED FOR WHEEZING OR SHORTNESS OF BREATH. 01/05/18  Yes Clent Ridges,  Tera Mater, MD  ALPRAZolam Prudy Feeler) 1 MG tablet Take 1 tablet (1 mg total) by mouth 3 (three) times daily as needed for anxiety. 04/15/18  Yes Nelwyn Salisbury, MD  cholecalciferol (VITAMIN D) 1000 units tablet Take 1,000 Units by mouth daily.   Yes [provider]  fluticasone (FLONASE) 50 MCG/ACT nasal spray Place 2 sprays into both nostrils daily. 02/06/15  Yes Nelwyn Salisbury, MD  hydrocortisone-pramoxine [facility]) 2.5-1 % rectal cream hydrocortisone-pramoxine 2.5 %-1 % rectal cream  APPLY TO AFFECTED AREA TWICE A DAY AS NEEDED   Yes [provider]  ibuprofen (ADVIL,MOTRIN) 600 MG tablet Take 1 tablet (600 mg total) by mouth every 6 (six) hours as needed for moderate pain or cramping. 11/03/17  Yes Janeece Riggers, CNM  Iron Combinations (CHROMAGEN) capsule Take 1 capsule by mouth daily.   Yes [provider]  loratadine (CLARITIN) 10 MG tablet Take 10 mg by mouth at bedtime as needed for  allergies.    Yes [provider]  methyldopa (ALDOMET) 250 MG tablet Take 1 tablet (250 mg total) by mouth 2 (two) times daily. 04/15/18  Yes Nelwyn Salisbury, MD  methylphenidate (RITALIN) 20 MG tablet Take 1 tablet (20 mg total) by mouth 3 (three) times daily. 05/17/18 06/16/18 Yes Nelwyn Salisbury, MD  omeprazole (PRILOSEC) 40 MG capsule TAKE 1 CAPSULE BY MOUTH TWICE A DAY 01/21/18  Yes Nelwyn Salisbury, MD  Prenatal MV-Min-FA-Omega-3 (PRENATAL GUMMIES/DHA & FA PO) Take 1 tablet by mouth daily.   Yes [provider]  Spacer/Aero-Holding Chambers [facility] DIAMOND) MISC Use with inhaler 12/02/17  Yes Nelwyn Salisbury, MD  terbinafine (LAMISIL) 250 MG tablet Take 1 tablet (250 mg total) by mouth daily. 01/01/18  Yes Nelwyn Salisbury, MD  clindamycin (CLEOCIN T) 1 % SWAB Apply 1 application topically 2 (two) times daily. 01/01/18   Nelwyn Salisbury, MD  HYDROcodone-acetaminophen (NORCO/VICODIN) 5-325 MG tablet Take 1-2 tablets by mouth every 6 (six) hours as needed for severe pain. 04/17/18   Eustace Moore, MD  ondansetron (ZOFRAN) 4 MG tablet Take 1 tablet (4 mg total) by mouth every 6 (six) hours. 04/17/18   Eustace Moore, MD  ondansetron (ZOFRAN) 4 MG tablet Take 1-2 tablets (4-8 mg total) by mouth every 6 (six) hours. 04/17/18   Eustace Moore, MD    Family History Family History  Problem Relation Age of Onset  . Birth defects Daughter        pectus excavatum surgery at age 50    Social History Social History   Tobacco Use  . Smoking status: Current Every Day Smoker    Packs/day: 1.00    Types: Cigarettes    Last attempt to quit: 06/16/2012    Years since quitting: 5.8  . Smokeless tobacco: Never Used  Substance Use Topics  . Alcohol use: No    Alcohol/week: 0.0 standard drinks  . Drug use: No     Allergies   Azithromycin and Lamictal [lamotrigine]   Review of Systems Review of Systems  Constitutional: Negative for chills and fever.  HENT: Negative for  ear pain and sore throat.   Eyes: Negative for pain and visual disturbance.  Respiratory: Negative for cough and shortness of breath.   Cardiovascular: Negative for chest pain and palpitations.  Gastrointestinal: Positive for abdominal distention, abdominal pain and nausea. Negative for vomiting.  Genitourinary: Negative for dysuria and hematuria.  Musculoskeletal: Negative for arthralgias and back pain.  Skin: Negative for  color change and rash.  Neurological: Negative for seizures and syncope.  All other systems reviewed and are negative.    Physical Exam Triage Vital Signs ED Triage Vitals  Enc Vitals Group     BP 04/17/18 1103 121/80     Pulse Rate 04/17/18 1103 97     Resp 04/17/18 1103 20     Temp 04/17/18 1103 98 F (36.7 C)     Temp Source 04/17/18 1103 Oral     SpO2 04/17/18 1103 100 %     Weight --      Height --      Head Circumference --      Peak Flow --      Pain Score 04/17/18 1104 6     Pain Loc --      Pain Edu? --      Excl. in GC? --    No data found.  Updated Vital Signs BP 121/80 (BP Location: Left Arm)   Pulse 97   Temp 98 F (36.7 C) (Oral)   Resp 20   SpO2 100%   Visual Acuity Right Eye Distance:   Left Eye Distance:   Bilateral Distance:    Right Eye Near:   Left Eye Near:    Bilateral Near:     Physical Exam  Constitutional: She appears well-developed and well-nourished. No distress.  HENT:  Head: Normocephalic and atraumatic.  Mouth/Throat: Oropharynx is clear and moist.  Eyes: Pupils are equal, round, and reactive to light. Conjunctivae are normal.  Neck: Normal range of motion.  Cardiovascular: Normal rate, regular rhythm and normal heart sounds.  Pulmonary/Chest: Effort normal and breath sounds normal. No respiratory distress.  Smells of tobacco  Abdominal: Soft. She exhibits no distension. There is no tenderness. There is no guarding.  Musculoskeletal: Normal range of motion. She exhibits no edema.  Neurological: She is  alert.  Skin: Skin is warm and dry.  Psychiatric: She has a normal mood and affect. Her behavior is normal.     UC Treatments / Results  Labs (all labs ordered are listed, but only abnormal results are displayed) Labs Reviewed - No data to display  EKG None  Radiology No results found.  Procedures Procedures (including critical care time)  Medications Ordered in UC Medications - No data to display  Initial Impression / Assessment and Plan / UC Course  I have reviewed the triage vital signs and the nursing notes.  Pertinent labs & imaging results that were available during my care of the patient were reviewed by me and considered in my medical decision making (see chart for details).     Patient inquires whether she should go to the emergency room.  I explained to her that she is not having sufficient symptoms to have an acute cholecystectomy.  I told her that this is an elective procedure.  The goal at this time is to change her diet.  Manage nausea and pain.  Is to call her primary care office on Monday to get referral to a surgeon.  Come back to the ER for acute unremitting pain, vomiting, high fever Final Clinical Impressions(s) / UC Diagnoses   Final diagnoses:  Gallstones     Discharge Instructions     Take Zofran 4 mg every 6 hours as needed for nausea.  Avoid illness or acute increase to 2 pills. Call your primary care doctor on Monday to set up a surgical consult   ED Prescriptions    Medication Sig Dispense  Auth. Provider   ondansetron (ZOFRAN) 4 MG tablet Take 1-2 tablets (4-8 mg total) by mouth every 6 (six) hours. 20 tablet Eustace MooreNelson, Tamieka Rancourt Sue, MD   HYDROcodone-acetaminophen (NORCO/VICODIN) 5-325 MG tablet Take 1-2 tablets by mouth every 6 (six) hours as needed for severe pain. 12 tablet Eustace MooreNelson, Sheniece Ruggles Sue, MD     Controlled Substance Prescriptions [place] Controlled Substance Registry consulted? Yes, I have consulted the [place] Controlled Substances Registry for  this patient, and feel the risk/benefit ratio today is favorable for proceeding with this prescription for a controlled substance.  I did write on the prescription that she should not take hydrocodone with her Xanax   Eustace MooreNelson, Jonnatan Hanners Sue, MD 04/17/18 [phone]

## 2018-04-17 NOTE — Discharge Instructions (Signed)
Take Zofran 4 mg every 6 hours as needed for nausea.  Avoid illness or acute increase to 2 pills. Call your primary care doctor on Monday to set up a surgical consult

## 2018-04-22 ENCOUNTER — Other Ambulatory Visit: Payer: Self-pay

## 2018-04-22 ENCOUNTER — Emergency Department (HOSPITAL_COMMUNITY)
Admission: EM | Admit: 2018-04-22 | Discharge: 2018-04-22 | Disposition: A | Discharge status: Home / Self Care | Payer: Medicaid Other | Attending: Emergency Medicine | Admitting: Emergency Medicine

## 2018-04-22 ENCOUNTER — Emergency Department (HOSPITAL_COMMUNITY): Payer: Medicaid Other

## 2018-04-22 ENCOUNTER — Encounter (HOSPITAL_COMMUNITY): Payer: Self-pay | Admitting: *Deleted

## 2018-04-22 DIAGNOSIS — Z79899 Other long term (current) drug therapy: Secondary | ICD-10-CM | POA: Diagnosis not present

## 2018-04-22 DIAGNOSIS — I10 Essential (primary) hypertension: Secondary | ICD-10-CM | POA: Diagnosis not present

## 2018-04-22 DIAGNOSIS — F17210 Nicotine dependence, cigarettes, uncomplicated: Secondary | ICD-10-CM | POA: Insufficient documentation

## 2018-04-22 DIAGNOSIS — J45909 Unspecified asthma, uncomplicated: Secondary | ICD-10-CM | POA: Diagnosis not present

## 2018-04-22 DIAGNOSIS — R1011 Right upper quadrant pain: Secondary | ICD-10-CM | POA: Diagnosis present

## 2018-04-22 DIAGNOSIS — K8020 Calculus of gallbladder without cholecystitis without obstruction: Secondary | ICD-10-CM | POA: Insufficient documentation

## 2018-04-22 LAB — CBC
HCT: 40.9 % (ref 36.0–46.0)
Hemoglobin: 12.9 g/dL (ref 12.0–15.0)
MCH: 29 pg (ref 26.0–34.0)
MCHC: 31.5 g/dL (ref 30.0–36.0)
MCV: 91.9 fL (ref 80.0–100.0)
Platelets: 263 10*3/uL (ref 150–400)
RBC: 4.45 MIL/uL (ref 3.87–5.11)
RDW: 13.7 % (ref 11.5–15.5)
WBC: 10.2 10*3/uL (ref 4.0–10.5)
nRBC: 0 % (ref 0.0–0.2)

## 2018-04-22 LAB — COMPREHENSIVE METABOLIC PANEL
ALT: 19 U/L (ref 0–44)
AST: 19 U/L (ref 15–41)
Albumin: 4.2 g/dL (ref 3.5–5.0)
Alkaline Phosphatase: 91 U/L (ref 38–126)
Anion gap: 11 (ref 5–15)
BUN: 18 mg/dL (ref 6–20)
CO2: 22 mmol/L (ref 22–32)
Calcium: 9.2 mg/dL (ref 8.9–10.3)
Chloride: 106 mmol/L (ref 98–111)
Creatinine, Ser: 0.67 mg/dL (ref 0.44–1.00)
GFR calc Af Amer: >60 mL/min (ref >60)
GFR calc non Af Amer: >60 mL/min (ref >60)
GLUCOSE: 100 mg/dL — AB (ref 70–99)
Potassium: 3.6 mmol/L (ref 3.5–5.1)
Sodium: 139 mmol/L (ref 135–145)
Total Bilirubin: 0.3 mg/dL (ref 0.3–1.2)
Total Protein: 7.4 g/dL (ref 6.5–8.1)

## 2018-04-22 LAB — I-STAT BETA HCG BLOOD, ED (MC, WL, AP ONLY): I-stat hCG, quantitative: <5 m[IU]/mL (ref <5)

## 2018-04-22 LAB — LIPASE, BLOOD: Lipase: 41 U/L (ref 11–51)

## 2018-04-22 MED ORDER — HYDROCODONE-ACETAMINOPHEN 5-325 MG PO TABS: 1.0000 | ORAL_TABLET | ORAL | 0 refills | Status: DC | PRN

## 2018-04-22 MED ORDER — ONDANSETRON 4 MG PO TBDP: 4.0000 mg | ORAL_TABLET | Freq: Three times a day (TID) | ORAL | 0 refills | Status: DC | PRN

## 2018-04-22 MED ORDER — HYDROCODONE-ACETAMINOPHEN 5-325 MG PO TABS: 1.0000 | ORAL_TABLET | Freq: Once | ORAL | Status: DC

## 2018-04-22 MED ORDER — ONDANSETRON 4 MG PO TBDP: 4.0000 mg | ORAL_TABLET | Freq: Once | ORAL | Status: DC

## 2018-04-22 NOTE — ED Provider Notes (Signed)
[facility] EMERGENCY DEPARTMENT Provider Note   CSN: [id number] Arrival date & time: 04/22/18  1340     History   Chief Complaint Chief Complaint  Patient presents with  . Abdominal Pain    HPI Diana Nelson is a 37 y.o. female.  The history is provided by the patient.  Abdominal Pain   This is a new problem. The current episode started more than 1 week ago. The problem occurs every several days. The problem has been gradually worsening. The pain is associated with eating. The pain is located in the RUQ. The pain is moderate. Associated symptoms include nausea. Nothing aggravates the symptoms. Nothing relieves the symptoms. Past workup includes ultrasound. Her past medical history is significant for gallstones.  Pt was diagnosed with gallstones on ultrasound while she was pregnant.   Past Medical History:  Diagnosis Date  . ADHD   . Anemia   . Anxiety    on xanax  . Asthma    uses once or twice a day   . 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 Eczema   . GERD (gastroesophageal reflux disease)   . Hypertension   . Migraines   . PONV (postoperative nausea and vomiting)     Patient Active Problem List   Diagnosis Date Noted  . Acne vulgaris 01/01/2018  . Onychomycosis 01/01/2018  . Maternal chronic hypertension in third trimester 11/01/2017  . Chronic hypertension affecting pregnancy 09/09/2016  . Advanced maternal age in multigravida, first trimester 09/07/2016  . Current every day smoker 09/07/2016  . Obesity (BMI 30-39.9) 09/07/2016  . Consanguinity 09/07/2016  . Eczema 09/07/2016  . Depression with anxiety 12/29/2015  . Attention deficit hyperactivity disorder (ADHD) 12/29/2015  . Low back pain 02/06/2015  . HTN (hypertension) 02/06/2015  . GERD 01/12/2009  . 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 Asthma 07/31/2007  . 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 Migraine headache 09/24/2006    Past Surgical History:  Procedure Laterality  Date  . knee surgeries     x3  . SPINAL FUSION     2006   . TONSILLECTOMY       OB History    Gravida  5   Para  3   Term  3   Preterm      AB  1   Living  3     SAB  1   TAB      Ectopic      Multiple  0   Live Births  1            Home Medications    Prior to Admission medications   Medication Sig Start Date End Date Taking? Authorizing Provider  albuterol (PROVENTIL HFA) 108 (90 Base) MCG/ACT inhaler INHALE 2 PUFFS INTO THE LUNGS EVERY 4 (FOUR) HOURS AS NEEDED FOR WHEEZING OR SHORTNESS OF BREATH. 10/01/17   Nelwyn Salisbury, MD  albuterol (PROVENTIL HFA) 108 (90 Base) MCG/ACT inhaler INHALE 2 PUFFS INTO THE LUNGS EVERY 4 (FOUR) HOURS AS NEEDED FOR WHEEZING OR SHORTNESS OF BREATH. 01/05/18   Nelwyn Salisbury, MD  ALPRAZolam Prudy Feeler) 1 MG tablet Take 1 tablet (1 mg total) by mouth 3 (three) times daily as needed for anxiety. 04/15/18   Nelwyn Salisbury, MD  cholecalciferol (VITAMIN D) 1000 units tablet Take 1,000 Units by mouth daily.    [provider]  clindamycin (CLEOCIN T) 1 % SWAB Apply 1 application topically 2 (two) times daily. 01/01/18   Clent Ridges,  Tera MaterStephen A, MD  fluticasone (FLONASE) 50 MCG/ACT nasal spray Place 2 sprays into both nostrils daily. 02/06/15   Nelwyn SalisburyFry, Stephen A, MD  HYDROcodone-acetaminophen (NORCO/VICODIN) 5-325 MG tablet Take 1-2 tablets by mouth every 6 (six) hours as needed for severe pain. 04/17/18   Eustace MooreNelson, Yvonne Sue, MD  hydrocortisone-pramoxine [facility](ANALPRAM-HC) 2.5-1 % rectal cream hydrocortisone-pramoxine 2.5 %-1 % rectal cream  APPLY TO AFFECTED AREA TWICE A DAY AS NEEDED    [provider]  ibuprofen (ADVIL,MOTRIN) 600 MG tablet Take 1 tablet (600 mg total) by mouth every 6 (six) hours as needed for moderate pain or cramping. 11/03/17   Janeece RiggersGreer, Ellis K, CNM  Iron Combinations (CHROMAGEN) capsule Take 1 capsule by mouth daily.    [provider]  loratadine (CLARITIN) 10 MG tablet Take 10 mg by mouth at bedtime as needed for  allergies.     [provider]  methyldopa (ALDOMET) 250 MG tablet Take 1 tablet (250 mg total) by mouth 2 (two) times daily. 04/15/18   Nelwyn SalisburyFry, Stephen A, MD  methylphenidate (RITALIN) 20 MG tablet Take 1 tablet (20 mg total) by mouth 3 (three) times daily. 05/17/18 06/16/18  Nelwyn SalisburyFry, Stephen A, MD  omeprazole (PRILOSEC) 40 MG capsule TAKE 1 CAPSULE BY MOUTH TWICE A DAY 01/21/18   Nelwyn SalisburyFry, Stephen A, MD  ondansetron (ZOFRAN) 4 MG tablet Take 1 tablet (4 mg total) by mouth every 6 (six) hours. 04/17/18   Eustace MooreNelson, Yvonne Sue, MD  ondansetron (ZOFRAN) 4 MG tablet Take 1-2 tablets (4-8 mg total) by mouth every 6 (six) hours. 04/17/18   Eustace MooreNelson, Yvonne Sue, MD  Prenatal MV-Min-FA-Omega-3 (PRENATAL GUMMIES/DHA & FA PO) Take 1 tablet by mouth daily.    [provider]  Spacer/Aero-Holding Chambers [facility](OPTICHAMBER DIAMOND) MISC Use with inhaler 12/02/17   Nelwyn SalisburyFry, Stephen A, MD  terbinafine (LAMISIL) 250 MG tablet Take 1 tablet (250 mg total) by mouth daily. 01/01/18   Nelwyn SalisburyFry, Stephen A, MD    Family History Family History  Problem Relation Age of Onset  . Birth defects Daughter        pectus excavatum surgery at age 39    Social History Social History   Tobacco Use  . Smoking status: Current Every Day Smoker    Packs/day: 1.00    Types: Cigarettes    Last attempt to quit: 06/16/2012    Years since quitting: 5.8  . Smokeless tobacco: Never Used  Substance Use Topics  . Alcohol use: No    Alcohol/week: 0.0 standard drinks  . Drug use: No     Allergies   Azithromycin and Lamictal [lamotrigine]   Review of Systems Review of Systems  Gastrointestinal: Positive for abdominal pain and nausea.  All other systems reviewed and are negative.    Physical Exam Updated Vital Signs BP (!) 100/56   Pulse 85   Temp 98.5 F (36.9 C) (Oral)   Resp 16   Ht 5\' 2"  (1.575 m)   Wt 90.7 kg   SpO2 100%   BMI 36.58 kg/m   Physical Exam  Constitutional: She is oriented to person, place, and time. She  appears well-developed and well-nourished.  HENT:  Head: Normocephalic.  Eyes: EOM are normal.  Neck: Normal range of motion.  Cardiovascular: Normal rate and regular rhythm.  Pulmonary/Chest: Effort normal.  Abdominal: She exhibits no distension. There is tenderness in the right upper quadrant.  Musculoskeletal: Normal range of motion.  Neurological: She is alert and oriented to person, place, and time.  Skin: Skin  is warm.  Psychiatric: She has a normal mood and affect.  Nursing note and vitals reviewed.    ED Treatments / Results  Labs (all labs ordered are listed, but only abnormal results are displayed) Labs Reviewed  COMPREHENSIVE METABOLIC PANEL - Abnormal; Notable for the following components:      Result Value   Glucose, Bld 100 (*)    All other components within normal limits  LIPASE, BLOOD  CBC  URINALYSIS, ROUTINE W REFLEX MICROSCOPIC  I-STAT BETA HCG BLOOD, ED ([facility], [facility], [facility] ONLY)    EKG None  Radiology US Abdomen Complete  Result Date: 04/22/2018 CLINICAL DATA:  Right upper quadrant pain EXAM: ABDOMEN ULTRASOUND COMPLETE COMPARISON:  None. FINDINGS: Gallbladder: Multiple stones in the gallbladder which is partially contracted. Largest stone measures up to 8 mm. Gallbladder wall mildly prominent 4 mm, likely related to contracted state. Negative sonographic Murphy's. Common bile duct: Diameter: Normal caliber, 3 mm. Liver: No focal lesion identified. Within normal limits in parenchymal echogenicity. Portal vein is patent on color Doppler imaging with normal direction of blood flow towards the liver. IVC: No abnormality visualized. Pancreas: Visualized portion unremarkable. Spleen: Size and appearance within normal limits. Right Kidney: Length: 12.7 cm. Echogenicity within normal limits. No mass or hydronephrosis visualized. Left Kidney: Length: 12.1 cm. Echogenicity within normal limits. No mass or hydronephrosis visualized. Abdominal aorta: No aneurysm visualized. Other  findings: None. IMPRESSION: Multiple mobile gallstones within the gallbladder. Gallbladder is mildly contracted. No sonographic evidence of acute cholecystitis. Electronically Signed   By: Charlett Nose M.D.   On: 04/22/2018 21:37    Procedures Procedures (including critical care time)  Medications Ordered in ED Medications - No data to display   Initial Impression / Assessment and Plan / ED Course  I have reviewed the triage vital signs and the nursing notes.  Pertinent labs & imaging results that were available during my care of the patient were reviewed by me and considered in my medical decision making (see chart for details).     MDM  Pt has normal wbc count,  lfts area normal.  Ultrasound shows gallsyones.  Pt has an appointment to see General surgeon.   Pt advised to keep appointment.  Pt advised to avoid fatty food.  Return if symptoms worsen or change.   Final Clinical Impressions(s) / ED Diagnoses   Final diagnoses:  Calculus of gallbladder without cholecystitis without obstruction    ED Discharge Orders    None    An After Visit Summary was printed and given to the patient.    Osie Cheeks 04/22/18 2149    Charlynne Pander, MD 04/23/18 2008

## 2018-04-22 NOTE — ED Notes (Signed)
Pt driving home, pt states she just wants prescriptions to fill

## 2018-04-22 NOTE — ED Notes (Signed)
Patient verbalizes understanding of discharge instructions. Opportunity for questioning and answers were provided. Armband removed by staff, pt discharged from ED.  

## 2018-04-22 NOTE — Discharge Instructions (Signed)
Keep appointment with general surgeon as scheduled.  Return if any problems.  Bland diet.  Avoid fatty or fried foods.

## 2018-04-22 NOTE — ED Notes (Signed)
ED Provider at bedside. 

## 2018-04-22 NOTE — ED Notes (Signed)
Pt stable and ambulatory for discharge, states understanding follow up.  

## 2018-04-22 NOTE — ED Triage Notes (Signed)
Pt in c/o nausea and upper abd pain, known gallstones, went to urgent care last week and was given nausea mediation but it is not helping

## 2018-04-22 NOTE — ED Notes (Addendum)
Pt states she has to leave to pick her kids up.  Informed her she is next to go back.  Labs need to be drawn.  She states she doesn't have a choice but to leave at this time.  Encouraged her to stay and she states she will be right back as soon as she picks her kids up.

## 2018-04-22 NOTE — ED Notes (Signed)
Patient transported to Ultrasound 

## 2018-04-22 NOTE — ED Notes (Signed)
Updated on wait for treatment room. 

## 2018-04-24 ENCOUNTER — Ambulatory Visit (INDEPENDENT_AMBULATORY_CARE_PROVIDER_SITE_OTHER): Payer: Medicaid Other | Admitting: Orthopedic Surgery

## 2018-05-01 ENCOUNTER — Ambulatory Visit: Payer: Self-pay | Admitting: General Surgery

## 2018-05-01 DIAGNOSIS — K8020 Calculus of gallbladder without cholecystitis without obstruction: Secondary | ICD-10-CM | POA: Diagnosis not present

## 2018-05-01 NOTE — H&P (Signed)
History of Present Illness Diana Nelson(Allisha Harter MD; 05/01/2018 2:59 PM) The patient is a 37 year old female who presents for evaluation of gall stones. Referred by: Dr. Rica MastYvonne Nelson Chief Complaint: Abdominal pain  Patient is a 37 year old female, with a history of anxiety, with a three-month history of right upper quadrant abdominal pain. Patient was recently in the ER secondary to abdominal pain. States this is usually after eating. Patient states that she interestingly hypertrophied tonsils helped. Patient underwent ultrasound which revealed gallstones. I did review this personally. Patient's LFTs were within normal limits. I did review this personally.  Patient's had no previous abdominal surgery.     Past Surgical History Santiago Glad([place]sey [place], [place]; 05/01/2018 2:45 PM) Knee Surgery  Bilateral. Oral Surgery  Spinal Surgery - Lower Back  Tonsillectomy   Diagnostic Studies History Santiago Glad(Kelsey Phillips, CMA; 05/01/2018 2:45 PM) Colonoscopy  never Mammogram  never Pap Smear  1-5 years ago  Allergies Santiago Glad(Kelsey Phillips, CMA; 05/01/2018 2:47 PM) LaMICtal *ANTICONVULSANTS*  Azithromycin *CHEMICALS*  Allergies Reconciled   Medication History Santiago Glad(Kelsey Phillips, CMA; 05/01/2018 2:48 PM) Methylphenidate HCl (20MG  Tablet, Oral) Active. ALPRAZolam (1MG  Tablet, Oral) Active. Ondansetron (4MG  Tablet Disint, Oral) Active. HYDROcodone-Acetaminophen (5-325MG  Tablet, Oral) Active. Methyldopa (250MG  Tablet, Oral) Active. Medications Reconciled  Social History Santiago Glad([place]; 05/01/2018 2:45 PM) Caffeine use  Carbonated beverages, Coffee, Tea. No alcohol use  No drug use  Tobacco use  Current every day smoker.  Family History Santiago Glad([place]sey [place], [place]; 05/01/2018 2:45 PM) Migraine Headache  Mother.  Pregnancy / Birth History Santiago Glad([place]; 05/01/2018 2:45 PM) Age at menarche  10 years. Gravida  5 Length (months) of breastfeeding  3-6 Maternal age   39-20 Para  4 Regular periods   Other Problems Santiago Glad(Kelsey Phillips, CMA; 05/01/2018 2:45 PM) Anxiety Disorder  Asthma  Back Pain  Cholelithiasis  Gastroesophageal Reflux Disease  Hemorrhoids  High blood pressure  Migraine Headache     Review of Systems Diana Nelson(Lexey Fletes MD; 05/01/2018 2:58 PM) General Present- Night Sweats. Not Present- Appetite Loss, Chills, Fatigue, Fever, Weight Gain and Weight Loss. Skin Not Present- Change in Wart/Mole, Dryness, Hives, Jaundice, New Lesions, Non-Healing Wounds, Rash and Ulcer. HEENT Present- Seasonal Allergies. Not Present- Earache, Hearing Loss, Hoarseness, Nose Bleed, Oral Ulcers, Ringing in the Ears, Sinus Pain, Sore Throat, Visual Disturbances, Wears glasses/contact lenses and Yellow Eyes. Respiratory Not Present- Bloody sputum, Chronic Cough, Difficulty Breathing, Snoring and Wheezing. Breast Not Present- Breast Mass, Breast Pain, Nipple Discharge and Skin Changes. Cardiovascular Not Present- Chest Pain, Difficulty Breathing Lying Down, Leg Cramps, Palpitations, Rapid Heart Rate, Shortness of Breath and Swelling of Extremities. Gastrointestinal Present- Indigestion and Nausea. Not Present- Abdominal Pain, Bloating, Bloody Stool, Change in Bowel Habits, Chronic diarrhea, Constipation, Difficulty Swallowing, Excessive gas, Gets full quickly at meals, Hemorrhoids, Rectal Pain and Vomiting. Female Genitourinary Not Present- Frequency, Nocturia, Painful Urination, Pelvic Pain and Urgency. Musculoskeletal Not Present- Back Pain, Joint Pain, Joint Stiffness, Muscle Pain, Muscle Weakness and Swelling of Extremities. Neurological Present- Headaches. Not Present- Decreased Memory, Fainting, Numbness, Seizures, Tingling, Tremor, Trouble walking and Weakness. Psychiatric Present- Anxiety. Not Present- Bipolar, Change in Sleep Pattern, Depression, Fearful and Frequent crying. Endocrine Not Present- Cold Intolerance, Excessive Hunger, Hair Changes, Heat  Intolerance, Hot flashes and New Diabetes. Hematology Not Present- Blood Thinners, Easy Bruising, Excessive bleeding, Gland problems, HIV and Persistent Infections. All other systems negative  Vitals Santiago Glad(Kelsey Phillips CMA; 05/01/2018 2:46 PM) 05/01/2018 2:45 PM Weight: 212.25 lb Height: 62in Body Surface Area: 1.96 m Body Mass Index: 38.82  kg/m  Temp.: 97.61F  Pulse: 109 (Regular)  BP: 158/80 (Sitting, Left Arm, Standard)       Physical Exam Diana Filler MD; 05/01/2018 3:00 PM) The physical exam findings are as follows: Note:Constitutional: No acute distress, conversant, appears stated age  Eyes: Anicteric sclerae, moist conjunctiva, no lid lag  Neck: No thyromegaly, trachea midline, no cervical lymphadenopathy  Lungs: Clear to auscultation biilaterally, normal respiratory effot  Cardiovascular: regular rate & rhythm, no murmurs, no peripheal edema, pedal pulses 2+  GI: Soft, no masses or hepatosplenomegaly, non-tender to palpation  MSK: Normal gait, no clubbing cyanosis, edema  Skin: No rashes, palpation reveals normal skin turgor  Psychiatric: Appropriate judgment and insight, oriented to person, place, and time    Assessment & Plan Diana Filler MD; 05/01/2018 3:00 PM) SYMPTOMATIC CHOLELITHIASIS (K80.20) Impression: 37 year old female with symptomatic cholelithiasis.  1. We will proceed to the operating room for a laparoscopic cholecystectomy  2. Risks and benefits were discussed with the patient to generally include, but not limited to: infection, bleeding, possible need for post op ERCP, damage to the bile ducts, bile leak, and possible need for further surgery. Alternatives were offered and described. All questions were answered and the patient voiced understanding of the procedure and wishes to proceed at this point with a laparoscopic cholecystectomy

## 2018-05-19 NOTE — Pre-Procedure Instructions (Signed)
Diana NuttingHolly K Nelson  05/19/2018      [id number] - [place],  - [address] [facility] [address] [address] [place] [address] Phone: [phone] Fax: [phone]  [facility] [address] - [place], [place] - [address] [facility] [address] [place] [address] Phone: [phone] Fax: [phone]  [id number] - [address], [place] - [address] [place] [id number] Phone: [phone] Fax: [phone]  [id number] [place], [place] - [address] [place] [address] Phone: [phone] Fax: [phone]    Your procedure is scheduled on Tues., Jan. 7, 2020 from 11:45AM-1:00PM   [facility]" at 9:45AM  Call this number if you have problems the morning of surgery:  [phone]   Remember:  Do not eat or drink after midnight on Jan. 6th    Take these medicines the morning of surgery with A SIP OF WATER: Loratadine (CLARITIN), Methyldopa (ALDOMET), and Terbinafine (LAMISIL)    If needed: ALPRAZolam Prudy Feeler(XANAX), Fluticasone (FLONASE), Ondansetron (ZOFRAN ODT), Omeprazole-sodium bicarbonate (ZEGERID), and Albuterol Inhaler-bring with you the day of surgery  As of today, stop taking all Other Aspirin Products, Vitamins, Fish oils, and Herbal medications. Also stop all NSAIDS i.e. Advil, Ibuprofen, Motrin, Aleve, Anaprox, Naproxen, BC, Goody Powders, and all Supplements.   Do not wear jewelry, make-up or nail polish.  Do not wear lotions, powders, or perfumes, or deodorant.  Do not shave 48 hours prior to surgery.    Do not bring valuables to the hospital.  [facility] is not responsible for any belongings or valuables.  Contacts, dentures or bridgework may not be worn into surgery.  Leave your suitcase in the car.  After surgery it may be brought to your room.  For patients admitted to the hospital, discharge time will be determined by your  treatment team.  Patients discharged the day of surgery will not be allowed to drive home.   Special instructions:  [facility]- Preparing For Surgery  Before surgery, you can play an important role. Because skin is not sterile, your skin needs to be as free of germs as possible. You can reduce the number of germs on your skin by washing with CHG (chlorahexidine gluconate) Soap before surgery.  CHG is an antiseptic cleaner which kills germs and bonds with the skin to continue killing germs even after washing.    Oral Hygiene is also important to reduce your risk of infection.  Remember - BRUSH YOUR TEETH THE MORNING OF SURGERY WITH YOUR REGULAR TOOTHPASTE  Please do not use if you have an allergy to CHG or antibacterial soaps. If your skin becomes reddened/irritated stop using the CHG.  Do not shave (including legs and underarms) for at least 48 hours prior to first CHG shower. It is OK to shave your face.  Please follow these instructions carefully.   1. Shower the NIGHT BEFORE SURGERY and the MORNING OF SURGERY with CHG.   2. If you chose to wash your hair, wash your hair first as usual with your normal shampoo.  3. After you shampoo, rinse your hair and body thoroughly to remove the shampoo.  4. Use CHG as you would any other liquid soap. You can apply CHG directly to the skin and wash gently with a scrungie or a clean washcloth.   5. Apply the CHG Soap to your body ONLY FROM THE NECK DOWN.  Do not use on open  wounds or open sores. Avoid contact with your eyes, ears, mouth and genitals (private parts). Wash Face and genitals (private parts)  with your normal soap.  6. Wash thoroughly, paying special attention to the area where your surgery will be performed.  7. Thoroughly rinse your body with warm water from the neck down.  8. DO NOT shower/wash with your normal soap after using and rinsing off the CHG Soap.  9. Pat yourself dry with a CLEAN TOWEL.  10. Wear CLEAN PAJAMAS to bed  the night before surgery, wear comfortable clothes the morning of surgery  11. Place CLEAN SHEETS on your bed the night of your first shower and DO NOT SLEEP WITH PETS.  Day of Surgery:  Do not apply any deodorants/lotions.  Please wear clean clothes to the hospital/surgery center.   Remember to brush your teeth WITH YOUR REGULAR TOOTHPASTE.  Please read over the following fact sheets that you were given. Pain Booklet, Coughing and Deep Breathing and Surgical Site Infection Prevention

## 2018-05-20 NOTE — L&D Delivery Note (Signed)
Delivery Note Labor onset: 04/05/2019  Labor Onset Time: 0130 Complete dilation at 2:20 AM  Onset of pushing at 0220 FHR second stage Cat 1 Analgesia/Anesthesia intrapartum: IV sedation  Involuntary pushing w/ strong maternal urge. Precipitous birth of a viable female. Head, shoulders, and body with the force of one ctx at 0221. Nuchal cord-none. Infant placed on maternal abd, dried, and tactile stim, lusty cry and HR above 100. Cord double clamped and cut by father. Secondary apnea, cyanotic, and HR below 100. Infant to radiant warmer, PPV, tone returned, HR increased, and infant crying. Infant returned to mother and placed skin-to-skin.      Cord blood sample collected Arterial cord blood sample N/A.  Placenta delivered Delena Bali, intact, with 3 VC and 2 cm sized calcification.  Placenta to L&D. Uterine tone firm bleeding minimal  First degree laceration identified. Site hemastatic, mother declines repair  Anesthesia: None Repair: Mother declined EBL (mL): 378 Complications: None APGAR: APGAR (1 MIN): 7  APGAR (5 MINS):  9 APGAR (10 MINS):   Mom to postpartum.  Baby to Couplet care / Skin to Skin.  Diana Eastern MSN, CNM 04/06/2019, 2:43 AM

## 2018-05-21 ENCOUNTER — Inpatient Hospital Stay (HOSPITAL_COMMUNITY)
Admission: RE | Admit: 2018-05-21 | Discharge: 2018-05-21 | Disposition: A | Discharge status: Home / Self Care | Payer: Medicaid Other | Source: Ambulatory Visit

## 2018-05-25 NOTE — Progress Notes (Signed)
Pt called me, states her brother notified her that I had been calling. She gave me a new number to reach her. She states she has called the office last week and again today to reschedule her surgery. She states she never heard back from them. She states she cannot have surgery tomorrow because she has 2 young children and she has no one to help her with after surgery. I asked her if she wanted to cancel the surgery tomorrow and she said yes. I then instructed her to call the office tomorrow to get it rescheduled. She voiced understanding. I then paged Dr. Derrell Lolling, but he did not answer back. I called the answering service for [facility] and they paged Dr. Angelena Form. An OR nurse called me and stated Dr. Cliffton Asters was scrubbed and she was answering his page. I told her that pt was cancelling her surgery for tomorrow scheduled at 11:45 AM. She stated that she would convey this message to Dr. Cliffton Asters. I then called the OR desk and spoke with Alana and gave her same information.

## 2018-05-25 NOTE — Progress Notes (Signed)
Have tried numerous times to reach pt by phone for a pre-op call. The call goes straight to voicemail and then it states voicemail is full. I tried her nephew who we have a DPR on, but his phone is the same. Goes straight to voicemail and voicemail is full. I called the 2nd person listed this AM and he said he would get in touch with her and have her call me. She has not called back. I called Tinnie Gens back and this time it went to voicemail but was able to leave a message. I asked that she call me back if he can get in touch her within the next hour. If not, if he could instruct to arrive at 9:15 AM in the morning, instructed him to tell her not to eat or drink after midnight and not to take any medications in the AM (this person is not on the DPR).

## 2018-05-26 ENCOUNTER — Ambulatory Visit (HOSPITAL_COMMUNITY): Admission: RE | Admit: 2018-05-26 | Payer: Medicaid Other | Source: Ambulatory Visit | Admitting: General Surgery

## 2018-05-26 ENCOUNTER — Encounter (HOSPITAL_COMMUNITY): Admission: RE | Payer: Self-pay | Source: Ambulatory Visit

## 2018-05-26 SURGERY — LAPAROSCOPIC CHOLECYSTECTOMY
Anesthesia: General

## 2018-06-08 ENCOUNTER — Other Ambulatory Visit: Payer: Self-pay | Admitting: Family Medicine

## 2018-06-09 ENCOUNTER — Other Ambulatory Visit: Payer: Self-pay | Admitting: Family Medicine

## 2018-06-09 NOTE — Telephone Encounter (Signed)
Dr. Fry please advise on refill. Thanks  

## 2018-06-11 MED ORDER — METHYLPHENIDATE HCL 20 MG PO TABS: 20.0000 mg | ORAL_TABLET | Freq: Three times a day (TID) | ORAL | 0 refills | Status: DC

## 2018-06-11 NOTE — Telephone Encounter (Signed)
Done

## 2018-06-22 ENCOUNTER — Other Ambulatory Visit: Payer: Self-pay | Admitting: Family Medicine

## 2018-06-22 ENCOUNTER — Encounter: Payer: Self-pay | Admitting: Family Medicine

## 2018-06-22 NOTE — Telephone Encounter (Signed)
Dr. Fry please advise. Thanks  

## 2018-06-23 NOTE — Telephone Encounter (Signed)
She is on the 1 mg and her med list has been updated.

## 2018-06-23 NOTE — Telephone Encounter (Signed)
First, tell her that we cannot give any early refills. Second, her chart lists both Xanax 0.5 mg and 1 mg. Please find out what she is taking and cancel the other one

## 2018-06-24 NOTE — Telephone Encounter (Signed)
Noted  

## 2018-08-27 ENCOUNTER — Ambulatory Visit (INDEPENDENT_AMBULATORY_CARE_PROVIDER_SITE_OTHER): Payer: Medicaid Other | Admitting: Family Medicine

## 2018-08-27 ENCOUNTER — Other Ambulatory Visit: Payer: Self-pay

## 2018-08-27 ENCOUNTER — Encounter: Payer: Self-pay | Admitting: Family Medicine

## 2018-08-27 DIAGNOSIS — J019 Acute sinusitis, unspecified: Secondary | ICD-10-CM | POA: Diagnosis not present

## 2018-08-27 MED ORDER — AMOXICILLIN-POT CLAVULANATE 875-125 MG PO TABS: 1.0000 | ORAL_TABLET | Freq: Two times a day (BID) | ORAL | 0 refills | Status: DC

## 2018-08-27 NOTE — Telephone Encounter (Signed)
Doxy.me has been scheduled for today with the pt.

## 2018-08-27 NOTE — Progress Notes (Signed)
Subjective:    Patient ID: Diana NuttingHolly K D'Amato, female    DOB: Feb 28, 1981, 38 y.o.   MRN: [id number]  HPI Virtual Visit via Video Note  I connected with the patient on 08/27/18 at  9:45 AM EDT by a video enabled telemedicine application and verified that I am speaking with the correct person using two identifiers.  Location patient: home Location provider:work or home office Persons participating in the virtual visit: patient, provider  I discussed the limitations of evaluation and management by telemedicine and the availability of in person appointments. The patient expressed understanding and agreed to proceed.   HPI: For one week she has had sinus pressure, PND, a dry cough, and blowing green mucus from the nose. No fever or headache or SOB or NVD. Using Mucinex , Flonase, and Claritin.    ROS: See pertinent positives and negatives per HPI.  Past Medical History:  Diagnosis Date  . ADHD   . Anemia   . Anxiety    on xanax  . Asthma    uses once or twice a day   . 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 Eczema   . GERD (gastroesophageal reflux disease)   . Hypertension   . Migraines   . PONV (postoperative nausea and vomiting)     Past Surgical History:  Procedure Laterality Date  . knee surgeries     x3  . SPINAL FUSION     2006   . TONSILLECTOMY      Family History  Problem Relation Age of Onset  . Birth defects Daughter        pectus excavatum surgery at age 40     Current Outpatient Medications:  .  albuterol (PROAIR HFA) 108 (90 Base) MCG/ACT inhaler, INHALE 2 PUFFS INTO THE LUNGS EVERY 4 HOURS AS NEEDED FOR WHEEZING OR SHORTNESS OF BREATH., Disp: 8.5 Inhaler, Rfl: 2 .  albuterol (PROVENTIL HFA) 108 (90 Base) MCG/ACT inhaler, INHALE 2 PUFFS INTO THE LUNGS EVERY 4 (FOUR) HOURS AS NEEDED FOR WHEEZING OR SHORTNESS OF BREATH., Disp: 6.7 Inhaler, Rfl: 0 .  ALPRAZolam (XANAX) 1 MG tablet, Take 1 tablet (1 mg total) by mouth 3 (three) times daily as needed for anxiety.,  Disp: 90 tablet, Rfl: 5 .  clindamycin (CLEOCIN T) 1 % SWAB, Apply 1 application topically 2 (two) times daily., Disp: 60 Package, Rfl: 5 .  ferrous sulfate 325 (65 FE) MG tablet, Take 325 mg by mouth daily with breakfast., Disp: , Rfl:  .  fluticasone (FLONASE) 50 MCG/ACT nasal spray, Place 2 sprays into both nostrils daily. (Patient taking differently: Place 2 sprays into both nostrils daily as needed for allergies. ), Disp: 16 g, Rfl: 6 .  HYDROcodone-acetaminophen (NORCO/VICODIN) 5-325 MG tablet, Take 1 tablet by mouth every 4 (four) hours as needed., Disp: 10 tablet, Rfl: 0 .  hydrocortisone-pramoxine (ANALPRAM-HC) 2.5-1 % rectal cream, Place 1 application rectally 2 (two) times daily as needed for hemorrhoids. , Disp: , Rfl:  .  ibuprofen (ADVIL,MOTRIN) 200 MG tablet, Take 600 mg by mouth every 6 (six) hours as needed for moderate pain., Disp: , Rfl:  .  ibuprofen (ADVIL,MOTRIN) 600 MG tablet, Take 1 tablet (600 mg total) by mouth every 6 (six) hours as needed for moderate pain or cramping., Disp: 30 tablet, Rfl: 0 .  loratadine (CLARITIN) 10 MG tablet, Take 10 mg by mouth daily. , Disp: , Rfl:  .  methyldopa (ALDOMET) 250 MG tablet, Take 1 tablet (250 mg total) by  mouth 2 (two) times daily., Disp: 180 tablet, Rfl: 3 .  methylphenidate (RITALIN) 20 MG tablet, Take 1 tablet (20 mg total) by mouth 3 (three) times daily for 30 days., Disp: 90 tablet, Rfl: 0 .  omeprazole (PRILOSEC) 40 MG capsule, TAKE 1 CAPSULE BY MOUTH TWICE A DAY, Disp: 30 capsule, Rfl: 3 .  omeprazole-sodium bicarbonate (ZEGERID) 40-1100 MG capsule, Take 1 capsule by mouth daily before breakfast., Disp: , Rfl:  .  ondansetron (ZOFRAN ODT) 4 MG disintegrating tablet, Take 1 tablet (4 mg total) by mouth every 8 (eight) hours as needed for nausea or vomiting., Disp: 20 tablet, Rfl: 0 .  polyethylene glycol (MIRALAX / GLYCOLAX) packet, Take 17 g by mouth daily as needed for moderate constipation., Disp: , Rfl:  .  Prenatal  MV-Min-FA-Omega-3 (PRENATAL GUMMIES/DHA & FA PO), Take 1 tablet by mouth daily., Disp: , Rfl:  .  Spacer/Aero-Holding Chambers (OPTICHAMBER DIAMOND) MISC, Use with inhaler, Disp: 1 each, Rfl: 1 .  terbinafine (LAMISIL) 250 MG tablet, Take 1 tablet (250 mg total) by mouth daily., Disp: 90 tablet, Rfl: 1 .  amoxicillin-clavulanate (AUGMENTIN) 875-125 MG tablet, Take 1 tablet by mouth 2 (two) times daily., Disp: 20 tablet, Rfl: 0  EXAM:  VITALS per patient if applicable:  GENERAL: alert, oriented, appears well and in no acute distress  HEENT: atraumatic, conjunttiva clear, no obvious abnormalities on inspection of external nose and ears  NECK: normal movements of the head and neck  LUNGS: on inspection no signs of respiratory distress, breathing rate appears normal, no obvious gross SOB, gasping or wheezing  CV: no obvious cyanosis  MS: moves all visible extremities without noticeable abnormality  PSYCH/NEURO: pleasant and cooperative, no obvious depression or anxiety, speech and thought processing grossly intact  ASSESSMENT AND PLAN: Sinusitis, treat with Augmentin. Gershon Crane, MD  Discussed the following assessment and plan:  No diagnosis found.     I discussed the assessment and treatment plan with the patient. The patient was provided an opportunity to ask questions and all were answered. The patient agreed with the plan and demonstrated an understanding of the instructions.   The patient was advised to call back or seek an in-person evaluation if the symptoms worsen or if the condition fails to improve as anticipated.     Review of Systems     Objective:   Physical Exam        Assessment & Plan:

## 2018-09-04 ENCOUNTER — Other Ambulatory Visit: Payer: Self-pay | Admitting: Family Medicine

## 2018-09-09 MED ORDER — METHYLPHENIDATE HCL 20 MG PO TABS: 20.0000 mg | ORAL_TABLET | Freq: Three times a day (TID) | ORAL | 0 refills | Status: DC

## 2018-09-09 NOTE — Telephone Encounter (Signed)
Done

## 2018-09-09 NOTE — Telephone Encounter (Signed)
Dr. Fry please advise on refill. Thanks  

## 2018-09-16 DIAGNOSIS — R87610 Atypical squamous cells of undetermined significance on cytologic smear of cervix (ASC-US): Secondary | ICD-10-CM | POA: Diagnosis not present

## 2018-09-16 DIAGNOSIS — Z124 Encounter for screening for malignant neoplasm of cervix: Secondary | ICD-10-CM | POA: Diagnosis not present

## 2018-09-16 DIAGNOSIS — Z01419 Encounter for gynecological examination (general) (routine) without abnormal findings: Secondary | ICD-10-CM | POA: Diagnosis not present

## 2018-09-16 DIAGNOSIS — N925 Other specified irregular menstruation: Secondary | ICD-10-CM | POA: Diagnosis not present

## 2018-09-16 DIAGNOSIS — O3680X9 Pregnancy with inconclusive fetal viability, other fetus: Secondary | ICD-10-CM | POA: Diagnosis not present

## 2018-09-16 DIAGNOSIS — R112 Nausea with vomiting, unspecified: Secondary | ICD-10-CM | POA: Diagnosis not present

## 2018-09-16 DIAGNOSIS — O10019 Pre-existing essential hypertension complicating pregnancy, unspecified trimester: Secondary | ICD-10-CM | POA: Diagnosis not present

## 2018-09-16 DIAGNOSIS — Z113 Encounter for screening for infections with a predominantly sexual mode of transmission: Secondary | ICD-10-CM | POA: Diagnosis not present

## 2018-09-16 DIAGNOSIS — Z3A09 9 weeks gestation of pregnancy: Secondary | ICD-10-CM | POA: Diagnosis not present

## 2018-09-16 DIAGNOSIS — O3680X0 Pregnancy with inconclusive fetal viability, not applicable or unspecified: Secondary | ICD-10-CM | POA: Diagnosis not present

## 2018-09-16 LAB — OB RESULTS CONSOLE HEPATITIS B SURFACE ANTIGEN: Hepatitis B Surface Ag: NEGATIVE

## 2018-09-16 LAB — OB RESULTS CONSOLE ABO/RH: RH Type: POSITIVE

## 2018-09-16 LAB — OB RESULTS CONSOLE GC/CHLAMYDIA
Chlamydia: NEGATIVE
Gonorrhea: NEGATIVE

## 2018-09-16 LAB — OB RESULTS CONSOLE HIV ANTIBODY (ROUTINE TESTING): HIV: NONREACTIVE

## 2018-09-16 LAB — OB RESULTS CONSOLE RPR: RPR: NONREACTIVE

## 2018-09-16 LAB — OB RESULTS CONSOLE RUBELLA ANTIBODY, IGM: Rubella: IMMUNE

## 2018-09-16 LAB — OB RESULTS CONSOLE ANTIBODY SCREEN: Antibody Screen: NEGATIVE

## 2018-09-30 ENCOUNTER — Other Ambulatory Visit: Payer: Self-pay | Admitting: Family Medicine

## 2018-10-02 ENCOUNTER — Other Ambulatory Visit: Payer: Self-pay | Admitting: Family Medicine

## 2018-10-10 ENCOUNTER — Encounter: Payer: Self-pay | Admitting: Family Medicine

## 2018-10-13 DIAGNOSIS — Z3A13 13 weeks gestation of pregnancy: Secondary | ICD-10-CM | POA: Diagnosis not present

## 2018-10-13 DIAGNOSIS — Z3493 Encounter for supervision of normal pregnancy, unspecified, third trimester: Secondary | ICD-10-CM | POA: Diagnosis not present

## 2018-10-13 DIAGNOSIS — Z3682 Encounter for antenatal screening for nuchal translucency: Secondary | ICD-10-CM | POA: Diagnosis not present

## 2018-10-13 MED ORDER — OMEPRAZOLE 40 MG PO CPDR: 40.0000 mg | DELAYED_RELEASE_CAPSULE | Freq: Two times a day (BID) | ORAL | 3 refills | Status: DC

## 2018-10-13 NOTE — Telephone Encounter (Signed)
Call in #90 with 5 rf 

## 2018-10-13 NOTE — Telephone Encounter (Signed)
Rx has been called in  

## 2018-10-13 NOTE — Telephone Encounter (Signed)
Last fill 04/15/18 Last OV 08/27/2018  Ok to fill?

## 2018-10-31 ENCOUNTER — Other Ambulatory Visit: Payer: Self-pay | Admitting: Family Medicine

## 2018-11-05 NOTE — Telephone Encounter (Signed)
Dr. Fry please advise. Thanks  

## 2018-12-01 DIAGNOSIS — Z363 Encounter for antenatal screening for malformations: Secondary | ICD-10-CM | POA: Diagnosis not present

## 2018-12-01 DIAGNOSIS — O09899 Supervision of other high risk pregnancies, unspecified trimester: Secondary | ICD-10-CM | POA: Diagnosis not present

## 2018-12-01 DIAGNOSIS — Z3A20 20 weeks gestation of pregnancy: Secondary | ICD-10-CM | POA: Diagnosis not present

## 2018-12-03 ENCOUNTER — Other Ambulatory Visit: Payer: Self-pay | Admitting: Family Medicine

## 2018-12-07 ENCOUNTER — Other Ambulatory Visit: Payer: Self-pay | Admitting: Family Medicine

## 2018-12-08 ENCOUNTER — Encounter: Payer: Self-pay | Admitting: Family Medicine

## 2018-12-08 NOTE — Telephone Encounter (Signed)
Last filled 11/09/18 last OV 08/24/2018  Ok to fill?

## 2018-12-09 MED ORDER — METHYLPHENIDATE HCL 20 MG PO TABS: 20.0000 mg | ORAL_TABLET | Freq: Three times a day (TID) | ORAL | 0 refills | Status: DC

## 2018-12-09 NOTE — Telephone Encounter (Signed)
I sent these in today  

## 2018-12-09 NOTE — Telephone Encounter (Signed)
Done

## 2019-01-01 DIAGNOSIS — Z362 Encounter for other antenatal screening follow-up: Secondary | ICD-10-CM | POA: Diagnosis not present

## 2019-01-01 DIAGNOSIS — Z3A24 24 weeks gestation of pregnancy: Secondary | ICD-10-CM | POA: Diagnosis not present

## 2019-01-13 ENCOUNTER — Encounter: Payer: Self-pay | Admitting: Family Medicine

## 2019-01-13 ENCOUNTER — Telehealth (INDEPENDENT_AMBULATORY_CARE_PROVIDER_SITE_OTHER): Payer: Medicaid Other | Admitting: Family Medicine

## 2019-01-13 ENCOUNTER — Other Ambulatory Visit: Payer: Self-pay

## 2019-01-13 DIAGNOSIS — K130 Diseases of lips: Secondary | ICD-10-CM

## 2019-01-13 DIAGNOSIS — J019 Acute sinusitis, unspecified: Secondary | ICD-10-CM

## 2019-01-13 MED ORDER — KETOCONAZOLE 2 % EX CREA: 1.0000 "application " | TOPICAL_CREAM | Freq: Two times a day (BID) | CUTANEOUS | 5 refills | Status: DC

## 2019-01-13 MED ORDER — FLUCONAZOLE 150 MG PO TABS: 150.0000 mg | ORAL_TABLET | Freq: Once | ORAL | 5 refills | Status: DC

## 2019-01-13 MED ORDER — AMOXICILLIN-POT CLAVULANATE 875-125 MG PO TABS: 1.0000 | ORAL_TABLET | Freq: Two times a day (BID) | ORAL | 0 refills | Status: DC

## 2019-01-13 NOTE — Telephone Encounter (Signed)
Patient has been scheduled. Nothing further needed.  

## 2019-01-13 NOTE — Progress Notes (Signed)
Virtual Visit via Video Note  I connected with the patient on 01/13/19 at  1:30 PM EDT by a video enabled telemedicine application and verified that I am speaking with the correct person using two identifiers.  Location patient: home Location provider:work or home office Persons participating in the virtual visit: patient, provider  I discussed the limitations of evaluation and management by telemedicine and the availability of in person appointments. The patient expressed understanding and agreed to proceed.   HPI: Here for several; issues. First for the past month she has had redness and irritation in the corners of the mouth. Using Chapstik does not help. Also one week ago she developed sinus pressure, PND and coughing up yellow sputum. Using Mucinex. No headache or fever or SOB or NVD.    ROS: See pertinent positives and negatives per HPI.  Past Medical History:  Diagnosis Date  . ADHD   . Anemia   . Anxiety    on xanax  . Asthma    uses once or twice a day   . 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 Eczema   . GERD (gastroesophageal reflux disease)   . Hypertension   . Migraines   . PONV (postoperative nausea and vomiting)     Past Surgical History:  Procedure Laterality Date  . knee surgeries     x3  . SPINAL FUSION     2006   . TONSILLECTOMY      Family History  Problem Relation Age of Onset  . Birth defects Daughter        pectus excavatum surgery at age 40     Current Outpatient Medications:  .  albuterol (PROAIR HFA) 108 (90 Base) MCG/ACT inhaler, INHALE 2 PUFFS BY MOUTH EVERY 4 HOURS AS NEEDED FOR WHEEZE OR FOR SHORTNESS OF BREATH, Disp: 8.5 Inhaler, Rfl: 2 .  albuterol (PROVENTIL HFA) 108 (90 Base) MCG/ACT inhaler, INHALE 2 PUFFS INTO THE LUNGS EVERY 4 (FOUR) HOURS AS NEEDED FOR WHEEZING OR SHORTNESS OF BREATH., Disp: 6.7 Inhaler, Rfl: 0 .  ALPRAZolam (XANAX) 1 MG tablet, TAKE 1 TABLET BY MOUTH 3 TIMES A DAY AS NEEDED FOR ANXIETY, Disp: 90 tablet, Rfl: 5 .   amoxicillin-clavulanate (AUGMENTIN) 875-125 MG tablet, Take 1 tablet by mouth 2 (two) times daily., Disp: 20 tablet, Rfl: 0 .  clindamycin (CLEOCIN T) 1 % SWAB, APPLY TO AFFECTED AREA TWICE A DAY, Disp: 60 each, Rfl: 5 .  ferrous sulfate 325 (65 FE) MG tablet, Take 325 mg by mouth daily with breakfast., Disp: , Rfl:  .  fluconazole (DIFLUCAN) 150 MG tablet, Take 1 tablet (150 mg total) by mouth once for 1 dose., Disp: 1 tablet, Rfl: 5 .  fluticasone (FLONASE) 50 MCG/ACT nasal spray, Place 2 sprays into both nostrils daily. (Patient taking differently: Place 2 sprays into both nostrils daily as needed for allergies. ), Disp: 16 g, Rfl: 6 .  HYDROcodone-acetaminophen (NORCO/VICODIN) 5-325 MG tablet, Take 1 tablet by mouth every 4 (four) hours as needed., Disp: 10 tablet, Rfl: 0 .  hydrocortisone-pramoxine (ANALPRAM-HC) 2.5-1 % rectal cream, Place 1 application rectally 2 (two) times daily as needed for hemorrhoids. , Disp: , Rfl:  .  ibuprofen (ADVIL,MOTRIN) 200 MG tablet, Take 600 mg by mouth every 6 (six) hours as needed for moderate pain., Disp: , Rfl:  .  ibuprofen (ADVIL,MOTRIN) 600 MG tablet, Take 1 tablet (600 mg total) by mouth every 6 (six) hours as needed for moderate pain or cramping., Disp: 30 tablet,  Rfl: 0 .  ketoconazole (NIZORAL) 2 % cream, Apply 1 application topically 2 (two) times daily., Disp: 15 g, Rfl: 5 .  loratadine (CLARITIN) 10 MG tablet, Take 10 mg by mouth daily. , Disp: , Rfl:  .  methyldopa (ALDOMET) 250 MG tablet, Take 1 tablet (250 mg total) by mouth 2 (two) times daily., Disp: 180 tablet, Rfl: 3 .  [START ON 02/09/2019] methylphenidate (RITALIN) 20 MG tablet, Take 1 tablet (20 mg total) by mouth 3 (three) times daily., Disp: 90 tablet, Rfl: 0 .  omeprazole (PRILOSEC) 40 MG capsule, Take 1 capsule (40 mg total) by mouth 2 (two) times daily., Disp: 30 capsule, Rfl: 3 .  omeprazole-sodium bicarbonate (ZEGERID) 40-1100 MG capsule, Take 1 capsule by mouth daily before  breakfast., Disp: , Rfl:  .  ondansetron (ZOFRAN ODT) 4 MG disintegrating tablet, Take 1 tablet (4 mg total) by mouth every 8 (eight) hours as needed for nausea or vomiting., Disp: 20 tablet, Rfl: 0 .  polyethylene glycol (MIRALAX / GLYCOLAX) packet, Take 17 g by mouth daily as needed for moderate constipation., Disp: , Rfl:  .  Prenatal MV-Min-FA-Omega-3 (PRENATAL GUMMIES/DHA & FA PO), Take 1 tablet by mouth daily., Disp: , Rfl:  .  Spacer/Aero-Holding Chambers ([place]) MISC, Use with inhaler, Disp: 1 each, Rfl: 1 .  terbinafine (LAMISIL) 250 MG tablet, Take 1 tablet (250 mg total) by mouth daily., Disp: 90 tablet, Rfl: 1  EXAM:  VITALS per patient if applicable:  GENERAL: alert, oriented, appears well and in no acute distress  HEENT: atraumatic, conjunttiva clear, no obvious abnormalities on inspection of external nose and ears  NECK: normal movements of the head and neck  LUNGS: on inspection no signs of respiratory distress, breathing rate appears normal, no obvious gross SOB, gasping or wheezing  CV: no obvious cyanosis  MS: moves all visible extremities without noticeable abnormality  PSYCH/NEURO: pleasant and cooperative, no obvious depression or anxiety, speech and thought processing grossly intact  ASSESSMENT AND PLAN: She has a sinusitis, we will treat this with Augmentin. She also has angular chelitis, which we will treat with Ketoconazole. Alysia Penna, MD  Discussed the following assessment and plan:  No diagnosis found.     I discussed the assessment and treatment plan with the patient. The patient was provided an opportunity to ask questions and all were answered. The patient agreed with the plan and demonstrated an understanding of the instructions.   The patient was advised to call back or seek an in-person evaluation if the symptoms worsen or if the condition fails to improve as anticipated.

## 2019-02-08 DIAGNOSIS — Z3A30 30 weeks gestation of pregnancy: Secondary | ICD-10-CM | POA: Diagnosis not present

## 2019-02-08 DIAGNOSIS — I10 Essential (primary) hypertension: Secondary | ICD-10-CM | POA: Diagnosis not present

## 2019-02-24 DIAGNOSIS — O10019 Pre-existing essential hypertension complicating pregnancy, unspecified trimester: Secondary | ICD-10-CM | POA: Diagnosis not present

## 2019-02-24 DIAGNOSIS — Z3A32 32 weeks gestation of pregnancy: Secondary | ICD-10-CM | POA: Diagnosis not present

## 2019-02-24 DIAGNOSIS — I10 Essential (primary) hypertension: Secondary | ICD-10-CM | POA: Diagnosis not present

## 2019-02-26 ENCOUNTER — Other Ambulatory Visit: Payer: Self-pay | Admitting: Family Medicine

## 2019-02-26 MED ORDER — METHYLPHENIDATE HCL 20 MG PO TABS: 20.0000 mg | ORAL_TABLET | Freq: Three times a day (TID) | ORAL | 0 refills | Status: DC

## 2019-02-26 NOTE — Telephone Encounter (Signed)
Done

## 2019-02-26 NOTE — Telephone Encounter (Signed)
Routing to PCP to refill.

## 2019-03-03 DIAGNOSIS — I10 Essential (primary) hypertension: Secondary | ICD-10-CM | POA: Diagnosis not present

## 2019-03-03 DIAGNOSIS — Z3A33 33 weeks gestation of pregnancy: Secondary | ICD-10-CM | POA: Diagnosis not present

## 2019-03-04 DIAGNOSIS — Z3A33 33 weeks gestation of pregnancy: Secondary | ICD-10-CM | POA: Diagnosis not present

## 2019-03-04 DIAGNOSIS — Z23 Encounter for immunization: Secondary | ICD-10-CM | POA: Diagnosis not present

## 2019-03-04 DIAGNOSIS — Z3483 Encounter for supervision of other normal pregnancy, third trimester: Secondary | ICD-10-CM | POA: Diagnosis not present

## 2019-03-10 DIAGNOSIS — I10 Essential (primary) hypertension: Secondary | ICD-10-CM | POA: Diagnosis not present

## 2019-03-10 DIAGNOSIS — Z3A34 34 weeks gestation of pregnancy: Secondary | ICD-10-CM | POA: Diagnosis not present

## 2019-03-12 DIAGNOSIS — O10019 Pre-existing essential hypertension complicating pregnancy, unspecified trimester: Secondary | ICD-10-CM | POA: Diagnosis not present

## 2019-03-12 DIAGNOSIS — Z3A34 34 weeks gestation of pregnancy: Secondary | ICD-10-CM | POA: Diagnosis not present

## 2019-03-12 DIAGNOSIS — O09513 Supervision of elderly primigravida, third trimester: Secondary | ICD-10-CM | POA: Diagnosis not present

## 2019-03-12 DIAGNOSIS — Z23 Encounter for immunization: Secondary | ICD-10-CM | POA: Diagnosis not present

## 2019-03-17 DIAGNOSIS — Z3A35 35 weeks gestation of pregnancy: Secondary | ICD-10-CM | POA: Diagnosis not present

## 2019-03-17 DIAGNOSIS — I10 Essential (primary) hypertension: Secondary | ICD-10-CM | POA: Diagnosis not present

## 2019-03-17 DIAGNOSIS — O10019 Pre-existing essential hypertension complicating pregnancy, unspecified trimester: Secondary | ICD-10-CM | POA: Diagnosis not present

## 2019-03-19 ENCOUNTER — Encounter (HOSPITAL_COMMUNITY): Payer: Self-pay | Admitting: *Deleted

## 2019-03-19 ENCOUNTER — Telehealth (HOSPITAL_COMMUNITY): Payer: Self-pay | Admitting: *Deleted

## 2019-03-19 NOTE — Telephone Encounter (Signed)
Preadmission screen  

## 2019-03-25 DIAGNOSIS — Z3A36 36 weeks gestation of pregnancy: Secondary | ICD-10-CM | POA: Diagnosis not present

## 2019-03-25 DIAGNOSIS — I10 Essential (primary) hypertension: Secondary | ICD-10-CM | POA: Diagnosis not present

## 2019-03-25 DIAGNOSIS — Z362 Encounter for other antenatal screening follow-up: Secondary | ICD-10-CM | POA: Diagnosis not present

## 2019-03-25 LAB — OB RESULTS CONSOLE GBS: GBS: NEGATIVE

## 2019-03-29 ENCOUNTER — Other Ambulatory Visit: Payer: Self-pay | Admitting: Obstetrics & Gynecology

## 2019-03-30 ENCOUNTER — Encounter (HOSPITAL_COMMUNITY): Payer: Self-pay | Admitting: *Deleted

## 2019-04-01 DIAGNOSIS — Z3492 Encounter for supervision of normal pregnancy, unspecified, second trimester: Secondary | ICD-10-CM | POA: Diagnosis not present

## 2019-04-01 DIAGNOSIS — O133 Gestational [pregnancy-induced] hypertension without significant proteinuria, third trimester: Secondary | ICD-10-CM | POA: Diagnosis not present

## 2019-04-01 DIAGNOSIS — O09513 Supervision of elderly primigravida, third trimester: Secondary | ICD-10-CM | POA: Diagnosis not present

## 2019-04-01 DIAGNOSIS — Z3A37 37 weeks gestation of pregnancy: Secondary | ICD-10-CM | POA: Diagnosis not present

## 2019-04-01 DIAGNOSIS — O10019 Pre-existing essential hypertension complicating pregnancy, unspecified trimester: Secondary | ICD-10-CM | POA: Diagnosis not present

## 2019-04-01 DIAGNOSIS — I10 Essential (primary) hypertension: Secondary | ICD-10-CM | POA: Diagnosis not present

## 2019-04-03 ENCOUNTER — Other Ambulatory Visit: Payer: Self-pay

## 2019-04-03 ENCOUNTER — Other Ambulatory Visit (HOSPITAL_COMMUNITY)
Admission: RE | Admit: 2019-04-03 | Discharge: 2019-04-03 | Disposition: A | Discharge status: Home / Self Care | Payer: Medicaid Other | Source: Ambulatory Visit | Attending: Obstetrics & Gynecology | Admitting: Obstetrics & Gynecology

## 2019-04-03 DIAGNOSIS — Z20828 Contact with and (suspected) exposure to other viral communicable diseases: Secondary | ICD-10-CM | POA: Diagnosis not present

## 2019-04-03 DIAGNOSIS — Z01812 Encounter for preprocedural laboratory examination: Secondary | ICD-10-CM | POA: Insufficient documentation

## 2019-04-03 LAB — SARS CORONAVIRUS 2 (TAT 6-24 HRS): SARS Coronavirus 2: NEGATIVE

## 2019-04-03 NOTE — MAU Note (Signed)
Pt here for PAT covid swab, denies symptoms. Swab collected. 

## 2019-04-04 NOTE — H&P (Signed)
Diana Nelson is a 38 y.o. female, [id number], IUP at 8 weeks, presenting for IOL due to [facility] on aldomet  daily. Baseline labs were neg and all unremarkable for preeclampsia on 09/16/2018. Pt currently denies HA, ruq pain or vision changes. Anxiety (no meds), smoker, AMA, cosanguinity (married to first cousin). EFW 6.3lbs on 11/5 vis growth scan. BPP 8/8 on 11/12. GBS-. Baby female. Pt endorse + Fm. Denies vaginal leakage. Denies vaginal bleeding. Denies feeling cxt's.   Patient Active Problem List   Diagnosis Date Noted  . Chronic hypertension complicating or reason for care during pregnancy, third trimester 04/05/2019  . Acne vulgaris 01/01/2018  . Onychomycosis 01/01/2018  . Maternal chronic hypertension in third trimester 11/01/2017  . Chronic hypertension affecting pregnancy 09/09/2016  . Advanced maternal age in multigravida, first trimester 09/07/2016  . Current every day smoker 09/07/2016  . Obesity (BMI 30-39.9) 09/07/2016  . Consanguinity 09/07/2016  . Eczema 09/07/2016  . Depression with anxiety 12/29/2015  . Attention deficit hyperactivity disorder (ADHD) 12/29/2015  . Low back pain 02/06/2015  . HTN (hypertension) 02/06/2015  . GERD 01/12/2009  . 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 Asthma 07/31/2007  . 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 Migraine headache 09/24/2006     Medications Prior to Admission  Medication Sig Dispense Refill Last Dose  . albuterol (PROVENTIL HFA) 108 (90 Base) MCG/ACT inhaler INHALE 2 PUFFS INTO THE LUNGS EVERY 4 (FOUR) HOURS AS NEEDED FOR WHEEZING OR SHORTNESS OF BREATH. 6.7 Inhaler 0 04/04/2019 at Unknown time  . ALPRAZolam (XANAX) 1 MG tablet TAKE 1 TABLET BY MOUTH 3 TIMES A DAY AS NEEDED FOR ANXIETY 90 tablet 5 Past Week at Unknown time  . ferrous sulfate 325 (65 FE) MG tablet Take 325 mg by mouth daily with breakfast.   Past Month at Unknown time  . fluticasone (FLONASE) 50 MCG/ACT nasal spray Place 2 sprays into both nostrils  daily. (Patient taking differently: Place 2 sprays into both nostrils daily as needed for allergies. ) 16 g 6 04/04/2019 at Unknown time  . albuterol (VENTOLIN HFA) 108 (90 Base) MCG/ACT inhaler INHALE 2 PUFFS BY MOUTH EVERY 4 HOURS AS NEEDED FOR WHEEZE OR FOR SHORTNESS OF BREATH 18 g 2   . amoxicillin-clavulanate (AUGMENTIN) 875-125 MG tablet Take 1 tablet by mouth 2 (two) times daily. 20 tablet 0 More than a month at Unknown time  . clindamycin (CLEOCIN T) 1 % SWAB APPLY TO AFFECTED AREA TWICE A DAY 60 each 5   . HYDROcodone-acetaminophen (NORCO/VICODIN) 5-325 MG tablet Take 1 tablet by mouth every 4 (four) hours as needed. 10 tablet 0 More than a month at Unknown time  . hydrocortisone-pramoxine (ANALPRAM-HC) 2.5-1 % rectal cream Place 1 application rectally 2 (two) times daily as needed for hemorrhoids.      Marland Kitchen ibuprofen (ADVIL,MOTRIN) 200 MG tablet Take 600 mg by mouth every 6 (six) hours as needed for moderate pain.     Marland Kitchen ibuprofen (ADVIL,MOTRIN) 600 MG tablet Take 1 tablet (600 mg total) by mouth every 6 (six) hours as needed for moderate pain or cramping. 30 tablet 0   . ketoconazole (NIZORAL) 2 % cream Apply 1 application topically 2 (two) times daily. 15 g 5   . loratadine (CLARITIN) 10 MG tablet Take 10 mg by mouth daily.      . methyldopa (ALDOMET) 250 MG tablet Take 1 tablet (250 mg total) by mouth 2 (two) times daily. 180 tablet 3   . [START ON 05/11/2019] methylphenidate (RITALIN) 20 MG  tablet Take 1 tablet (20 mg total) by mouth 3 (three) times daily. 90 tablet 0   . omeprazole (PRILOSEC) 40 MG capsule Take 1 capsule (40 mg total) by mouth 2 (two) times daily. 30 capsule 3   . omeprazole-sodium bicarbonate (ZEGERID) 40-1100 MG capsule Take 1 capsule by mouth daily before breakfast.     . ondansetron (ZOFRAN ODT) 4 MG disintegrating tablet Take 1 tablet (4 mg total) by mouth every 8 (eight) hours as needed for nausea or vomiting. 20 tablet 0   . polyethylene glycol (MIRALAX / GLYCOLAX)  packet Take 17 g by mouth daily as needed for moderate constipation.     . Prenatal MV-Min-FA-Omega-3 (PRENATAL GUMMIES/DHA & FA PO) Take 1 tablet by mouth daily.     Marland Kitchen Spacer/Aero-Holding Chambers [facility] DIAMOND) MISC Use with inhaler 1 each 1   . terbinafine (LAMISIL) 250 MG tablet Take 1 tablet (250 mg total) by mouth daily. 90 tablet 1     Past Medical History:  Diagnosis Date  . ADHD   . Anemia   . Anxiety    on xanax  . Asthma    uses once or twice a day   . 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 Eczema   . GERD (gastroesophageal reflux disease)   . Hypertension   . Migraines   . PONV (postoperative nausea and vomiting)      No current facility-administered medications on file prior to encounter.    Current Outpatient Medications on File Prior to Encounter  Medication Sig Dispense Refill  . albuterol (PROVENTIL HFA) 108 (90 Base) MCG/ACT inhaler INHALE 2 PUFFS INTO THE LUNGS EVERY 4 (FOUR) HOURS AS NEEDED FOR WHEEZING OR SHORTNESS OF BREATH. 6.7 Inhaler 0  . ALPRAZolam (XANAX) 1 MG tablet TAKE 1 TABLET BY MOUTH 3 TIMES A DAY AS NEEDED FOR ANXIETY 90 tablet 5  . ferrous sulfate 325 (65 FE) MG tablet Take 325 mg by mouth daily with breakfast.    . fluticasone (FLONASE) 50 MCG/ACT nasal spray Place 2 sprays into both nostrils daily. (Patient taking differently: Place 2 sprays into both nostrils daily as needed for allergies. ) 16 g 6  . albuterol (VENTOLIN HFA) 108 (90 Base) MCG/ACT inhaler INHALE 2 PUFFS BY MOUTH EVERY 4 HOURS AS NEEDED FOR WHEEZE OR FOR SHORTNESS OF BREATH 18 g 2  . amoxicillin-clavulanate (AUGMENTIN) 875-125 MG tablet Take 1 tablet by mouth 2 (two) times daily. 20 tablet 0  . clindamycin (CLEOCIN T) 1 % SWAB APPLY TO AFFECTED AREA TWICE A DAY 60 each 5  . HYDROcodone-acetaminophen (NORCO/VICODIN) 5-325 MG tablet Take 1 tablet by mouth every 4 (four) hours as needed. 10 tablet 0  . hydrocortisone-pramoxine (ANALPRAM-HC) 2.5-1 % rectal cream Place 1 application  rectally 2 (two) times daily as needed for hemorrhoids.     Marland Kitchen ibuprofen (ADVIL,MOTRIN) 200 MG tablet Take 600 mg by mouth every 6 (six) hours as needed for moderate pain.    Marland Kitchen ibuprofen (ADVIL,MOTRIN) 600 MG tablet Take 1 tablet (600 mg total) by mouth every 6 (six) hours as needed for moderate pain or cramping. 30 tablet 0  . ketoconazole (NIZORAL) 2 % cream Apply 1 application topically 2 (two) times daily. 15 g 5  . loratadine (CLARITIN) 10 MG tablet Take 10 mg by mouth daily.     . methyldopa (ALDOMET) 250 MG tablet Take 1 tablet (250 mg total) by mouth 2 (two) times daily. 180 tablet 3  . [START ON 05/11/2019] methylphenidate (RITALIN)  20 MG tablet Take 1 tablet (20 mg total) by mouth 3 (three) times daily. 90 tablet 0  . omeprazole (PRILOSEC) 40 MG capsule Take 1 capsule (40 mg total) by mouth 2 (two) times daily. 30 capsule 3  . omeprazole-sodium bicarbonate (ZEGERID) 40-1100 MG capsule Take 1 capsule by mouth daily before breakfast.    . ondansetron (ZOFRAN ODT) 4 MG disintegrating tablet Take 1 tablet (4 mg total) by mouth every 8 (eight) hours as needed for nausea or vomiting. 20 tablet 0  . polyethylene glycol (MIRALAX / GLYCOLAX) packet Take 17 g by mouth daily as needed for moderate constipation.    . Prenatal MV-Min-FA-Omega-3 (PRENATAL GUMMIES/DHA & FA PO) Take 1 tablet by mouth daily.    Marland Kitchen Spacer/Aero-Holding Chambers [facility] DIAMOND) MISC Use with inhaler 1 each 1  . terbinafine (LAMISIL) 250 MG tablet Take 1 tablet (250 mg total) by mouth daily. 90 tablet 1     Allergies  Allergen Reactions  . Azithromycin Nausea Only  . Lamictal [Lamotrigine] Rash    History of present pregnancy: Pt Info/Preference:  Screening/Consents:  Labs:   EDD: Estimated Date of Delivery: 04/19/19  Establised: Patient's last menstrual period was 07/13/2018.  Anatomy Scan: Date: 01/01/2019 Placenta Location: anterior Genetic Screen: Panoroma:declined AFP: declined First Tri: Normal range Quad:   Office: ccob             First PNV: 13.1 wg Blood Type --/--/O POS, O POS Performed at [facility], [address], [place], [place] [address]  [phone])  Language: english Last PNV: 37 wg Rhogam  n/a  Flu Vaccine:  utd   Antibody NEG (11/16 0025)  TDaP vaccine utd   GTT: Early: 5.3 hga1c Third Trimester: 130  Feeding Plan: breast BTL: no Rubella: Immune (04/29 0000)  Contraception: ??? VBAC: no RPR: Nonreactive (04/29 0000)   Circumcision: Female fetus. n/a   HBsAg: Negative (04/29 0000)  Pediatrician:  ???   HIV: Non-reactive (04/29 0000)   Prenatal Classes: no Additional [place]: BPP 8/8 on 11/12, growth on 11/5 see below.  GBS: Negative/-- (11/05 0000) Negative      Chlamydia: neg    MFM Referral/Consult:  GC: neg  Support Person: husband   PAP: 2020-ASCUS  Pain Management: epidural Neonatologist Referral:  Hgb Electrophoresis:  AA  Birth Plan: no   Hgb NOB: 12    28W: 10.4  Growth scan 03/25/2019:  OB History    Gravida  6   Para  3   Term  3   Preterm      AB  1   Living  4     SAB  1   TAB      Ectopic      Multiple  0   Live Births  1          Past Medical History:  Diagnosis Date  . ADHD   . Anemia   . Anxiety    on xanax  . Asthma    uses once or twice a day   . 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 Eczema   . GERD (gastroesophageal reflux disease)   . Hypertension   . Migraines   . PONV (postoperative nausea and vomiting)    Past Surgical History:  Procedure Laterality Date  . knee surgeries     x3  . SPINAL FUSION     2006   . TONSILLECTOMY     Family History: family history includes Birth defects  in her daughter; Diabetes in her mother. Social History:  reports that she has been smoking cigarettes. She has been smoking about 1.00 pack per day. She has never used smokeless tobacco. She reports that she does not drink alcohol or use drugs.   Prenatal Transfer Tool  Maternal Diabetes: No Genetic Screening: Normal Maternal  Ultrasounds/Referrals: Normal Fetal Ultrasounds or other Referrals:  None Maternal Substance Abuse:  No Significant Maternal Medications:  Meds include: Other: Aldomet Significant Maternal Lab Results: Group B Strep negative  ROS:  Review of Systems  Constitutional: Negative.   HENT: Negative.   Eyes: Negative.   Respiratory: Negative.   Cardiovascular: Negative.   Gastrointestinal: Negative.   Genitourinary: Negative.   Musculoskeletal: Negative.   Skin: Negative.   Neurological: Negative.   Endo/Heme/Allergies: Negative.   Psychiatric/Behavioral: Negative.      Physical Exam: BP 114/73   Pulse 80   Temp 98.3 F (36.8 C) (Oral)   Resp 18   Ht 5\' 2"  (1.575 m)   Wt 104.4 kg   LMP 07/13/2018   BMI 42.10 kg/m   Physical Exam  Constitutional: She is oriented to person, place, and time and well-developed, well-nourished, and in no distress.  HENT:  Head: Normocephalic and atraumatic.  Eyes: Pupils are equal, round, and reactive to light. Conjunctivae are normal.  Neck: Normal range of motion. Neck supple.  Cardiovascular: Normal rate and regular rhythm.  Pulmonary/Chest: Effort normal and breath sounds normal.  Abdominal: Soft. Bowel sounds are normal.  Genitourinary:    Genitourinary Comments: Uterus garvida and non-tender, pelvis adequate for vaginal delivery.    Neurological: She is alert and oriented to person, place, and time. She has normal reflexes. Gait normal.  No clonus  Skin: Skin is warm and dry.  Psychiatric: Affect normal.  Nursing note and vitals reviewed.    NST: FHR baseline 145 bpm, Variability: moderate, Accelerations:present, Decelerations:  Absent= Cat 1/Reactive UC:   none, uterine irritability  SVE:   Dilation: 1 Effacement (%): Thick Station: Ballotable Exam by:: Gillian ShieldsJ Sanchez RN, vertex verified by fetal sutures, checked in office by Dr Normand Sloopillard. .  Leopold's: Position vertex, EFW 6.5 lbs. via leopold's.   Labs: Results for orders placed or  performed during the hospital encounter of 04/05/19 (from the past 24 hour(s))  CBC     Status: Abnormal   Collection Time: 04/05/19 12:25 AM  Result Value Ref Range   WBC 16.4 (H) 4.0 - 10.5 K/uL   RBC 3.94 3.87 - 5.11 MIL/uL   Hemoglobin 12.0 12.0 - 15.0 g/dL   HCT 16.136.3 09.636.0 - 04.546.0 %   MCV 92.1 80.0 - 100.0 fL   MCH 30.5 26.0 - 34.0 pg   MCHC 33.1 30.0 - 36.0 g/dL   RDW 40.913.6 81.111.5 - 91.415.5 %   Platelets 204 150 - 400 K/uL   nRBC 0.0 0.0 - 0.2 %  Type and screen     Status: None   Collection Time: 04/05/19 12:25 AM  Result Value Ref Range   ABO/RH(D) O POS    Antibody Screen NEG    Sample Expiration      04/08/2019,2359 Performed at [facility], [address] [place], [place] [address]   ABO/Rh     Status: None (Preliminary result)   Collection Time: 04/05/19 12:25 AM  Result Value Ref Range   ABO/RH(D)      O POS Performed at [facility], [address] [place], [place] [address]  Imaging:  No results found.  MAU Course: Orders Placed This Encounter  Procedures  . OB RESULT CONSOLE Group B Strep  . CBC  . RPR  . Diet clear liquid Room service appropriate? Yes; Fluid consistency: Thin  . Discontinue Pitocin if tachysystole with non-reassuring FHR is present  . Evaluate fetal heart rate to establish reassuring pattern prior to initiating Cytotec or Pitocin  . If tachysystole WITH reassuring FHR present notify MD / CNM  . Initiate intrauterine resuscitation if tachysystole with non-reasuring FHR is present  . Labor Induction  . May administer Terbutaline 0.25 mg SQ x 1 dose if tachysystole with non-reassuring FHR is presesnt  . Nofify MD/CNM if tachysystole with non-reassuring FHR is present  . Perform a cervical exam prior to initiating Cytotec or Pitocin  . Activity as tolerated  . Fetal monitoring per unit policy  . Notify Physician  . Vitals signs per unit policy  . Order Rapid HIV per protocol if no results on chart  . Cervical Exam  .  Discontinue foley prior to vaginal delivery  . Fundal check post delivery every 15 min x 1 hour then every 30 min x 1 hour  . If Rapid HIV test positive or known HIV positive: initiate AZT orders  . Initiate Carrier Fluid Protocol  . Initiate Oral Care Protocol  . Insert foley catheter  . May in and out cath x 2 for inability to void  . Measure blood pressure post delivery every 15 min x 1 hour then every 30 min x 1 hour  . Patient may have epidural placement upon request  . Call doctor  . Full code  . Type and screen  . ABO/Rh  . Insert and maintain IV Line  . Admit to Inpatient (patient's expected length of stay will be greater than 2 midnights or inpatient only procedure)   Meds ordered this encounter  Medications  . misoprostol (CYTOTEC) tablet 25 mcg  . oxytocin (PITOCIN) IV infusion 40 units in NS 1000 mL - Premix    Order Specific Question:   Begin infusion at:    Answer:   1 milli-unit/min (1.5 mL/hr)    Order Specific Question:   Increase infusion by:    Answer:   1 milli-unit/min (1.5 mL/hr)  . terbutaline (BRETHINE) injection 0.25 mg  . lactated ringers infusion  . lactated ringers infusion 500-1,000 mL  . oxytocin (PITOCIN) IV BOLUS FROM BAG  . oxytocin (PITOCIN) IV infusion 40 units in NS 1000 mL - Premix  . acetaminophen (TYLENOL) tablet 650 mg  . fentaNYL (SUBLIMAZE) injection 50-100 mcg  . lidocaine (PF) (XYLOCAINE) 1 % injection 30 mL  . ondansetron (ZOFRAN) injection 4 mg  . sodium citrate-citric acid (ORACIT) solution 30 mL  . sodium phosphate (FLEET) 7-19 GM/118ML enema 1 enema  . methyldopa (ALDOMET) tablet 250 mg    Assessment/Plan: Diana Nelson is a 38 y.o. female, [id number], IUP at 31 weeks, presenting for IOL due to [facility] on aldomet 250mg  daily.  Baseline labs were neg and all unremarkable for preeclampsia on 09/16/2018. Pt currently denies HA, ruq pain or vision changes. Anxiety (no meds), smoker, AMA, cosanguinity (married to first cousin). EFW 6.3lbs  on 11/5 vis growth scan. BPP 8/8 on 11/12. GBS-. Baby female. Pt endorse + Fm. Denies vaginal leakage. Denies vaginal bleeding. Denies feeling cxt's.   FWB: Cat 1 Fetal Tracing.   I discussed with patient risks, benefits and alternatives of labor induction including higher risk of cesarean delivery  compared to spontaneous labor.  We discussed risks of induction agents including effects on fetal heart beat, contraction pattern and need for close monitoring.  Patient expressed understanding of all this and desired to proceed with the induction. Risks and benefits of induction were reviewed, including failure of method, prolonged labor, need for further intervention, risk of cesarean.  Patient and family verbalized understanding and denies any further questions at this time. Pt and family wish to proceed with induction process. Discussed induction options of Cytotec, foley bulb, AROM, and pitocin were reviewed as well as risks and benefits with use of each discussed.   Plan: Admit to Birthing Suite per consult with Dr Charlotta Newton Routine [facility] orders Plan to start with cytotex or foley, unless cervix ripe then will start with pitocin. Pain med/epidural prn CHTN: monitor BP, continue aldomet  daily.  Anticipate labor progression   Dale Riverside NP-C, CNM, MSN 04/05/2019, 6:25 AM

## 2019-04-04 NOTE — Progress Notes (Signed)
Labor Progress Note  Diana Nelson is a 38 y.o. female, [id number], IUP at 58 weeks, presenting for IOL due to [facility] on aldomet 250mg  daily. Baseline labs were neg and all unremarkable for preeclampsia on 09/16/2018. Pt currently denies HA, ruq pain or vision changes. Anxiety (no meds), smoker, AMA, cosanguinity (married to first cousin). EFW 6.3lbs on 11/5 vis growth scan. BPP 8/8 on 11/12. GBS-. Baby female. Pt endorse + Fm. Denies vaginal leakage. Denies vaginal bleeding. Denies feeling cxt's.  Subjective: Pt sleeping comfortable with husband in bed.  Patient Active Problem List   Diagnosis Date Noted  . Chronic hypertension complicating or reason for care during pregnancy, third trimester 04/05/2019  . Acne vulgaris 01/01/2018  . Onychomycosis 01/01/2018  . Maternal chronic hypertension in third trimester 11/01/2017  . Chronic hypertension affecting pregnancy 09/09/2016  . Advanced maternal age in multigravida, first trimester 09/07/2016  . Current every day smoker 09/07/2016  . Obesity (BMI 30-39.9) 09/07/2016  . Consanguinity 09/07/2016  . Eczema 09/07/2016  . Depression with anxiety 12/29/2015  . Attention deficit hyperactivity disorder (ADHD) 12/29/2015  . Low back pain 02/06/2015  . HTN (hypertension) 02/06/2015  . GERD 01/12/2009  . 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 Asthma 07/31/2007  . 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 Migraine headache 09/24/2006   Objective: BP 114/73   Pulse 80   Temp 98.3 F (36.8 C) (Oral)   Resp 18   Ht 5\' 2"  (1.575 m)   Wt 104.4 kg   LMP 07/13/2018   BMI 42.10 kg/m  No intake/output data recorded. No intake/output data recorded. NST: FHR baseline 145 bpm, Variability: moderate, Accelerations:present, Decelerations:  Absent= Cat 1/Reactive CTX:  irregular, every 2-6 minutes, lasting 60/80 seconds Uterus gravid, soft non tender, moderate to palpate with contractions.  SVE:  Dilation: 1 Effacement (%): Thick Station:  Ballotable Exam by:: Javier Docker RN Pitocin at (Not indicated yet) mUn/min  Assessment:  Diana Nelson is a 38 y.o. female, [id number], IUP at 66 weeks, presenting for IOL due to [facility] on aldomet 250mg  daily. Baseline labs were neg and all unremarkable for preeclampsia on 09/16/2018. Pt currently denies HA, ruq pain or vision changes. Anxiety (no meds), smoker, AMA, cosanguinity (married to first cousin). EFW 6.3lbs on 11/5 vis growth scan. BPP 8/8 on 11/12. GBS-. Baby female. Pt feels cxt once in a while, cervix not ripe on second dose of Cytotec.  Patient Active Problem List   Diagnosis Date Noted  . Chronic hypertension complicating or reason for care during pregnancy, third trimester 04/05/2019  . Acne vulgaris 01/01/2018  . Onychomycosis 01/01/2018  . Maternal chronic hypertension in third trimester 11/01/2017  . Chronic hypertension affecting pregnancy 09/09/2016  . Advanced maternal age in multigravida, first trimester 09/07/2016  . Current every day smoker 09/07/2016  . Obesity (BMI 30-39.9) 09/07/2016  . Consanguinity 09/07/2016  . Eczema 09/07/2016  . Depression with anxiety 12/29/2015  . Attention deficit hyperactivity disorder (ADHD) 12/29/2015  . Low back pain 02/06/2015  . HTN (hypertension) 02/06/2015  . GERD 01/12/2009  . 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 Asthma 07/31/2007  . 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 Migraine headache 09/24/2006   NICHD: Category 1  Membranes:  Intact, no s/s of infection  Induction:    Cytotec x0059, 0505  Pitocin - (Not indicated yet)   Pain management:               IV pain management: x 0  Epidural placement: PRN  GBS Negative  CHTN: 114/73, asymptomatic, on aldomet daily.   Plan: Continue labor plan CHTN: monitor BP, continue aldomet 250mg  daily, preeclampsia labs if indicated by elevated BP or new onset neuro symptoms.  Continuous monitoring Rest Ambulate Frequent position changes to facilitate fetal  rotation and descent. Dr will reassess with cervical exam  if necessary Continue cytotex per protocol, plan to start pitocin once cervix ripe.  Anticipate labor progression and vaginal delivery.   Md Dr Mora Appl will be made aware of plan when she assumes care at 0700.   Mora Appl, NP-C, CNM, MSN 04/05/2019. 6:27 AM

## 2019-04-05 ENCOUNTER — Other Ambulatory Visit: Payer: Self-pay

## 2019-04-05 ENCOUNTER — Inpatient Hospital Stay (HOSPITAL_COMMUNITY)
Admission: AD | Admit: 2019-04-05 | Discharge: 2019-04-07 | DRG: 807 | Disposition: A | Discharge status: Home / Self Care | Payer: Medicaid Other | Attending: Obstetrics & Gynecology | Admitting: Obstetrics & Gynecology

## 2019-04-05 ENCOUNTER — Inpatient Hospital Stay (HOSPITAL_COMMUNITY): Payer: Medicaid Other

## 2019-04-05 ENCOUNTER — Encounter (HOSPITAL_COMMUNITY): Payer: Self-pay

## 2019-04-05 DIAGNOSIS — O9962 Diseases of the digestive system complicating childbirth: Secondary | ICD-10-CM | POA: Diagnosis present

## 2019-04-05 DIAGNOSIS — O99334 Smoking (tobacco) complicating childbirth: Secondary | ICD-10-CM | POA: Diagnosis present

## 2019-04-05 DIAGNOSIS — O9952 Diseases of the respiratory system complicating childbirth: Secondary | ICD-10-CM | POA: Diagnosis present

## 2019-04-05 DIAGNOSIS — F17210 Nicotine dependence, cigarettes, uncomplicated: Secondary | ICD-10-CM | POA: Diagnosis present

## 2019-04-05 DIAGNOSIS — J45909 Unspecified asthma, uncomplicated: Secondary | ICD-10-CM | POA: Diagnosis present

## 2019-04-05 DIAGNOSIS — O99214 Obesity complicating childbirth: Secondary | ICD-10-CM | POA: Diagnosis present

## 2019-04-05 DIAGNOSIS — K219 Gastro-esophageal reflux disease without esophagitis: Secondary | ICD-10-CM | POA: Diagnosis present

## 2019-04-05 DIAGNOSIS — F410 Panic disorder [episodic paroxysmal anxiety] without agoraphobia: Secondary | ICD-10-CM | POA: Diagnosis present

## 2019-04-05 DIAGNOSIS — O1002 Pre-existing essential hypertension complicating childbirth: Principal | ICD-10-CM | POA: Diagnosis present

## 2019-04-05 DIAGNOSIS — O10013 Pre-existing essential hypertension complicating pregnancy, third trimester: Secondary | ICD-10-CM | POA: Diagnosis not present

## 2019-04-05 DIAGNOSIS — O10913 Unspecified pre-existing hypertension complicating pregnancy, third trimester: Secondary | ICD-10-CM | POA: Diagnosis present

## 2019-04-05 DIAGNOSIS — E669 Obesity, unspecified: Secondary | ICD-10-CM | POA: Diagnosis present

## 2019-04-05 DIAGNOSIS — Z3A38 38 weeks gestation of pregnancy: Secondary | ICD-10-CM

## 2019-04-05 DIAGNOSIS — O99344 Other mental disorders complicating childbirth: Secondary | ICD-10-CM | POA: Diagnosis present

## 2019-04-05 LAB — CBC
HCT: 36.3 % (ref 36.0–46.0)
Hemoglobin: 12 g/dL (ref 12.0–15.0)
MCH: 30.5 pg (ref 26.0–34.0)
MCHC: 33.1 g/dL (ref 30.0–36.0)
MCV: 92.1 fL (ref 80.0–100.0)
Platelets: 204 10*3/uL (ref 150–400)
RBC: 3.94 MIL/uL (ref 3.87–5.11)
RDW: 13.6 % (ref 11.5–15.5)
WBC: 16.4 10*3/uL — ABNORMAL HIGH (ref 4.0–10.5)
nRBC: 0 % (ref 0.0–0.2)

## 2019-04-05 LAB — RPR: RPR Ser Ql: NONREACTIVE

## 2019-04-05 LAB — TYPE AND SCREEN
ABO/RH(D): O POS
Antibody Screen: NEGATIVE

## 2019-04-05 LAB — ABO/RH: ABO/RH(D): O POS

## 2019-04-05 MED ORDER — ONDANSETRON HCL 4 MG/2ML IJ SOLN
4.0000 mg | Freq: Four times a day (QID) | INTRAMUSCULAR | Status: DC | PRN
  Administered 2019-04-05 (×2): 4 mg via INTRAVENOUS
  Filled 2019-04-05 (×2): qty 2

## 2019-04-05 MED ORDER — TERBUTALINE SULFATE 1 MG/ML IJ SOLN: 0.2500 mg | Freq: Once | INTRAMUSCULAR | Status: DC | PRN

## 2019-04-05 MED ORDER — EPHEDRINE 5 MG/ML INJ: 10.0000 mg | INTRAVENOUS | Status: DC | PRN

## 2019-04-05 MED ORDER — OXYTOCIN 40 UNITS IN NORMAL SALINE INFUSION - SIMPLE MED
2.5000 [IU]/h | INTRAVENOUS | Status: DC
  Administered 2019-04-06: 2.5 [IU]/h via INTRAVENOUS

## 2019-04-05 MED ORDER — ACETAMINOPHEN 325 MG PO TABS
650.0000 mg | ORAL_TABLET | ORAL | Status: DC | PRN
  Administered 2019-04-05 – 2019-04-06 (×2): 650 mg via ORAL
  Filled 2019-04-05 (×2): qty 2

## 2019-04-05 MED ORDER — PHENYLEPHRINE 40 MCG/ML (10ML) SYRINGE FOR IV PUSH (FOR BLOOD PRESSURE SUPPORT): 80.0000 ug | PREFILLED_SYRINGE | INTRAVENOUS | Status: DC | PRN

## 2019-04-05 MED ORDER — LACTATED RINGERS IV SOLN
INTRAVENOUS | Status: DC
  Administered 2019-04-05 (×4): via INTRAVENOUS

## 2019-04-05 MED ORDER — FENTANYL-BUPIVACAINE-NACL 0.5-0.125-0.9 MG/250ML-% EP SOLN: 12.0000 mL/h | EPIDURAL | Status: DC | PRN

## 2019-04-05 MED ORDER — LIDOCAINE HCL (PF) 1 % IJ SOLN: 30.0000 mL | INTRAMUSCULAR | Status: DC | PRN

## 2019-04-05 MED ORDER — OXYTOCIN 40 UNITS IN NORMAL SALINE INFUSION - SIMPLE MED
1.0000 m[IU]/min | INTRAVENOUS | Status: DC
  Administered 2019-04-05: 1 m[IU]/min via INTRAVENOUS
  Filled 2019-04-05: qty 1000

## 2019-04-05 MED ORDER — SOD CITRATE-CITRIC ACID 500-334 MG/5ML PO SOLN: 30.0000 mL | ORAL | Status: DC | PRN

## 2019-04-05 MED ORDER — METHYLDOPA 250 MG HALF TABLET
250.0000 mg | ORAL_TABLET | Freq: Every day | ORAL | Status: DC
  Administered 2019-04-05 – 2019-04-06 (×2): 250 mg via ORAL
  Filled 2019-04-05 (×4): qty 1

## 2019-04-05 MED ORDER — LACTATED RINGERS IV SOLN: 500.0000 mL | INTRAVENOUS | Status: DC | PRN

## 2019-04-05 MED ORDER — FLEET ENEMA 7-19 GM/118ML RE ENEM: 1.0000 | ENEMA | RECTAL | Status: DC | PRN

## 2019-04-05 MED ORDER — FENTANYL CITRATE (PF) 100 MCG/2ML IJ SOLN
50.0000 ug | INTRAMUSCULAR | Status: DC | PRN
  Administered 2019-04-05 (×4): 100 ug via INTRAVENOUS
  Administered 2019-04-05: 50 ug via INTRAVENOUS
  Administered 2019-04-06 (×2): 100 ug via INTRAVENOUS
  Filled 2019-04-05 (×7): qty 2

## 2019-04-05 MED ORDER — LACTATED RINGERS IV SOLN: 500.0000 mL | Freq: Once | INTRAVENOUS | Status: DC

## 2019-04-05 MED ORDER — DIPHENHYDRAMINE HCL 50 MG/ML IJ SOLN: 12.5000 mg | INTRAMUSCULAR | Status: DC | PRN

## 2019-04-05 MED ORDER — OXYTOCIN BOLUS FROM INFUSION
500.0000 mL | Freq: Once | INTRAVENOUS | Status: AC
  Administered 2019-04-06: 500 mL via INTRAVENOUS

## 2019-04-05 MED ORDER — MISOPROSTOL 25 MCG QUARTER TABLET
25.0000 ug | ORAL_TABLET | ORAL | Status: DC | PRN
  Administered 2019-04-05 (×2): 25 ug via VAGINAL
  Filled 2019-04-05 (×2): qty 1

## 2019-04-05 NOTE — Progress Notes (Signed)
Diana Nelson is a 38 y.o. [id number] at [redacted]w[redacted]d by LMP admitted for induction of labor due to Hypertension.  Subjective: Patient starting to feel uncomfortable  Objective: BP (!) 126/96   Pulse 97   Temp 98.2 F (36.8 C) (Oral)   Resp 18   Ht 5\' 2"  (1.575 m)   Wt 104.4 kg   LMP 07/13/2018   BMI 42.10 kg/m  No intake/output data recorded.  FHT:  FHR: 130 bpm, variability: moderate,  accelerations:  Present,  decelerations:  Absent UC:   irregular, every 3-5 minutes SVE:   Dilation: 2 Effacement (%): 60 Station: -2 Exam by:: Dr Alwyn Pea  AROM performed, clear fluid  Labs: Lab Results  Component Value Date   WBC 16.4 (H) 04/05/2019   HGB 12.0 04/05/2019   HCT 36.3 04/05/2019   MCV 92.1 04/05/2019   PLT 204 04/05/2019    Assessment / Plan: Induction of labor due to essential hypertension,  Still in early labor On 49mU of pitocin  Labor: Progressing normally Preeclampsia:  no signs or symptoms of toxicity and intake and ouput balanced Fetal Wellbeing:  Category I Pain Control:  IV pain meds I/D:  n/a Anticipated MOD:  NSVD  Jawana Nelson Diana Nelson 04/05/2019, 3:33 PM

## 2019-04-06 ENCOUNTER — Encounter (HOSPITAL_COMMUNITY): Payer: Self-pay

## 2019-04-06 MED ORDER — DIBUCAINE (PERIANAL) 1 % EX OINT
1.0000 "application " | TOPICAL_OINTMENT | CUTANEOUS | Status: DC | PRN
  Administered 2019-04-06: 1 via RECTAL
  Filled 2019-04-06: qty 28

## 2019-04-06 MED ORDER — TETANUS-DIPHTH-ACELL PERTUSSIS 5-2.5-18.5 LF-MCG/0.5 IM SUSP: 0.5000 mL | Freq: Once | INTRAMUSCULAR | Status: DC

## 2019-04-06 MED ORDER — ONDANSETRON HCL 4 MG/2ML IJ SOLN: 4.0000 mg | INTRAMUSCULAR | Status: DC | PRN

## 2019-04-06 MED ORDER — DIPHENHYDRAMINE HCL 25 MG PO CAPS: 25.0000 mg | ORAL_CAPSULE | Freq: Four times a day (QID) | ORAL | Status: DC | PRN

## 2019-04-06 MED ORDER — PRENATAL MULTIVITAMIN CH
1.0000 | ORAL_TABLET | Freq: Every day | ORAL | Status: DC
  Administered 2019-04-06 – 2019-04-07 (×2): 1 via ORAL
  Filled 2019-04-06 (×2): qty 1

## 2019-04-06 MED ORDER — COCONUT OIL OIL: 1.0000 "application " | TOPICAL_OIL | Status: DC | PRN

## 2019-04-06 MED ORDER — ONDANSETRON HCL 4 MG PO TABS: 4.0000 mg | ORAL_TABLET | ORAL | Status: DC | PRN

## 2019-04-06 MED ORDER — SIMETHICONE 80 MG PO CHEW: 80.0000 mg | CHEWABLE_TABLET | ORAL | Status: DC | PRN

## 2019-04-06 MED ORDER — WITCH HAZEL-GLYCERIN EX PADS
1.0000 "application " | MEDICATED_PAD | CUTANEOUS | Status: DC | PRN
  Administered 2019-04-06: 1 via TOPICAL

## 2019-04-06 MED ORDER — BENZOCAINE-MENTHOL 20-0.5 % EX AERO
1.0000 "application " | INHALATION_SPRAY | CUTANEOUS | Status: DC | PRN
  Administered 2019-04-06: 1 via TOPICAL
  Filled 2019-04-06: qty 56

## 2019-04-06 MED ORDER — ENOXAPARIN SODIUM 60 MG/0.6ML ~~LOC~~ SOLN: 0.5000 mg/kg | SUBCUTANEOUS | Status: DC

## 2019-04-06 MED ORDER — IBUPROFEN 600 MG PO TABS
600.0000 mg | ORAL_TABLET | Freq: Four times a day (QID) | ORAL | Status: DC
  Administered 2019-04-06 – 2019-04-07 (×6): 600 mg via ORAL
  Filled 2019-04-06 (×6): qty 1

## 2019-04-06 MED ORDER — HYDROXYZINE HCL 25 MG PO TABS
50.0000 mg | ORAL_TABLET | Freq: Three times a day (TID) | ORAL | Status: DC | PRN
  Administered 2019-04-06: 02:00:00 50 mg via ORAL
  Filled 2019-04-06: qty 1

## 2019-04-06 MED ORDER — SENNOSIDES-DOCUSATE SODIUM 8.6-50 MG PO TABS
2.0000 | ORAL_TABLET | ORAL | Status: DC
  Administered 2019-04-06: 2 via ORAL
  Filled 2019-04-06: qty 2

## 2019-04-06 MED ORDER — ACETAMINOPHEN 325 MG PO TABS
650.0000 mg | ORAL_TABLET | ORAL | Status: DC | PRN
  Administered 2019-04-06 (×3): 650 mg via ORAL
  Filled 2019-04-06 (×4): qty 2

## 2019-04-06 NOTE — Lactation Note (Signed)
This note was copied from a baby's chart. Lactation Consultation Note  Patient Name: Diana Nelson [id number] Date: 04/06/2019 Reason for consult: Initial assessment;Early term 37-38.6wks(mom aware to call with feeding cues - on the nurses light.)  Baby is 42 hour old.  LC reviewed doc flow sheets.  Per  Mom the baby recently fed for 40 mins with swallows and the feeding was comfortable. HNV , stooled several times.  Mom is an experienced breast feeder. - see doc flow sheets.  Per mom active with WIC / [place]. Will need a hand pump prior to D/C.  LC reviewed potential feeding behaviors for an early term infant and provided the  LPT ( ET ) feeding sheet and the [facility] pamphlet with phone numbers.  Mom aware to call with feeding cues.    Maternal Data Does the patient have breastfeeding experience prior to this delivery?: Yes  Feeding Feeding Type: (per mom recently fed 40 mins with swallows and comfort)  LATCH Score                   Interventions Interventions: Breast feeding basics reviewed  Lactation Tools Discussed/Used WIC Program: Yes   Consult Status Consult Status: Follow-up Date: 04/07/19 Follow-up type: In-patient    [place] 04/06/2019, 6:48 PM

## 2019-04-06 NOTE — Lactation Note (Addendum)
This note was copied from a baby's chart. Lactation Consultation Note  Patient Name: Diana Nelson [id number] Date: 04/06/2019 Reason for consult: Mother's request;Difficult latch P5, 18 hour female infant, ETI.  Infant had 5 stools but no voids yet. Infant had two large episodes of emesis ( mucous) per mom, she had fast labour with infant.   Per mom, infant latched well in L&D, breast feed twice 20 minutes each, but last feeding she felt infant had shallow latch. This will be infant's 4th time breastfeeding. LC entered room mom in middle of bed attempting to latch infant without pillow support and using scissor hand hold position with infant.  Mom was agreeable with LC suggestions, LC supported mom's back with pillows, ask mom sit in upright position, pillows placed under infant, mom latched infant on right breast using football hold position. Mom changed hand position to "U hold supporting breast, mom brought infant to breast , chin first, with nose and chin touching breast, swallows observed and infant breastfeed for 8 minutes. LC reviewed hand expression and infant was given 5 ml of colostrum by spoon. Mom will continue to work on latching infant to breast, may hand express afterwards to give infant extra volume due to being ETI infant. Mom was doing STS with infant when [facility] left room. Mom was given harmony hand pump due to not having breast pump at home. Mom shown  How to use hand pump  & how to disassemble, clean, & reassemble parts. Mom knows to call Nurse or [place] if she has any further questions, concerns or need assistance with latching infant at breast.   Maternal Data Formula Feeding for Exclusion: Yes Reason for exclusion: Mother's choice to formula and breast feed on admission Does the patient have breastfeeding experience prior to this delivery?: Yes  Feeding Feeding Type: (per mom recently fed 40 mins with swallows and comfort)  LATCH Score Latch: Grasps breast easily,  tongue down, lips flanged, rhythmical sucking.  Audible Swallowing: Spontaneous and intermittent  Type of Nipple: Everted at rest and after stimulation  Comfort (Breast/Nipple): Soft / non-tender  Hold (Positioning): Assistance needed to correctly position infant at breast and maintain latch.  LATCH Score: 9  Interventions Interventions: Support pillows;Adjust position;Position options;Expressed milk;Breast compression;Assisted with latch;Skin to skin  Lactation Tools Discussed/Used WIC Program: Yes   Consult Status Consult Status: Follow-up Date: 04/07/19 Follow-up type: In-patient    Vicente Serene 04/06/2019, 8:57 PM

## 2019-04-06 NOTE — Discharge Summary (Signed)
SVD OB Discharge Summary     Patient Name: Diana Nelson DOB: Jul 03, 1980 MRN: [id number]  Date of admission: 04/05/2019 Delivering MD: Rhea Pink B  Date of delivery: 04/06/2019 Type of delivery: SVD  Newborn Data: Sex: Baby female  Live born female  Birth Weight: 6 lb 6.6 oz (2909 g) APGAR: 7, 9  Newborn Delivery   Birth date/time: 04/06/2019 02:21:00 Delivery type: Vaginal, Spontaneous      Feeding: breast Infant being discharge to home with mother in stable condition.   Admitting diagnosis: PREG Intrauterine pregnancy: [redacted]w[redacted]d     Secondary diagnosis:  Active Problems:   Chronic hypertension complicating or reason for care during pregnancy, third trimester   SVD (11/17)   Normal postpartum course                                Complications: None                                                              Intrapartum Procedures: spontaneous vaginal delivery Postpartum Procedures: none Complications-Operative and Postpartum: 1st degree perineal laceration Augmentation: AROM, Pitocin and Cytotec   History of Present Illness: Ms. Diana Nelson is a 38 y.o. female, [id number], who presents at [redacted]w[redacted]d weeks gestation. The patient has been followed at  [facility]  Her pregnancy has been complicated by:  Patient Active Problem List   Diagnosis Date Noted  . SVD (11/17) 04/06/2019  . Normal postpartum course 04/06/2019  . Chronic hypertension complicating or reason for care during pregnancy, third trimester 04/05/2019  . Acne vulgaris 01/01/2018  . Onychomycosis 01/01/2018  . Maternal chronic hypertension in third trimester 11/01/2017  . Chronic hypertension affecting pregnancy 09/09/2016  . Advanced maternal age in multigravida, first trimester 09/07/2016  . Current every day smoker 09/07/2016  . Obesity (BMI 30-39.9) 09/07/2016  . Consanguinity 09/07/2016  . Eczema 09/07/2016  . Depression with anxiety 12/29/2015  .  Attention deficit hyperactivity disorder (ADHD) 12/29/2015  . Low back pain 02/06/2015  . HTN (hypertension) 02/06/2015  . GERD 01/12/2009  . 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 Asthma 07/31/2007  . 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 Migraine headache 09/24/2006    Hospital course:  Induction of Labor With Vaginal Delivery   38 y.o. yo [id number] at [redacted]w[redacted]d was admitted to the hospital 04/05/2019 for induction of labor.  Indication for induction: CHTN controlled on Aldomet 250mg  PO.  Patient had an uncomplicated labor course as follows: Membrane Rupture Time/Date: 3:25 PM ,04/05/2019   Intrapartum Procedures: Episiotomy: None [1]                                         Lacerations:  1st degree [2];Perineal [11]  Patient had delivery of a Viable infant.  Information for the patient's newborn:  Londynn, Sonoda Alen Blew  Delivery Method: Vaginal, Spontaneous(Filed from Delivery Summary)    04/06/2019  Details of delivery can be found in separate delivery note.  Patient had a routine postpartum course. Patient is discharged home 04/07/19. Postpartum Day # 2 : S/P NSVD due to IOL for [facility] controlled  on aldomet 250mg  po, BP been normotensive currently 1307/58. Denies HA, RUQ pain or vision changes, pt requsting to be discharged home today, pt meet discharge criteria is stable, Dr Su Hiltoberts v;erbilizes consent for early discharge. Patient up ad lib, denies syncope or dizziness. Reports consuming regular diet without issues and denies N/V. Patient reports 0 bowel movement + passing flatus.  Denies issues with urination and reports bleeding is "lighter."  Patient is breastfeeding and reports going well.  Desires undecided for postpartum contraception.  Pain is being appropriately managed with use of po meds. HGB drop from 12-10.3, stable with QBl at time of delivery to be only 200mls.   Physical exam  Vitals:   04/06/19 0933 04/06/19 1245 04/06/19 1630 04/07/19 0628  BP: (!) 107/58 114/75  (!) 113/57 107/65  Pulse:  68 78 79  Resp: 17 17 16 18   Temp: 98.8 F (37.1 C) 98.4 F (36.9 C) 98.2 F (36.8 C) 98.9 F (37.2 C)  TempSrc: Oral Oral Oral Oral  Weight:      Height:       General: alert, cooperative and no distress Lochia: appropriate Uterine Fundus: firm Perineum: Approximate, no hematomas  DVT Evaluation: No evidence of DVT seen on physical exam. Negative Homan's sign. No cords or calf tenderness. No significant calf/ankle edema.  Labs: Lab Results  Component Value Date   WBC 13.3 (H) 04/07/2019   HGB 10.3 (L) 04/07/2019   HCT 32.7 (L) 04/07/2019   MCV 93.4 04/07/2019   PLT 197 04/07/2019   CMP Latest Ref Rng & Units 04/22/2018  Glucose 70 - 99 mg/dL 161(W100(H)  BUN 6 - 20 mg/dL 18  Creatinine 9.600.44 - 4.541.00 mg/dL 0.980.67  Sodium 119135 - 147145 mmol/L 139  Potassium 3.5 - 5.1 mmol/L 3.6  Chloride 98 - 111 mmol/L 106  CO2 22 - 32 mmol/L 22  Calcium 8.9 - 10.3 mg/dL 9.2  Total Protein 6.5 - 8.1 g/dL 7.4  Total Bilirubin 0.3 - 1.2 mg/dL 0.3  Alkaline Phos 38 - 126 U/L 91  AST 15 - 41 U/L 19  ALT 0 - 44 U/L 19    Date of discharge: 04/07/2019 Discharge Diagnoses: Term Pregnancy-delivered Discharge instruction: per After Visit Summary and "Baby and Me Booklet".  After visit meds:   Activity:           unrestricted and pelvic rest Advance as tolerated. Pelvic rest for 6 weeks.  Diet:                routine Medications: PNV and aldomet  Postpartum contraception: Undecided Condition:  Pt discharge to home with baby in stable CHTN: monitor BP report if >150/100s, or HA< vision changes or RUQ pain. [facility]  Anxiety: monitor mood, think about starting SSRI if depression occurs, and report.   Meds: Allergies as of 04/07/2019      Reactions   Azithromycin Nausea Only   Lamictal [lamotrigine] Rash      Medication List    STOP taking these medications   amoxicillin-clavulanate 875-125 MG tablet Commonly known as: AUGMENTIN    clindamycin 1 % Swab Commonly known as: CLEOCIN T   HYDROcodone-acetaminophen 5-325 MG tablet Commonly known as: NORCO/VICODIN   hydrocortisone-pramoxine 2.5-1 % rectal cream Commonly known as: ANALPRAM-HC   ketoconazole 2 % cream Commonly known as: NIZORAL   methylphenidate 20 MG tablet Commonly known as: Ritalin   nystatin cream Commonly known as: MYCOSTATIN   omeprazole 40 MG capsule Commonly known  as: PRILOSEC   omeprazole-sodium bicarbonate 40-1100 MG capsule Commonly known as: ZEGERID   ondansetron 4 MG disintegrating tablet Commonly known as: Zofran ODT   polyethylene glycol 17 g packet Commonly known as: MIRALAX / GLYCOLAX   terbinafine 250 MG tablet Commonly known as: LAMISIL     TAKE these medications   albuterol 108 (90 Base) MCG/ACT inhaler Commonly known as: VENTOLIN HFA INHALE 2 PUFFS BY MOUTH EVERY 4 HOURS AS NEEDED FOR WHEEZE OR FOR SHORTNESS OF BREATH What changed: Another medication with the same name was removed. Continue taking this medication, and follow the directions you see here.   ALPRAZolam 1 MG tablet Commonly known as: XANAX TAKE 1 TABLET BY MOUTH 3 TIMES A DAY AS NEEDED FOR ANXIETY What changed: See the new instructions.   Claritin 10 MG tablet Generic drug: loratadine Take 10 mg by mouth daily.   ferrous sulfate 325 (65 FE) MG tablet Take 325 mg by mouth daily with breakfast.   fluticasone 50 MCG/ACT nasal spray Commonly known as: FLONASE Place 2 sprays into both nostrils daily. What changed:   when to take this  reasons to take this   methyldopa 250 MG tablet Commonly known as: ALDOMET Take 1 tablet (250 mg total) by mouth 2 (two) times daily. What changed: when to take this   optichamber diamond Misc Use with inhaler   PRENATAL GUMMIES/DHA & FA PO Take 1 tablet by mouth daily.   Vitamin D 50 MCG (2000 UT) Caps Take 2,000 Units by mouth daily.       Discharge Follow Up:  Follow-up [facility] Follow up.   Specialty: Obstetrics and Gynecology Why: 1 week BP check, and 6 weeks PPV Contact information: [place]. [address] [place] [id number] [place], NP-C, CNM 04/07/2019, 7:44 AM  Noralyn Pick, FNP

## 2019-04-06 NOTE — Progress Notes (Signed)
MOB was referred for history of depression/anxiety. * Referral screened out by Clinical Social Worker because none of the following criteria appear to apply: ~ History of anxiety/depression during this pregnancy, or of post-partum depression following prior delivery. ~ Diagnosis of anxiety and/or depression within last 3 years. MOB diagnosed with anxiety/depression in 2017.  OR * MOB's symptoms currently being treated with medication and/or therapy. Per further chart review, it appears that MOB may also have prescription for Xanax.     Please contact the Clinical Social Worker if needs arise, by [facility] request, or if MOB scores greater than 9/yes to question 10 on [place] Postpartum Depression Screen.     Virgie Dad Tilia Faso, MSW, LCSW [facility] [place] [facility] [place] [phone]

## 2019-04-06 NOTE — Progress Notes (Signed)
Subjective:    Expressed a desire to ambulate and change position frequently. Wireless EFM in use, pt may have freedom of movement while maintaining cont EFM.   Objective:    VS: BP 127/68   Pulse 82   Temp 98.2 F (36.8 C) (Oral)   Resp 16   Ht 5\' 2"  (1.575 m)   Wt 104.4 kg   LMP 07/13/2018   BMI 42.10 kg/m  FHR : baseline 145 / variability moderate / accelerations present / absent decelerations Toco: contractions every 3-4 per palpation/ moderate Membranes: AROM since 1525 on 04/05/19, clear  Dilation: 3 Effacement (%): 50 Cervical Position: Posterior Station: -3, -2 Presentation: Vertex Exam by:: Sia Johnny, RN Pitocin 10 mU/min  Assessment/Plan:   38 y.o. [id number] [redacted]w[redacted]d IOL for CHTN-on Aldomet 250mg  @ HS     -normotensive  Labor: Latent phase, with slow progression Preeclampsia:  no signs or symptoms of toxicity Fetal Wellbeing:  Category I Pain Control:  IV pain meds I/D:  GBS neg Anticipated MOD:  NSVD  Arrie [place] CNM, MSN 04/06/2019 12:17 AM

## 2019-04-07 LAB — CBC
HCT: 32.7 % — ABNORMAL LOW (ref 36.0–46.0)
Hemoglobin: 10.3 g/dL — ABNORMAL LOW (ref 12.0–15.0)
MCH: 29.4 pg (ref 26.0–34.0)
MCHC: 31.5 g/dL (ref 30.0–36.0)
MCV: 93.4 fL (ref 80.0–100.0)
Platelets: 197 10*3/uL (ref 150–400)
RBC: 3.5 MIL/uL — ABNORMAL LOW (ref 3.87–5.11)
RDW: 13.8 % (ref 11.5–15.5)
WBC: 13.3 10*3/uL — ABNORMAL HIGH (ref 4.0–10.5)
nRBC: 0 % (ref 0.0–0.2)

## 2019-04-07 MED ORDER — NORETHINDRONE 0.35 MG PO TABS: 1.0000 | ORAL_TABLET | Freq: Every day | ORAL | 11 refills | Status: AC

## 2019-04-07 MED ORDER — IBUPROFEN 600 MG PO TABS: 600.0000 mg | ORAL_TABLET | Freq: Four times a day (QID) | ORAL | 1 refills | Status: DC | PRN

## 2019-04-07 NOTE — Discharge Summary (Signed)
SVD OB Discharge Summary     Patient Name: Diana Nelson DOB: [birth year] MRN: [id number]  Date of admission: 04/05/2019 Delivering MD: Rhea Pink B  Date of delivery: 04/06/2019 Type of delivery: SVD  Newborn Data: Sex: Baby female  Live born female  Birth Weight: 6 lb 6.6 oz (2909 g) APGAR: 7, 9  Newborn Delivery   Birth date/time: 04/06/2019 02:21:00 Delivery type: Vaginal, Spontaneous      Feeding: breast Infant being discharge to home with mother in stable condition.   Admitting diagnosis: PREG Intrauterine pregnancy: [redacted]w[redacted]d     Secondary diagnosis:  Active Problems:   Chronic hypertension complicating or reason for care during pregnancy, third trimester   SVD (11/17)   Normal postpartum course                                Complications: None                                                              Intrapartum Procedures: spontaneous vaginal delivery Postpartum Procedures: none Complications-Operative and Postpartum: 1st degree perineal laceration Augmentation: AROM, Pitocin and Cytotec   History of Present Illness: Ms. Diana Nelson is a 38 y.o. female, [id number], who presents at [redacted]w[redacted]d weeks gestation. The patient has been followed at  [facility]  Her pregnancy has been complicated by:  Patient Active Problem List   Diagnosis Date Noted  . SVD (11/17) 04/06/2019  . Normal postpartum course 04/06/2019  . Chronic hypertension complicating or reason for care during pregnancy, third trimester 04/05/2019  . Acne vulgaris 01/01/2018  . Onychomycosis 01/01/2018  . Maternal chronic hypertension in third trimester 11/01/2017  . Chronic hypertension affecting pregnancy 09/09/2016  . Advanced maternal age in multigravida, first trimester 09/07/2016  . Current every day smoker 09/07/2016  . Obesity (BMI 30-39.9) 09/07/2016  . Consanguinity 09/07/2016  . Eczema 09/07/2016  . Depression with anxiety 12/29/2015  .  Attention deficit hyperactivity disorder (ADHD) 12/29/2015  . Low back pain 02/06/2015  . HTN (hypertension) 02/06/2015  . GERD 01/12/2009  . 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 Asthma 07/31/2007  . 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 Migraine headache 09/24/2006    Hospital course:  Induction of Labor With Vaginal Delivery   38 y.o. yo [id number] at [redacted]w[redacted]d was admitted to the hospital 04/05/2019 for induction of labor.  Indication for induction: CHTN controlled on Aldomet 250mg  PO.  Patient had an uncomplicated labor course as follows: Membrane Rupture Time/Date: 3:25 PM ,04/05/2019   Intrapartum Procedures: Episiotomy: None [1]                                         Lacerations:  1st degree [2];Perineal [11]  Patient had delivery of a Viable infant.  Information for the patient's newborn:  Dorene, Bruni Alen Blew  Delivery Method: Vaginal, Spontaneous(Filed from Delivery Summary)    04/06/2019  Details of delivery can be found in separate delivery note.  Patient had a routine postpartum course. Patient is discharged home 04/07/19. Postpartum Day # 2 : S/P NSVD due to IOL for [facility] controlled  on aldomet 250mg  po, BP been normotensive currently 1307/58. Denies HA, RUQ pain or vision changes, pt requsting to be discharged home today, pt meet discharge criteria is stable, Dr Su Hiltoberts v;erbilizes consent for early discharge. Patient up ad lib, denies syncope or dizziness. Reports consuming regular diet without issues and denies N/V. Patient reports 0 bowel movement + passing flatus.  Denies issues with urination and reports bleeding is "lighter."  Patient is breastfeeding and reports going well.  Desires undecided for postpartum contraception.  Pain is being appropriately managed with use of po meds. HGB drop from 12-10.3, stable with QBl at time of delivery to be only 200mls.   Physical exam  Vitals:   04/06/19 0933 04/06/19 1245 04/06/19 1630 04/07/19 0628  BP: (!) 107/58 114/75  (!) 113/57 107/65  Pulse:  68 78 79  Resp: 17 17 16 18   Temp: 98.8 F (37.1 C) 98.4 F (36.9 C) 98.2 F (36.8 C) 98.9 F (37.2 C)  TempSrc: Oral Oral Oral Oral  Weight:      Height:       General: alert, cooperative and no distress Lochia: appropriate Uterine Fundus: firm Perineum: Approximate, no hematomas  DVT Evaluation: No evidence of DVT seen on physical exam. Negative Homan's sign. No cords or calf tenderness. No significant calf/ankle edema.  Labs: Lab Results  Component Value Date   WBC 13.3 (H) 04/07/2019   HGB 10.3 (L) 04/07/2019   HCT 32.7 (L) 04/07/2019   MCV 93.4 04/07/2019   PLT 197 04/07/2019   CMP Latest Ref Rng & Units 04/22/2018  Glucose 70 - 99 mg/dL 161(W100(H)  BUN 6 - 20 mg/dL 18  Creatinine 9.600.44 - 4.541.00 mg/dL 0.980.67  Sodium 119135 - 147145 mmol/L 139  Potassium 3.5 - 5.1 mmol/L 3.6  Chloride 98 - 111 mmol/L 106  CO2 22 - 32 mmol/L 22  Calcium 8.9 - 10.3 mg/dL 9.2  Total Protein 6.5 - 8.1 g/dL 7.4  Total Bilirubin 0.3 - 1.2 mg/dL 0.3  Alkaline Phos 38 - 126 U/L 91  AST 15 - 41 U/L 19  ALT 0 - 44 U/L 19    Date of discharge: 04/07/2019 Discharge Diagnoses: Term Pregnancy-delivered Discharge instruction: per After Visit Summary and "Baby and Me Booklet".  After visit meds:   Activity:           unrestricted and pelvic rest Advance as tolerated. Pelvic rest for 6 weeks.  Diet:                routine Medications: PNV and aldomet  Postpartum contraception: Undecided Condition:  Pt discharge to home with baby in stable CHTN: monitor BP report if >150/100s, or HA< vision changes or RUQ pain. [facility].  Anxiety: monitor mood, think about starting SSRI if depression occurs, and report.   Meds: Allergies as of 04/07/2019      Reactions   Azithromycin Nausea Only   Lamictal [lamotrigine] Rash      Medication List    STOP taking these medications   amoxicillin-clavulanate 875-125 MG tablet Commonly known as: AUGMENTIN    clindamycin 1 % Swab Commonly known as: CLEOCIN T   HYDROcodone-acetaminophen 5-325 MG tablet Commonly known as: NORCO/VICODIN   hydrocortisone-pramoxine 2.5-1 % rectal cream Commonly known as: ANALPRAM-HC   ketoconazole 2 % cream Commonly known as: NIZORAL   methylphenidate 20 MG tablet Commonly known as: Ritalin   nystatin cream Commonly known as: MYCOSTATIN   omeprazole 40 MG capsule Commonly known  as: PRILOSEC   omeprazole-sodium bicarbonate 40-1100 MG capsule Commonly known as: ZEGERID   ondansetron 4 MG disintegrating tablet Commonly known as: Zofran ODT   polyethylene glycol 17 g packet Commonly known as: MIRALAX / GLYCOLAX   terbinafine 250 MG tablet Commonly known as: LAMISIL     TAKE these medications   albuterol 108 (90 Base) MCG/ACT inhaler Commonly known as: VENTOLIN HFA INHALE 2 PUFFS BY MOUTH EVERY 4 HOURS AS NEEDED FOR WHEEZE OR FOR SHORTNESS OF BREATH What changed: Another medication with the same name was removed. Continue taking this medication, and follow the directions you see here.   ALPRAZolam 1 MG tablet Commonly known as: XANAX TAKE 1 TABLET BY MOUTH 3 TIMES A DAY AS NEEDED FOR ANXIETY What changed: See the new instructions.   Claritin 10 MG tablet Generic drug: loratadine Take 10 mg by mouth daily.   ferrous sulfate 325 (65 FE) MG tablet Take 325 mg by mouth daily with breakfast.   fluticasone 50 MCG/ACT nasal spray Commonly known as: FLONASE Place 2 sprays into both nostrils daily. What changed:   when to take this  reasons to take this   ibuprofen 600 MG tablet Commonly known as: ADVIL Take 1 tablet (600 mg total) by mouth every 6 (six) hours as needed for moderate pain.   methyldopa 250 MG tablet Commonly known as: ALDOMET Take 1 tablet (250 mg total) by mouth 2 (two) times daily. What changed: when to take this   norethindrone 0.35 MG tablet Commonly known as: Ortho Micronor Take 1 tablet (0.35 mg total) by mouth  daily.   optichamber diamond Misc Use with inhaler   PRENATAL GUMMIES/DHA & FA PO Take 1 tablet by mouth daily.   Vitamin D 50 MCG (2000 UT) Caps Take 2,000 Units by mouth daily.       Discharge Follow Up:  Follow-up [facility] Follow up.   Specialty: Obstetrics and Gynecology Why: 1 week BP check, and 6 weeks PPV Contact information: [address] [place] [address] [place], NP-C, CNM 04/07/2019, 1:30 PM  Shamaya Kauer, Andrez Grime, MD

## 2019-04-09 ENCOUNTER — Encounter: Payer: Self-pay | Admitting: Family Medicine

## 2019-04-14 NOTE — Telephone Encounter (Signed)
I see she now has [place] and we do not participate with that program. That is why I am no longer listed as the PCP. She will either need to request her Medicaid be changed so I can still see her or else she will need to change to another PCP

## 2019-04-19 NOTE — Telephone Encounter (Signed)
Called pt to advise of update.

## 2019-04-20 ENCOUNTER — Telehealth: Payer: Self-pay

## 2019-04-20 NOTE — Telephone Encounter (Signed)
It is being taken care of by [facility] per Tillie Rung

## 2019-04-20 NOTE — Telephone Encounter (Signed)
Called pt to see if she could bring a copy of insurance card or upload it through [place].

## 2019-04-21 ENCOUNTER — Other Ambulatory Visit: Payer: Self-pay | Admitting: Family Medicine

## 2019-04-27 ENCOUNTER — Encounter: Payer: Self-pay | Admitting: Family Medicine

## 2019-04-27 ENCOUNTER — Telehealth: Payer: Self-pay | Admitting: *Deleted

## 2019-04-27 NOTE — Telephone Encounter (Signed)
Copied from [place] [phone]. Topic: General - Other >> Apr 27, 2019  8:57 AM Reyne Dumas L wrote: Reason for CRM:   See MyChart message.  Pt states that she needs to speak with Tillie Rung right away, it is very important and about medication.

## 2019-04-27 NOTE — Telephone Encounter (Signed)
Diana Nelson added back as PCP, MCD verified.   Pt called in, spoke with her and advised all has been corrected. Nothing further needed.

## 2019-04-27 NOTE — Telephone Encounter (Signed)
Called pt to advised that I see the insurance card and will have office staff update the chart.

## 2019-04-27 NOTE — Telephone Encounter (Signed)
Spoke to pt and she advise that the medication for her BP is always on back order but the pharmacy has 500 mg. Pt wanted to know if we can send in a script for 500mg  since the tablet is scored for her to take.

## 2019-04-28 MED ORDER — METHYLDOPA 500 MG PO TABS: ORAL_TABLET | ORAL | 3 refills | Status: DC

## 2019-04-28 NOTE — Telephone Encounter (Signed)
Call in Methyldopa 500 mg to take 1/2 tablet (250 mg) BID, #90 with 3 rf

## 2019-04-29 ENCOUNTER — Other Ambulatory Visit: Payer: Self-pay | Admitting: Family Medicine

## 2019-05-03 NOTE — Telephone Encounter (Signed)
Not on current med list. Ok to fill? 

## 2019-05-09 ENCOUNTER — Other Ambulatory Visit: Payer: Self-pay | Admitting: Family Medicine

## 2019-05-10 NOTE — Telephone Encounter (Signed)
Last filled 10/13/2018 Last OV 01/13/2019  Ok to fill?

## 2019-05-18 DIAGNOSIS — Z3040 Encounter for surveillance of contraceptives, unspecified: Secondary | ICD-10-CM | POA: Diagnosis not present

## 2019-05-24 ENCOUNTER — Encounter: Payer: Self-pay | Admitting: Family Medicine

## 2019-05-24 MED ORDER — RIZATRIPTAN BENZOATE 10 MG PO TABS: 10.0000 mg | ORAL_TABLET | ORAL | 11 refills | Status: DC | PRN

## 2019-05-24 NOTE — Telephone Encounter (Signed)
I sent this in

## 2019-05-24 NOTE — Telephone Encounter (Signed)
Please advise. This Rx is no longer on the medication list.

## 2019-05-31 ENCOUNTER — Encounter: Payer: Self-pay | Admitting: Family Medicine

## 2019-05-31 MED ORDER — METHYLPHENIDATE HCL 20 MG PO TABS: 20.0000 mg | ORAL_TABLET | Freq: Three times a day (TID) | ORAL | 0 refills | Status: DC

## 2019-05-31 NOTE — Telephone Encounter (Signed)
I sent it in for TID. That is the most per day that I can write for

## 2019-06-09 IMAGING — CR DG SHOULDER 2+V*L*
2 series · 2 of 2 positions shown · non-contrast
Comparison: None.

CLINICAL DATA: Worsening shoulder pain over the past 45 weeks. May
have injured lifting boxes at work.

EXAM:
LEFT SHOULDER - 2+ VIEW

[w shoulder external left]
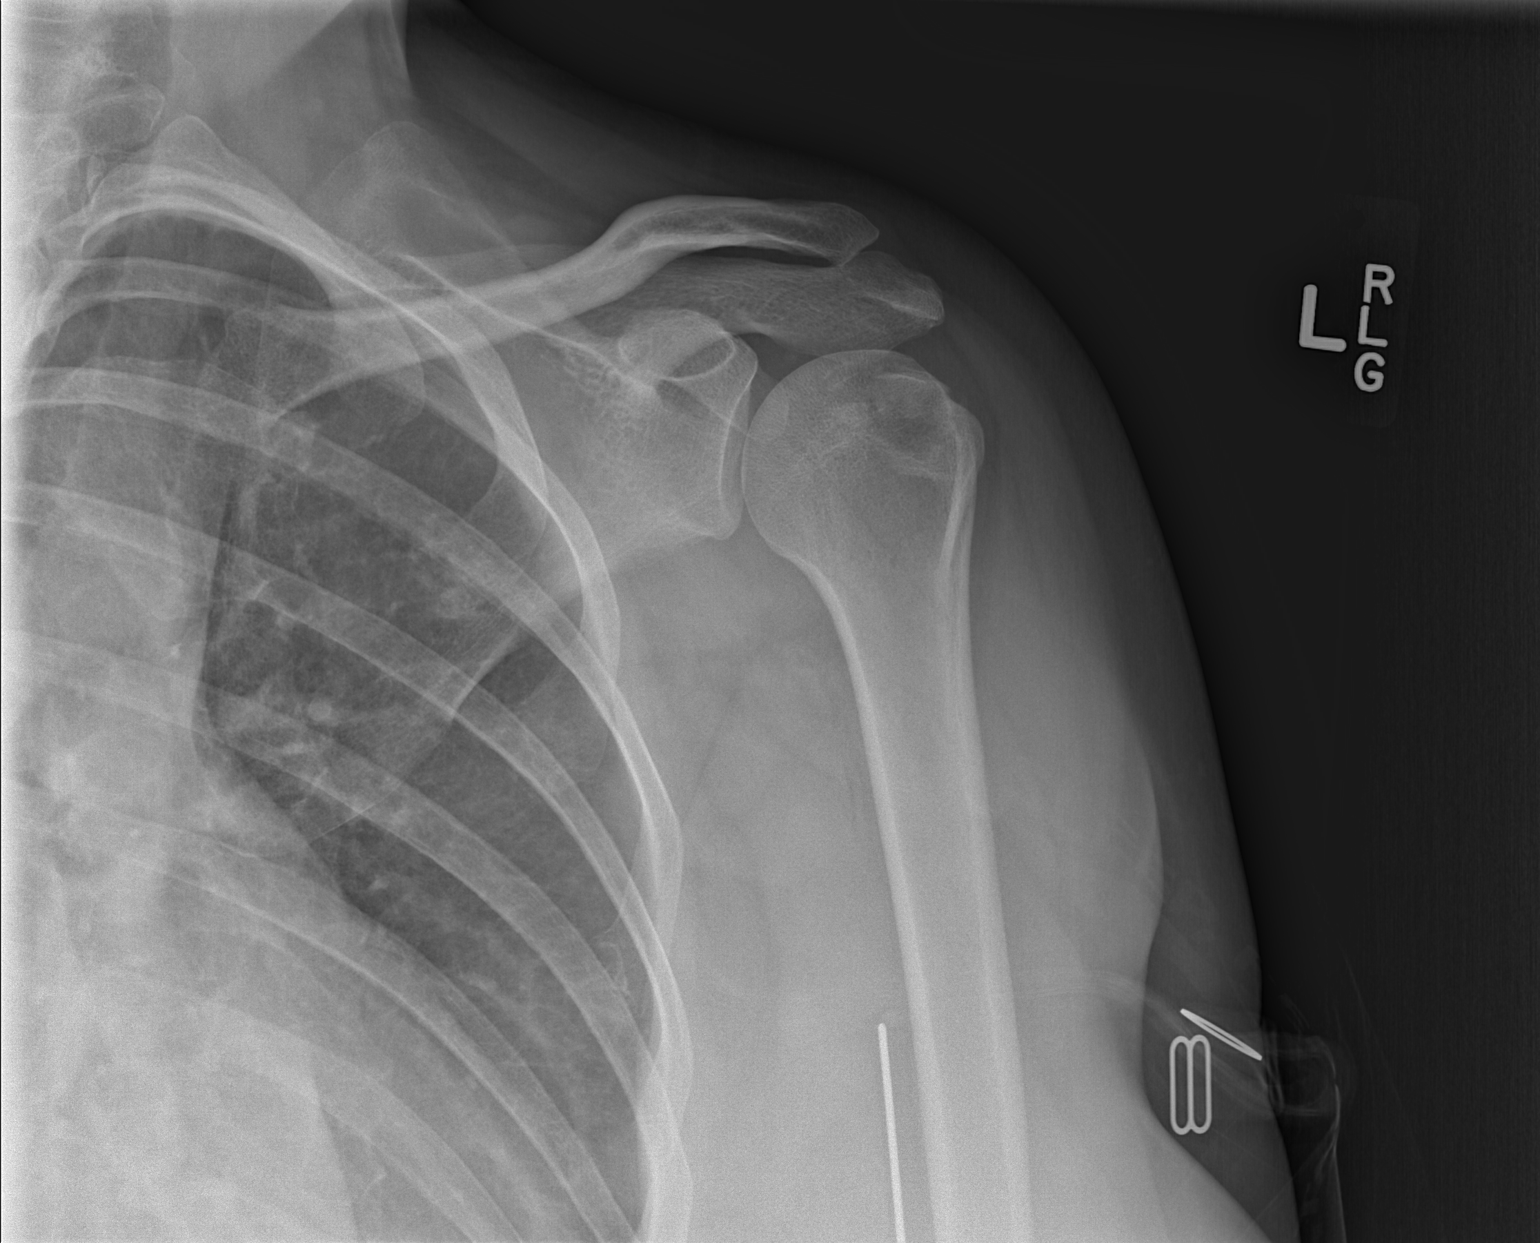

[w shoulder y-view left]
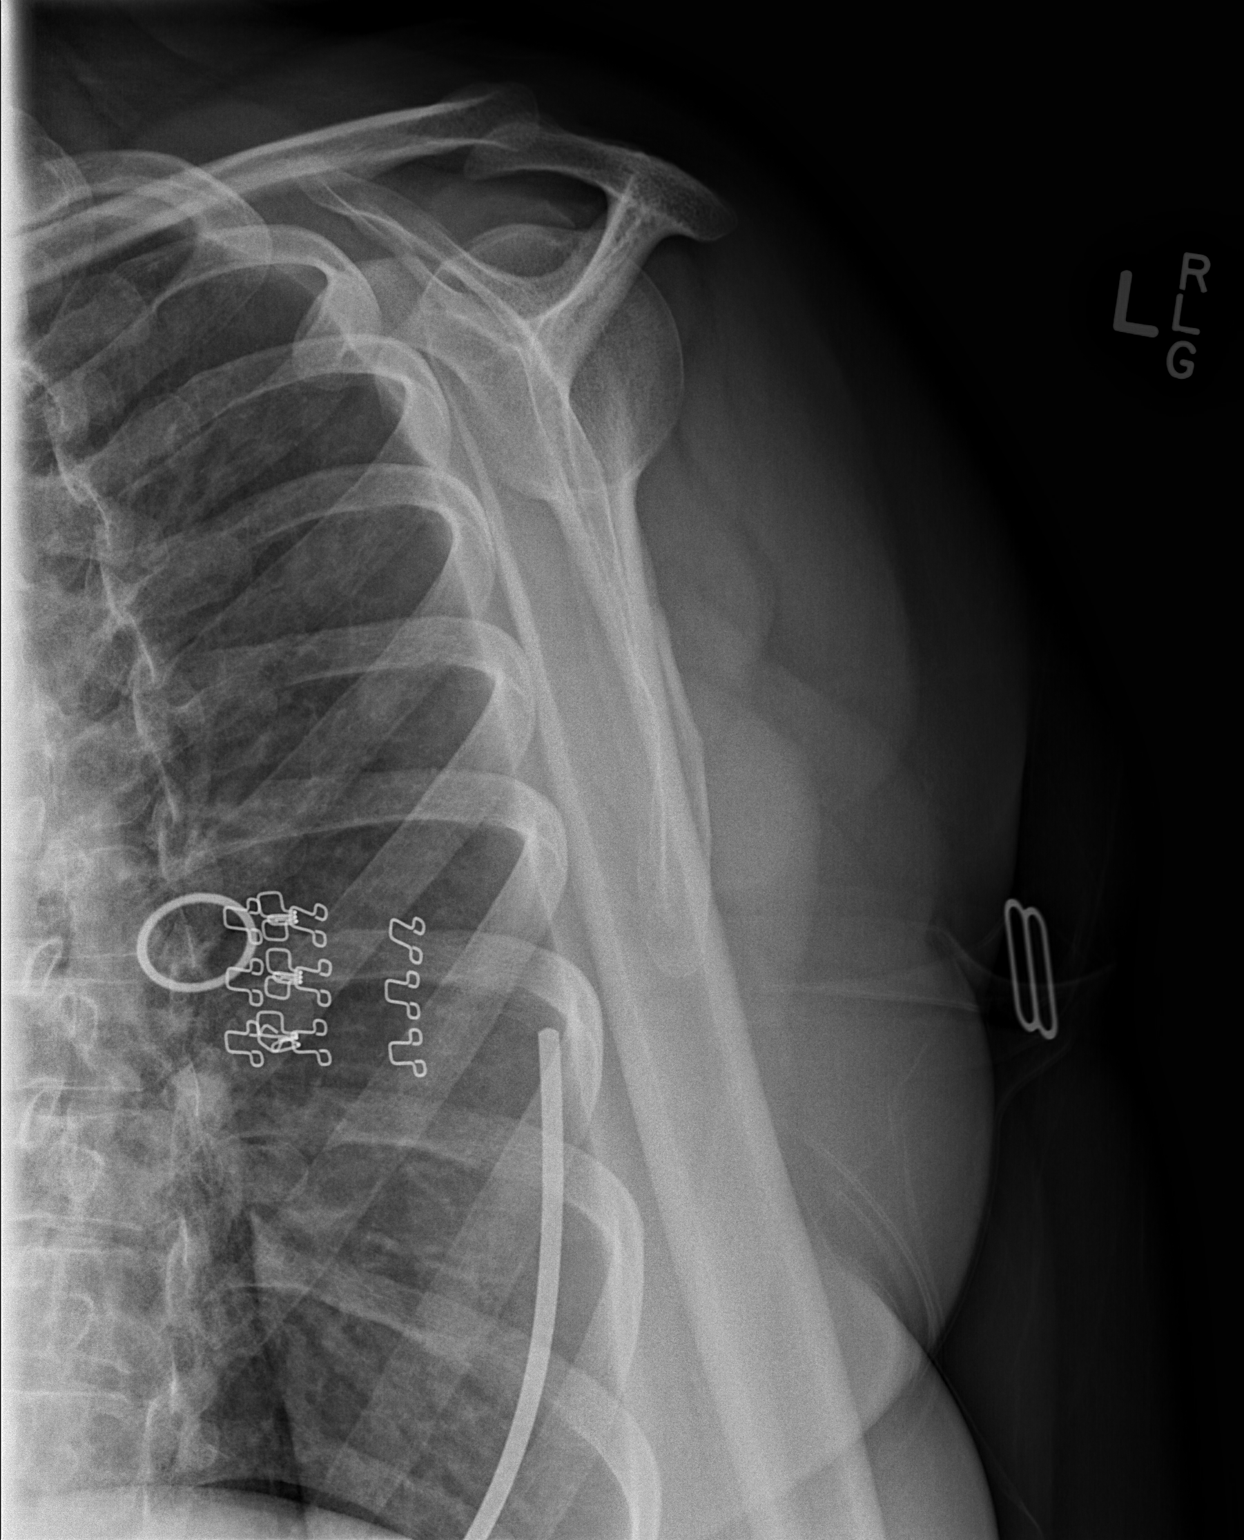

[2 of 2 positions shown; findings below may reference images not displayed]

FINDINGS: Abnormal appearance of the humeral head. Possible subchondral
fracture or osteonecrosis. No dislocation. The CT or MRI may be
helpful for further evaluation. The visualized left lung is clear.
IMPRESSION: Possible subchondral fracture, osteonecrosis or AVN. CT or MRI may
be helpful for further evaluation.

## 2019-06-13 ENCOUNTER — Emergency Department (HOSPITAL_COMMUNITY): Payer: Medicaid Other

## 2019-06-13 ENCOUNTER — Emergency Department (HOSPITAL_COMMUNITY)
Admission: EM | Admit: 2019-06-13 | Discharge: 2019-06-14 | Disposition: A | Discharge status: Home / Self Care | Payer: Medicaid Other | Attending: Emergency Medicine | Admitting: Emergency Medicine

## 2019-06-13 ENCOUNTER — Telehealth: Payer: Self-pay | Admitting: Surgery

## 2019-06-13 DIAGNOSIS — I10 Essential (primary) hypertension: Secondary | ICD-10-CM | POA: Diagnosis not present

## 2019-06-13 DIAGNOSIS — S022XXA Fracture of nasal bones, initial encounter for closed fracture: Secondary | ICD-10-CM

## 2019-06-13 DIAGNOSIS — S0992XA Unspecified injury of nose, initial encounter: Secondary | ICD-10-CM | POA: Diagnosis present

## 2019-06-13 DIAGNOSIS — T424X4A Poisoning by benzodiazepines, undetermined, initial encounter: Secondary | ICD-10-CM

## 2019-06-13 DIAGNOSIS — F17210 Nicotine dependence, cigarettes, uncomplicated: Secondary | ICD-10-CM | POA: Diagnosis not present

## 2019-06-13 DIAGNOSIS — S3991XA Unspecified injury of abdomen, initial encounter: Secondary | ICD-10-CM | POA: Diagnosis not present

## 2019-06-13 DIAGNOSIS — R404 Transient alteration of awareness: Secondary | ICD-10-CM | POA: Diagnosis not present

## 2019-06-13 DIAGNOSIS — J45909 Unspecified asthma, uncomplicated: Secondary | ICD-10-CM | POA: Insufficient documentation

## 2019-06-13 DIAGNOSIS — S3993XA Unspecified injury of pelvis, initial encounter: Secondary | ICD-10-CM | POA: Diagnosis not present

## 2019-06-13 DIAGNOSIS — Y92410 Unspecified street and highway as the place of occurrence of the external cause: Secondary | ICD-10-CM | POA: Diagnosis not present

## 2019-06-13 DIAGNOSIS — R531 Weakness: Secondary | ICD-10-CM | POA: Diagnosis not present

## 2019-06-13 DIAGNOSIS — S0083XA Contusion of other part of head, initial encounter: Secondary | ICD-10-CM | POA: Diagnosis not present

## 2019-06-13 DIAGNOSIS — Y999 Unspecified external cause status: Secondary | ICD-10-CM | POA: Diagnosis not present

## 2019-06-13 DIAGNOSIS — R609 Edema, unspecified: Secondary | ICD-10-CM | POA: Diagnosis not present

## 2019-06-13 DIAGNOSIS — R4182 Altered mental status, unspecified: Secondary | ICD-10-CM | POA: Diagnosis not present

## 2019-06-13 DIAGNOSIS — S199XXA Unspecified injury of neck, initial encounter: Secondary | ICD-10-CM | POA: Diagnosis not present

## 2019-06-13 DIAGNOSIS — S299XXA Unspecified injury of thorax, initial encounter: Secondary | ICD-10-CM | POA: Diagnosis not present

## 2019-06-13 DIAGNOSIS — Y9389 Activity, other specified: Secondary | ICD-10-CM | POA: Diagnosis not present

## 2019-06-13 DIAGNOSIS — K8020 Calculus of gallbladder without cholecystitis without obstruction: Secondary | ICD-10-CM | POA: Diagnosis not present

## 2019-06-13 DIAGNOSIS — S301XXA Contusion of abdominal wall, initial encounter: Secondary | ICD-10-CM | POA: Diagnosis not present

## 2019-06-13 LAB — COMPREHENSIVE METABOLIC PANEL
ALT: 65 U/L — ABNORMAL HIGH (ref 0–44)
AST: 62 U/L — ABNORMAL HIGH (ref 15–41)
Albumin: 4.4 g/dL (ref 3.5–5.0)
Alkaline Phosphatase: 97 U/L (ref 38–126)
Anion gap: 12 (ref 5–15)
BUN: 22 mg/dL — ABNORMAL HIGH (ref 6–20)
CO2: 22 mmol/L (ref 22–32)
Calcium: 9.6 mg/dL (ref 8.9–10.3)
Chloride: 105 mmol/L (ref 98–111)
Creatinine, Ser: 0.83 mg/dL (ref 0.44–1.00)
GFR calc Af Amer: >60 mL/min (ref >60)
GFR calc non Af Amer: >60 mL/min (ref >60)
Glucose, Bld: 89 mg/dL (ref 70–99)
Potassium: 3.6 mmol/L (ref 3.5–5.1)
Sodium: 139 mmol/L (ref 135–145)
Total Bilirubin: 0.3 mg/dL (ref 0.3–1.2)
Total Protein: 7.1 g/dL (ref 6.5–8.1)

## 2019-06-13 LAB — ETHANOL: Alcohol, Ethyl (B): <10 mg/dL (ref <10)

## 2019-06-13 LAB — I-STAT BETA HCG BLOOD, ED (MC, WL, AP ONLY): I-stat hCG, quantitative: <5 m[IU]/mL (ref <5)

## 2019-06-13 LAB — CBC
HCT: 39.5 % (ref 36.0–46.0)
Hemoglobin: 12.5 g/dL (ref 12.0–15.0)
MCH: 29.1 pg (ref 26.0–34.0)
MCHC: 31.6 g/dL (ref 30.0–36.0)
MCV: 92.1 fL (ref 80.0–100.0)
Platelets: 267 10*3/uL (ref 150–400)
RBC: 4.29 MIL/uL (ref 3.87–5.11)
RDW: 13.6 % (ref 11.5–15.5)
WBC: 16.2 10*3/uL — ABNORMAL HIGH (ref 4.0–10.5)
nRBC: 0 % (ref 0.0–0.2)

## 2019-06-13 LAB — LACTIC ACID, PLASMA: Lactic Acid, Venous: 1.1 mmol/L (ref 0.5–1.9)

## 2019-06-13 MED ORDER — SODIUM CHLORIDE 0.9 % IV BOLUS
1000.0000 mL | Freq: Once | INTRAVENOUS | Status: AC
  Administered 2019-06-13: 04:00:00 1000 mL via INTRAVENOUS

## 2019-06-13 MED ORDER — IBUPROFEN 400 MG PO TABS
600.0000 mg | ORAL_TABLET | Freq: Once | ORAL | Status: AC
  Administered 2019-06-13: 23:00:00 600 mg via ORAL
  Filled 2019-06-13: qty 1

## 2019-06-13 MED ORDER — ACETAMINOPHEN 500 MG PO TABS
1000.0000 mg | ORAL_TABLET | Freq: Once | ORAL | Status: AC
  Administered 2019-06-13: 23:00:00 1000 mg via ORAL
  Filled 2019-06-13: qty 2

## 2019-06-13 MED ORDER — IOHEXOL 300 MG/ML  SOLN
100.0000 mL | Freq: Once | INTRAMUSCULAR | Status: AC | PRN
  Administered 2019-06-13: 100 mL via INTRAVENOUS

## 2019-06-13 NOTE — ED Notes (Signed)
Phone provided to patient for transport arrangement.

## 2019-06-13 NOTE — ED Notes (Signed)
Pt provided with PO fluids (ice water)

## 2019-06-13 NOTE — ED Notes (Signed)
Patient awake and talking to PD officer at this time

## 2019-06-13 NOTE — ED Notes (Deleted)
Update pts brother Barbara Cower as needed, number is in the pt contacts

## 2019-06-13 NOTE — ED Notes (Signed)
This tech moved pt to be discharged, accompanied by security. Pt was agitated and refused to leave without her kids. Pt got out of wheelchair at lobby refusing to leave. Left pt with security at lobby.

## 2019-06-13 NOTE — ED Notes (Signed)
Update pts brother Leotis Shames as needed, number is in the pt contacts

## 2019-06-13 NOTE — Discharge Instructions (Addendum)
You were evaluated in the Emergency Department and after careful evaluation, we did not find any emergent condition requiring admission or further testing in the hospital. If you ever feel unsafe for yourself or your children please call the police or come the emergency room. Use ice, Tylenol and ibuprofen as needed for pain. Your exam/testing today is overall reassuring.  You have a broken nose.  Please follow-up with ENT for further evaluation.  Please return to the Emergency Department if you experience any worsening of your condition.  We encourage you to follow up with a primary care provider.  Thank you for allowing [place] to be a part of your care.

## 2019-06-13 NOTE — ED Provider Notes (Signed)
Pt has been observed all shift.  She has been asleep all shift.  I have tried to wake her up several times.  She wakes up for a minute, then falls right back asleep.  Pt will be signed out to Dr. Jodi Mourning at shift change.   Jacalyn Lefevre, MD 06/13/19 1538

## 2019-06-13 NOTE — ED Notes (Signed)
This RN attempted to discharge this pt; unable to awake pt and properly discharge; will reassess at a later time.
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
## 2019-06-13 NOTE — ED Notes (Signed)
This RN re-attempted to discharge the pt but pt was too lethargic to receive discharge instructions properly. Pt is stable and ED Provider was made aware.

## 2019-06-13 NOTE — ED Provider Notes (Addendum)
Patient CARE signed out to continue to monitor and reassess once patient more awake.  Patient woke up on her own and requesting to leave as she wants to go see her children.  Patient clinically has capacity make decisions.  Patient admits to taking Xanax because she was stressed but had no intention of self-harm.  Patient has a safe place to go for self and her children.  Patient requested something for pain for which are Tylenol and ibuprofen.  Discussed patient's nasal bone fracture on the CT scan and outpatient follow-up along with reasons to return.  Patient back to sleeping and not cooperative.  Plan for further observation until stable for discharge.  Patient observed further in the emergency room.  Social work consult however off-hours, Diplomatic Services operational officer trying to get a hold of social work.  Discussed with pediatric emergency physician and plan to observe the kids in the hospital on the pediatric floor until details worked out to ensure children safe to go home and have adequate caregivers.  On reassessment mother awake standing, alert, continues to deny thoughts of self-harm and feels safe going home.  Nursing staff working on trying to get her a ride home safely.  Kenton Kingfisher, MD 06/13/19 1647    Blane Ohara, MD 06/13/19 2257
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
## 2019-06-13 NOTE — Telephone Encounter (Signed)
ED CM received call from [facility] EMS line concerning awaiting call from CSW  to dispatch [employer] to accompany patient and her family in her  home to collect other child and belongings.  CM spoke with ED RN and was informed that patient appears drowsy and family is not present at this time.
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
## 2019-06-14 NOTE — ED Notes (Signed)
Pt left the ED with a strong and steady gait. No distress noted. Pt escorted out by security. Pt conscious, breathing, and A&Ox4. All belongings with pt.

## 2019-06-17 ENCOUNTER — Other Ambulatory Visit: Payer: Self-pay | Admitting: Family Medicine

## 2019-06-17 ENCOUNTER — Encounter: Payer: Self-pay | Admitting: Family Medicine

## 2019-06-17 NOTE — Telephone Encounter (Signed)
Please advise. Rx is not on the current med list 

## 2019-06-17 NOTE — Telephone Encounter (Signed)
Set up a virtual OV tomorrow so we can talk about this

## 2019-06-18 ENCOUNTER — Other Ambulatory Visit: Payer: Self-pay

## 2019-06-18 ENCOUNTER — Telehealth (INDEPENDENT_AMBULATORY_CARE_PROVIDER_SITE_OTHER): Payer: Medicaid Other | Admitting: Family Medicine

## 2019-06-18 DIAGNOSIS — T424X1D Poisoning by benzodiazepines, accidental (unintentional), subsequent encounter: Secondary | ICD-10-CM | POA: Diagnosis not present

## 2019-06-18 DIAGNOSIS — S022XXD Fracture of nasal bones, subsequent encounter for fracture with routine healing: Secondary | ICD-10-CM | POA: Diagnosis not present
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
## 2019-07-15 ENCOUNTER — Other Ambulatory Visit: Payer: Self-pay | Admitting: Family Medicine

## 2019-08-20 ENCOUNTER — Ambulatory Visit: Payer: Medicaid Other | Attending: Internal Medicine

## 2019-08-20 ENCOUNTER — Other Ambulatory Visit: Payer: Self-pay | Admitting: Family Medicine

## 2019-08-20 DIAGNOSIS — Z23 Encounter for immunization: Secondary | ICD-10-CM

## 2019-08-20 NOTE — Progress Notes (Signed)
   Covid-19 Vaccination Clinic  Name:  ALIEGHA PAULLIN    MRN: [id number] DOB: 01-18-1981  08/20/2019  Ms. D'Amato was observed post Covid-19 immunization for 15 minutes without incident. She was provided with Vaccine Information Sheet and instruction to access the V-Safe system.   Ms. Magda Bernheim was instructed to call 911 with any severe reactions post vaccine: Marland Kitchen Difficulty breathing  . Swelling of face and throat  . A fast heartbeat  . A bad rash all over body  . Dizziness and weakness   Immunizations Administered    Name Date Dose VIS Date Route   Pfizer COVID-19 Vaccine 08/20/2019  9:16 AM 0.3 mL 04/30/2019 Intramuscular   Manufacturer: [employer], [employer]   Lot: [id number]   NDC: [address]-7

## 2019-08-24 ENCOUNTER — Other Ambulatory Visit: Payer: Self-pay | Admitting: Family Medicine

## 2019-08-25 NOTE — Telephone Encounter (Signed)
Last filled 07/29/2019 Last OV 06/18/2019 (acute) Ok to fill?

## 2019-08-26 MED ORDER — METHYLPHENIDATE HCL 20 MG PO TABS: 20.0000 mg | ORAL_TABLET | Freq: Three times a day (TID) | ORAL | 0 refills | Status: DC

## 2019-08-26 NOTE — Telephone Encounter (Signed)
Done

## 2019-09-15 ENCOUNTER — Ambulatory Visit: Payer: Medicaid Other | Attending: Internal Medicine

## 2019-09-15 DIAGNOSIS — Z23 Encounter for immunization: Secondary | ICD-10-CM

## 2019-09-15 NOTE — Progress Notes (Signed)
   Covid-19 Vaccination Clinic  Name:  Diana Nelson    MRN: [id number] DOB: [birth year]  09/15/2019  Diana Nelson was observed post Covid-19 immunization for 15 minutes without incident. She was provided with Vaccine Information Sheet and instruction to access the V-Safe system.   Diana Nelson was instructed to call 911 with any severe reactions post vaccine: Marland Kitchen Difficulty breathing  . Swelling of face and throat  . A fast heartbeat  . A bad rash all over body  . Dizziness and weakness   Immunizations Administered    Name Date Dose VIS Date Route   Pfizer COVID-19 Vaccine 09/15/2019  8:23 AM 0.3 mL 07/14/2018 Intramuscular   Manufacturer: [employer], [employer]   Lot: [id number]   NDC: [address]-1

## 2019-10-05 ENCOUNTER — Encounter: Payer: Self-pay | Admitting: Family Medicine

## 2019-10-05 ENCOUNTER — Telehealth (INDEPENDENT_AMBULATORY_CARE_PROVIDER_SITE_OTHER): Payer: Medicaid Other | Admitting: Family Medicine

## 2019-10-05 DIAGNOSIS — J019 Acute sinusitis, unspecified: Secondary | ICD-10-CM

## 2019-10-05 MED ORDER — AMOXICILLIN-POT CLAVULANATE 875-125 MG PO TABS: 1.0000 | ORAL_TABLET | Freq: Two times a day (BID) | ORAL | 0 refills | Status: DC

## 2019-10-05 NOTE — Progress Notes (Signed)
Subjective:    Patient ID: Diana Nelson, female    DOB: 12-Dec-1980, 39 y.o.   MRN: [id number]  HPI Virtual Visit via Video Note  I connected with the patient on 10/05/19 at  3:15 PM EDT by a video enabled telemedicine application and verified that I am speaking with the correct person using two identifiers.  Location patient: home Location provider:work or home office Persons participating in the virtual visit: patient, provider  I discussed the limitations of evaluation and management by telemedicine and the availability of in person appointments. The patient expressed understanding and agreed to proceed.   HPI: Here for a week of sinus pressure, PND, and a dry cough. No fever. Using Mucinex. She has received both doses of Covid vaccine.    ROS: See pertinent positives and negatives per HPI.  Past Medical History:  Diagnosis Date  . ADHD   . Anemia   . Anxiety    on xanax  . Asthma    uses once or twice a day   . 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 Eczema   . GERD (gastroesophageal reflux disease)   . Hypertension   . Migraines   . PONV (postoperative nausea and vomiting)     Past Surgical History:  Procedure Laterality Date  . knee surgeries     x3  . SPINAL FUSION     2006   . TONSILLECTOMY      Family History  Problem Relation Age of Onset  . Birth defects Daughter        pectus excavatum surgery at age 36  . Diabetes Mother      Current Outpatient Medications:  .  albuterol (VENTOLIN HFA) 108 (90 Base) MCG/ACT inhaler, INHALE 2 PUFFS BY MOUTH EVERY 4 HOURS AS NEEDED FOR WHEEZE OR FOR SHORTNESS OF BREATH, Disp: 8.5 g, Rfl: 2 .  ALPRAZolam (XANAX) 1 MG tablet, TAKE 1 TABLET BY MOUTH 3 TIMES A DAY AS NEEDED FOR ANXIETY, Disp: 90 tablet, Rfl: 5 .  Cholecalciferol (VITAMIN D) 50 MCG (2000 UT) CAPS, Take 2,000 Units by mouth daily., Disp: , Rfl:  .  ferrous sulfate 325 (65 FE) MG tablet, Take 325 mg by mouth daily with breakfast., Disp: , Rfl:  .   fluconazole (DIFLUCAN) 150 MG tablet, TAKE 1 TABLET BY MOUTH AS ONE DOSE, Disp: 1 tablet, Rfl: 5 .  fluticasone (FLONASE) 50 MCG/ACT nasal spray, Place 2 sprays into both nostrils daily. (Patient taking differently: Place 2 sprays into both nostrils daily as needed for allergies. ), Disp: 16 g, Rfl: 6 .  ibuprofen (ADVIL) 600 MG tablet, Take 1 tablet (600 mg total) by mouth every 6 (six) hours as needed for moderate pain., Disp: 45 tablet, Rfl: 1 .  loratadine (CLARITIN) 10 MG tablet, Take 10 mg by mouth daily. , Disp: , Rfl:  .  methyldopa (ALDOMET) 500 MG tablet, TAKE 1/2 TABLET BID, Disp: 90 tablet, Rfl: 3 .  [START ON 10/26/2019] methylphenidate (RITALIN) 20 MG tablet, Take 1 tablet (20 mg total) by mouth 3 (three) times daily with meals., Disp: 90 tablet, Rfl: 0 .  norethindrone (ORTHO MICRONOR) 0.35 MG tablet, Take 1 tablet (0.35 mg total) by mouth daily., Disp: 1 Package, Rfl: 11 .  omeprazole (PRILOSEC) 40 MG capsule, TAKE 1 CAPSULE BY MOUTH TWICE A DAY, Disp: 60 capsule, Rfl: 11 .  Prenatal MV-Min-FA-Omega-3 (PRENATAL GUMMIES/DHA & FA PO), Take 1 tablet by mouth daily., Disp: , Rfl:  .  rizatriptan (MAXALT)  10 MG tablet, Take 1 tablet (10 mg total) by mouth as needed for migraine. May repeat in 2 hours if needed, Disp: 10 tablet, Rfl: 11 .  Spacer/Aero-Holding Chambers [facility] DIAMOND) MISC, Use with inhaler, Disp: 1 each, Rfl: 1  EXAM:  VITALS per patient if applicable:  GENERAL: alert, oriented, appears well and in no acute distress  HEENT: atraumatic, conjunttiva clear, no obvious abnormalities on inspection of external nose and ears  NECK: normal movements of the head and neck  LUNGS: on inspection no signs of respiratory distress, breathing rate appears normal, no obvious gross SOB, gasping or wheezing  CV: no obvious cyanosis  MS: moves all visible extremities without noticeable abnormality  PSYCH/NEURO: pleasant and cooperative, no obvious depression or anxiety, speech  and thought processing grossly intact  ASSESSMENT AND PLAN: Sinusitis, treat with Augmentin.  Gershon Crane, MD  Discussed the following assessment and plan:  No diagnosis found.     I discussed the assessment and treatment plan with the patient. The patient was provided an opportunity to ask questions and all were answered. The patient agreed with the plan and demonstrated an understanding of the instructions.   The patient was advised to call back or seek an in-person evaluation if the symptoms worsen or if the condition fails to improve as anticipated.     Review of Systems     Objective:   Physical Exam        Assessment & Plan:

## 2019-10-06 ENCOUNTER — Encounter: Payer: Self-pay | Admitting: Family Medicine

## 2019-10-06 NOTE — Telephone Encounter (Signed)
Please send her a new note

## 2019-10-13 ENCOUNTER — Other Ambulatory Visit: Payer: Self-pay | Admitting: Family Medicine

## 2019-10-13 ENCOUNTER — Encounter: Payer: Self-pay | Admitting: Family Medicine

## 2019-10-16 ENCOUNTER — Other Ambulatory Visit: Payer: Self-pay

## 2019-10-16 ENCOUNTER — Encounter (HOSPITAL_COMMUNITY): Payer: Self-pay | Admitting: Emergency Medicine

## 2019-10-16 ENCOUNTER — Ambulatory Visit (HOSPITAL_COMMUNITY)
Admission: EM | Admit: 2019-10-16 | Discharge: 2019-10-16 | Disposition: A | Discharge status: Home / Self Care | Payer: Medicaid Other | Attending: Family Medicine | Admitting: Family Medicine

## 2019-10-16 DIAGNOSIS — L03031 Cellulitis of right toe: Secondary | ICD-10-CM

## 2019-10-16 MED ORDER — SULFAMETHOXAZOLE-TRIMETHOPRIM 800-160 MG PO TABS: 1.0000 | ORAL_TABLET | Freq: Two times a day (BID) | ORAL | 0 refills | Status: DC

## 2019-10-16 MED ORDER — TRAMADOL HCL 50 MG PO TABS: 50.0000 mg | ORAL_TABLET | Freq: Four times a day (QID) | ORAL | 0 refills | Status: DC | PRN

## 2019-10-16 NOTE — ED Triage Notes (Signed)
Pt c/o right great toe infection. Pt states she had an ingrown toe nail and thought she took care of it at home. Pt states she recently had a sinus infection and was given augmentin. Patient believes this is a fungal infection and it is spreading to the other toes.

## 2019-10-16 NOTE — Discharge Instructions (Signed)
You have a bacterial infection at your toes.  I have sent in Bactrim for you to take for this to your pharmacy.  I have also sent in Tramadol for you for pain.  Follow up with primary care next week or as needed.

## 2019-10-19 ENCOUNTER — Encounter: Payer: Self-pay | Admitting: Family Medicine

## 2019-10-19 NOTE — ED Provider Notes (Signed)
[place]   [id number] 10/16/19 Arrival Time: 7062  CC: RASH  SUBJECTIVE:  Diana Nelson is a 39 y.o. female who presents with a skin complaint that began about 1 week ago. Reports that she has an ingrown toe nail on each of her great toes. Denies precipitating event or trauma. Denies changes in soaps, detergents, close contacts with similar rash, known trigger or environmental trigger, allergy. Denies medications change or starting a new medication recently.  Describes it as painful and red. Has tried to remove the ingrown area of the toenail without success.  Symptoms are made worse with walking and wearing closed toe shoes. Denies fever, chills, nausea, vomiting, erythema, swelling, discharge, oral lesions, SOB, chest pain, abdominal pain, changes in bowel or bladder function.    ROS: As per HPI.  All other pertinent ROS negative.     Past Medical History:  Diagnosis Date  . ADHD   . Anemia   . Anxiety    on xanax  . Asthma    uses once or twice a day   . 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 Eczema   . GERD (gastroesophageal reflux disease)   . Hypertension   . Migraines   . PONV (postoperative nausea and vomiting)    Past Surgical History:  Procedure Laterality Date  . knee surgeries     x3  . SPINAL FUSION     2006   . TONSILLECTOMY     Allergies  Allergen Reactions  . Azithromycin Nausea Only  . Lamictal [Lamotrigine] Rash   No current facility-administered medications on file prior to encounter.   Current Outpatient Medications on File Prior to Encounter  Medication Sig Dispense Refill  . albuterol (VENTOLIN HFA) 108 (90 Base) MCG/ACT inhaler INHALE 2 PUFFS BY MOUTH EVERY 4 HOURS AS NEEDED FOR WHEEZE OR FOR SHORTNESS OF BREATH 8.5 g 2  . ALPRAZolam (XANAX) 1 MG tablet TAKE 1 TABLET BY MOUTH 3 TIMES A DAY AS NEEDED FOR ANXIETY 90 tablet 5  . amoxicillin-clavulanate (AUGMENTIN) 875-125 MG tablet Take 1 tablet by mouth 2 (two) times daily. 20 tablet 0    . Cholecalciferol (VITAMIN D) 50 MCG (2000 UT) CAPS Take 2,000 Units by mouth daily.    . ferrous sulfate 325 (65 FE) MG tablet Take 325 mg by mouth daily with breakfast.    . fluconazole (DIFLUCAN) 150 MG tablet TAKE 1 TABLET BY MOUTH AS ONE DOSE 1 tablet 5  . fluticasone (FLONASE) 50 MCG/ACT nasal spray Place 2 sprays into both nostrils daily. (Patient taking differently: Place 2 sprays into both nostrils daily as needed for allergies. ) 16 g 6  . ibuprofen (ADVIL) 600 MG tablet Take 1 tablet (600 mg total) by mouth every 6 (six) hours as needed for moderate pain. 45 tablet 1  . loratadine (CLARITIN) 10 MG tablet Take 10 mg by mouth daily.     . methyldopa (ALDOMET) 500 MG tablet TAKE 1/2 TABLET BID 90 tablet 3  . [START ON 10/26/2019] methylphenidate (RITALIN) 20 MG tablet Take 1 tablet (20 mg total) by mouth 3 (three) times daily with meals. 90 tablet 0  . norethindrone (ORTHO MICRONOR) 0.35 MG tablet Take 1 tablet (0.35 mg total) by mouth daily. 1 Package 11  . omeprazole (PRILOSEC) 40 MG capsule TAKE 1 CAPSULE BY MOUTH TWICE A DAY 60 capsule 11  . Prenatal MV-Min-FA-Omega-3 (PRENATAL GUMMIES/DHA & FA PO) Take 1 tablet by mouth daily.    . rizatriptan (MAXALT) 10  MG tablet Take 1 tablet (10 mg total) by mouth as needed for migraine. May repeat in 2 hours if needed 10 tablet 11  . Spacer/Aero-Holding Chambers (OPTICHAMBER DIAMOND) MISC Use with inhaler 1 each 1   Social History   Socioeconomic History  . Marital status: Legally Separated    Spouse name: Not on file  . Number of children: Not on file  . Years of education: Not on file  . Highest education level: Not on file  Occupational History  . Not on file  Tobacco Use  . Smoking status: Current Every Day Smoker    Packs/day: 1.00    Types: Cigarettes    Last attempt to quit: 06/16/2012    Years since quitting: 7.3  . Smokeless tobacco: Never Used  Substance and Sexual Activity  . Alcohol use: No    Alcohol/week: 0.0 standard  drinks  . Drug use: No  . Sexual activity: Yes    Birth control/protection: None  Other Topics Concern  . Not on file  Social History Narrative  . Not on file   Social Determinants of Health   Financial Resource Strain:   . Difficulty of Paying Living Expenses:   Food Insecurity:   . Worried About Programme researcher, broadcasting/film/video in the Last Year:   . Barista in the Last Year:   Transportation Needs:   . Freight forwarder (Medical):   Marland Kitchen Lack of Transportation (Non-Medical):   Physical Activity:   . Days of Exercise per Week:   . Minutes of Exercise per Session:   Stress:   . Feeling of Stress :   Social Connections:   . Frequency of Communication with Friends and Family:   . Frequency of Social Gatherings with Friends and Family:   . Attends Religious Services:   . Active Member of Clubs or Organizations:   . Attends Banker Meetings:   Marland Kitchen Marital Status:   Intimate Partner Violence:   . Fear of Current or Ex-Partner:   . Emotionally Abused:   Marland Kitchen Physically Abused:   . Sexually Abused:    Family History  Problem Relation Age of Onset  . Birth defects Daughter        pectus excavatum surgery at age 66  . Diabetes Mother     OBJECTIVE: Vitals:   10/16/19 1705 10/16/19 1706  BP:  121/77  Pulse: 87   Resp: 16   Temp: 98.7 F (37.1 C)   SpO2: 100%     General appearance: alert; no distress Head: NCAT Lungs: clear to auscultation bilaterally Heart: regular rate and rhythm.  Radial pulse 2+ bilaterally Extremities: no edema Skin: warm and dry; ingrown toenails to medial aspect of both great toes, R with paronychia Psychological: alert and cooperative; normal mood and affect  ASSESSMENT & PLAN:  1. Paronychia of great toe, right     Meds ordered this encounter  Medications  . sulfamethoxazole-trimethoprim (BACTRIM DS) 800-160 MG tablet    Sig: Take 1 tablet by mouth 2 (two) times daily for 7 days.    Dispense:  14 tablet    Refill:  0     Order Specific Question:   Supervising Provider    Answer:   Merrilee Jansky [id number]  . traMADol (ULTRAM) 50 MG tablet    Sig: Take 1 tablet (50 mg total) by mouth every 6 (six) hours as needed.    Dispense:  15 tablet    Refill:  0  Order Specific Question:   Supervising Provider    Answer:   Merrilee Jansky [[id number]]    Paronychia Prescribed Bactrim Prescribed Tramadol Take as prescribed and to completion Avoid hot showers/ baths Moisturize skin daily  Follow up with PCP if symptoms persists Return or go to the ER if you have any new or worsening symptoms such as fever, chills, nausea, vomiting, redness, swelling, discharge, if symptoms do not improve with medications  Reviewed expectations re: course of current medical issues. Questions answered. Outlined signs and symptoms indicating need for more acute intervention. Patient verbalized understanding. After Visit Summary given.    Moshe Cipro, NP 10/19/19 1015

## 2019-10-21 NOTE — Telephone Encounter (Signed)
Set up a virtual OV for all this

## 2019-10-22 ENCOUNTER — Encounter: Payer: Self-pay | Admitting: Adult Health

## 2019-10-22 ENCOUNTER — Telehealth (INDEPENDENT_AMBULATORY_CARE_PROVIDER_SITE_OTHER): Payer: Medicaid Other | Admitting: Adult Health

## 2019-10-22 DIAGNOSIS — L03031 Cellulitis of right toe: Secondary | ICD-10-CM

## 2019-10-22 DIAGNOSIS — T7840XA Allergy, unspecified, initial encounter: Secondary | ICD-10-CM

## 2019-10-22 MED ORDER — DOXYCYCLINE HYCLATE 100 MG PO CAPS: 100.0000 mg | ORAL_CAPSULE | Freq: Two times a day (BID) | ORAL | 0 refills | Status: DC

## 2019-10-22 NOTE — Progress Notes (Signed)
Virtual Visit via Video Note  I connected with Diana Nelson on 10/22/19 at 11:30 AM EDT by a video enabled telemedicine application and verified that I am speaking with the correct person using two identifiers.  Location patient: home Location provider:work or home office Persons participating in the virtual visit: patient, provider  I discussed the limitations of evaluation and management by telemedicine and the availability of in person appointments. The patient expressed understanding and agreed to proceed.   HPI: 39 year old female who is being evaluated today for concern of medication reaction.  She was seen at [facility] urgent care 7 days ago and diagnosed with a paronychia of the right great toe.  She was prescribed Bactrim DS and tramadol.  About 2 days after starting Bactrim she noticed that there was a burning sensation with erythema and swelling to the distal ends of her upper and lower extremities.  He also felt as though her nails are breaking off.  Symptoms would improve throughout the day until she took her additional dose of Bactrim and her symptoms would return.  Unfortunately, her paronychia has not improved either.  She denies fevers, chills, full body rash, lesions in her oral mucosa, vision disturbances.    ROS: See pertinent positives and negatives per HPI.  Past Medical History:  Diagnosis Date  . ADHD   . Anemia   . Anxiety    on xanax  . Asthma    uses once or twice a day   . 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 Eczema   . GERD (gastroesophageal reflux disease)   . Hypertension   . Migraines   . PONV (postoperative nausea and vomiting)     Past Surgical History:  Procedure Laterality Date  . knee surgeries     x3  . SPINAL FUSION     2006   . TONSILLECTOMY      Family History  Problem Relation Age of Onset  . Birth defects Daughter        pectus excavatum surgery at age 75  . Diabetes Mother        Current Outpatient Medications:  .   albuterol (VENTOLIN HFA) 108 (90 Base) MCG/ACT inhaler, INHALE 2 PUFFS BY MOUTH EVERY 4 HOURS AS NEEDED FOR WHEEZE OR FOR SHORTNESS OF BREATH, Disp: 8.5 g, Rfl: 2 .  ALPRAZolam (XANAX) 1 MG tablet, TAKE 1 TABLET BY MOUTH 3 TIMES A DAY AS NEEDED FOR ANXIETY, Disp: 90 tablet, Rfl: 5 .  amoxicillin-clavulanate (AUGMENTIN) 875-125 MG tablet, Take 1 tablet by mouth 2 (two) times daily., Disp: 20 tablet, Rfl: 0 .  Cholecalciferol (VITAMIN D) 50 MCG (2000 UT) CAPS, Take 2,000 Units by mouth daily., Disp: , Rfl:  .  ferrous sulfate 325 (65 FE) MG tablet, Take 325 mg by mouth daily with breakfast., Disp: , Rfl:  .  fluconazole (DIFLUCAN) 150 MG tablet, TAKE 1 TABLET BY MOUTH AS ONE DOSE, Disp: 1 tablet, Rfl: 5 .  fluticasone (FLONASE) 50 MCG/ACT nasal spray, Place 2 sprays into both nostrils daily. (Patient taking differently: Place 2 sprays into both nostrils daily as needed for allergies. ), Disp: 16 g, Rfl: 6 .  ibuprofen (ADVIL) 600 MG tablet, Take 1 tablet (600 mg total) by mouth every 6 (six) hours as needed for moderate pain., Disp: 45 tablet, Rfl: 1 .  loratadine (CLARITIN) 10 MG tablet, Take 10 mg by mouth daily. , Disp: , Rfl:  .  methyldopa (ALDOMET) 500 MG tablet, TAKE 1/2 TABLET BID,  Disp: 90 tablet, Rfl: 3 .  [START ON 10/26/2019] methylphenidate (RITALIN) 20 MG tablet, Take 1 tablet (20 mg total) by mouth 3 (three) times daily with meals., Disp: 90 tablet, Rfl: 0 .  norethindrone (ORTHO MICRONOR) 0.35 MG tablet, Take 1 tablet (0.35 mg total) by mouth daily., Disp: 1 Package, Rfl: 11 .  omeprazole (PRILOSEC) 40 MG capsule, TAKE 1 CAPSULE BY MOUTH TWICE A DAY, Disp: 60 capsule, Rfl: 11 .  Prenatal MV-Min-FA-Omega-3 (PRENATAL GUMMIES/DHA & FA PO), Take 1 tablet by mouth daily., Disp: , Rfl:  .  rizatriptan (MAXALT) 10 MG tablet, Take 1 tablet (10 mg total) by mouth as needed for migraine. May repeat in 2 hours if needed, Disp: 10 tablet, Rfl: 11 .  Spacer/Aero-Holding [facility] DIAMOND)  MISC, Use with inhaler, Disp: 1 each, Rfl: 1 .  sulfamethoxazole-trimethoprim (BACTRIM DS) 800-160 MG tablet, Take 1 tablet by mouth 2 (two) times daily for 7 days., Disp: 14 tablet, Rfl: 0 .  traMADol (ULTRAM) 50 MG tablet, Take 1 tablet (50 mg total) by mouth every 6 (six) hours as needed., Disp: 15 tablet, Rfl: 0  EXAM:  VITALS per patient if applicable:  GENERAL: alert, oriented, appears well and in no acute distress  HEENT: atraumatic, conjunttiva clear, no obvious abnormalities on inspection of external nose and ears  NECK: normal movements of the head and neck  LUNGS: on inspection no signs of respiratory distress, breathing rate appears normal, no obvious gross SOB, gasping or wheezing  CV: no obvious cyanosis  MS: moves all visible extremities without noticeable abnormality  PSYCH/NEURO: pleasant and cooperative, no obvious depression or anxiety, speech and thought processing grossly intact  ASSESSMENT AND PLAN:  Discussed the following assessment and plan:  1. Allergic reaction, initial encounter -We will have her stop Bactrim.  Does not necessarily seem to be Stevens-Johnson syndrome.  Symptoms improve as antibiotic depletes.  Encouraged to stay hydrated.  2. Paronychia of great toe, right -We will have her wait a few days before starting doxycycline for paronychia.  If no improvement she may need to come in and have incision and drainage.     I discussed the assessment and treatment plan with the patient. The patient was provided an opportunity to ask questions and all were answered. The patient agreed with the plan and demonstrated an understanding of the instructions.   The patient was advised to call back or seek an in-person evaluation if the symptoms worsen or if the condition fails to improve as anticipated.   Dorothyann Peng, NP

## 2019-10-27 ENCOUNTER — Other Ambulatory Visit: Payer: Self-pay | Admitting: Family Medicine

## 2019-10-28 NOTE — Telephone Encounter (Signed)
Last filled 05/11/2019 Last OV 10/05/2018  Ok to fill?

## 2019-11-01 ENCOUNTER — Other Ambulatory Visit: Payer: Self-pay | Admitting: Family Medicine

## 2019-11-02 ENCOUNTER — Encounter: Payer: Self-pay | Admitting: Family Medicine

## 2019-11-02 ENCOUNTER — Telehealth (INDEPENDENT_AMBULATORY_CARE_PROVIDER_SITE_OTHER): Payer: Medicaid Other | Admitting: Family Medicine

## 2019-11-02 DIAGNOSIS — L03031 Cellulitis of right toe: Secondary | ICD-10-CM | POA: Diagnosis not present

## 2019-11-02 DIAGNOSIS — B351 Tinea unguium: Secondary | ICD-10-CM | POA: Diagnosis not present

## 2019-11-02 MED ORDER — TERBINAFINE HCL 250 MG PO TABS: 250.0000 mg | ORAL_TABLET | Freq: Every day | ORAL | 1 refills | Status: DC

## 2019-11-02 NOTE — Progress Notes (Signed)
Subjective:    Patient ID: Diana Nelson, female    DOB: 04-05-1981, 39 y.o.   MRN: [id number]  HPI Virtual Visit via Video Note  I connected with the patient on 11/02/19 at  2:15 PM EDT by a video enabled telemedicine application and verified that I am speaking with the correct person using two identifiers.  Location patient: home Location provider:work or home office Persons participating in the virtual visit: patient, provider  I discussed the limitations of evaluation and management by telemedicine and the availability of in person appointments. The patient expressed understanding and agreed to proceed.   HPI: Here to follow up on paronychia and to ask about fungal infection in the toenails. She saw urgent care on 10-16-19 for a paronychia on the right great toe, and they gave her Bactrim DS. This did not help so she saw our clinic on 10-22-19. She was switched to Doxycycline, and this has worked very well. The redness and tenderness have resolved. However she has fungal involvement in numerous toenails causing the nails to be thick and crumbly.    ROS: See pertinent positives and negatives per HPI.  Past Medical History:  Diagnosis Date  . ADHD   . Anemia   . Anxiety    on xanax  . Asthma    uses once or twice a day   . 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 Eczema   . GERD (gastroesophageal reflux disease)   . Hypertension   . Migraines   . PONV (postoperative nausea and vomiting)     Past Surgical History:  Procedure Laterality Date  . knee surgeries     x3  . SPINAL FUSION     2006   . TONSILLECTOMY      Family History  Problem Relation Age of Onset  . Birth defects Daughter        pectus excavatum surgery at age 71  . Diabetes Mother      Current Outpatient Medications:  .  albuterol (VENTOLIN HFA) 108 (90 Base) MCG/ACT inhaler, INHALE 2 PUFFS BY MOUTH EVERY 4 HOURS AS NEEDED FOR WHEEZE OR FOR SHORTNESS OF BREATH, Disp: 8.5 g, Rfl: 2 .  ALPRAZolam (XANAX)  1 MG tablet, TAKE 1 TABLET BY MOUTH THREE TIMES A DAY AS NEEDED FOR ANXIETY, Disp: 90 tablet, Rfl: 5 .  Cholecalciferol (VITAMIN D) 50 MCG (2000 UT) CAPS, Take 2,000 Units by mouth daily., Disp: , Rfl:  .  doxycycline (VIBRAMYCIN) 100 MG capsule, Take 1 capsule (100 mg total) by mouth 2 (two) times daily., Disp: 14 capsule, Rfl: 0 .  ferrous sulfate 325 (65 FE) MG tablet, Take 325 mg by mouth daily with breakfast., Disp: , Rfl:  .  fluconazole (DIFLUCAN) 150 MG tablet, TAKE 1 TABLET BY MOUTH AS ONE DOSE, Disp: 1 tablet, Rfl: 5 .  fluticasone (FLONASE) 50 MCG/ACT nasal spray, Place 2 sprays into both nostrils daily. (Patient taking differently: Place 2 sprays into both nostrils daily as needed for allergies. ), Disp: 16 g, Rfl: 6 .  ibuprofen (ADVIL) 600 MG tablet, Take 1 tablet (600 mg total) by mouth every 6 (six) hours as needed for moderate pain., Disp: 45 tablet, Rfl: 1 .  loratadine (CLARITIN) 10 MG tablet, Take 10 mg by mouth daily. , Disp: , Rfl:  .  methyldopa (ALDOMET) 500 MG tablet, TAKE 1/2 TABLET BID, Disp: 90 tablet, Rfl: 3 .  methylphenidate (RITALIN) 20 MG tablet, Take 1 tablet (20 mg total) by mouth  3 (three) times daily with meals., Disp: 90 tablet, Rfl: 0 .  norethindrone (ORTHO MICRONOR) 0.35 MG tablet, Take 1 tablet (0.35 mg total) by mouth daily., Disp: 1 Package, Rfl: 11 .  omeprazole (PRILOSEC) 40 MG capsule, TAKE 1 CAPSULE BY MOUTH TWICE A DAY, Disp: 60 capsule, Rfl: 11 .  Prenatal MV-Min-FA-Omega-3 (PRENATAL GUMMIES/DHA & FA PO), Take 1 tablet by mouth daily., Disp: , Rfl:  .  rizatriptan (MAXALT) 10 MG tablet, Take 1 tablet (10 mg total) by mouth as needed for migraine. May repeat in 2 hours if needed, Disp: 10 tablet, Rfl: 11 .  Spacer/Aero-Holding Chambers [facility] DIAMOND) MISC, Use with inhaler, Disp: 1 each, Rfl: 1 .  traMADol (ULTRAM) 50 MG tablet, Take 1 tablet (50 mg total) by mouth every 6 (six) hours as needed., Disp: 15 tablet, Rfl: 0  EXAM:  VITALS per  patient if applicable:  GENERAL: alert, oriented, appears well and in no acute distress  HEENT: atraumatic, conjunttiva clear, no obvious abnormalities on inspection of external nose and ears  NECK: normal movements of the head and neck  LUNGS: on inspection no signs of respiratory distress, breathing rate appears normal, no obvious gross SOB, gasping or wheezing  CV: no obvious cyanosis  MS: moves all visible extremities without noticeable abnormality  PSYCH/NEURO: pleasant and cooperative, no obvious depression or anxiety, speech and thought processing grossly intact  ASSESSMENT AND PLAN: The paronychia has resolved. Treat the onychomycosis with Terbinafine for 3-6 months.  Alysia Penna, MD  Discussed the following assessment and plan:  No diagnosis found.     I discussed the assessment and treatment plan with the patient. The patient was provided an opportunity to ask questions and all were answered. The patient agreed with the plan and demonstrated an understanding of the instructions.   The patient was advised to call back or seek an in-person evaluation if the symptoms worsen or if the condition fails to improve as anticipated.     Review of Systems     Objective:   Physical Exam        Assessment & Plan:

## 2019-11-13 ENCOUNTER — Other Ambulatory Visit: Payer: Self-pay | Admitting: Family Medicine

## 2019-11-15 NOTE — Telephone Encounter (Signed)
Please advise. Should the patient continue this medication? 

## 2019-11-16 NOTE — Telephone Encounter (Signed)
I need to know why this was prescribed for her. There is nothing in the chart

## 2019-11-17 ENCOUNTER — Other Ambulatory Visit: Payer: Self-pay | Admitting: Family Medicine

## 2019-11-17 NOTE — Telephone Encounter (Signed)
I understand. Please call in #[phone] with 11 rf

## 2019-11-17 NOTE — Telephone Encounter (Signed)
Last filled 10/26/2019 Last OV 10/05/2019 Ok to fill?

## 2019-11-18 MED ORDER — METHYLPHENIDATE HCL 20 MG PO TABS: 20.0000 mg | ORAL_TABLET | Freq: Three times a day (TID) | ORAL | 0 refills | Status: DC

## 2019-11-18 MED ORDER — CLINDAMYCIN PHOSPHATE 1 % EX SWAB: CUTANEOUS | 5 refills | Status: DC

## 2019-11-18 NOTE — Addendum Note (Signed)
Addended by: Solon Augusta on: 11/18/2019 08:46 AM   Modules accepted: Orders

## 2019-11-18 NOTE — Telephone Encounter (Signed)
Done

## 2020-01-17 ENCOUNTER — Other Ambulatory Visit: Payer: Self-pay | Admitting: Family Medicine

## 2020-02-06 ENCOUNTER — Encounter: Payer: Self-pay | Admitting: Family Medicine

## 2020-02-09 MED ORDER — LISINOPRIL-HYDROCHLOROTHIAZIDE 10-12.5 MG PO TABS: 1.0000 | ORAL_TABLET | Freq: Every day | ORAL | 0 refills | Status: DC

## 2020-02-14 ENCOUNTER — Other Ambulatory Visit: Payer: Self-pay | Admitting: Family Medicine

## 2020-02-15 NOTE — Telephone Encounter (Signed)
Last OV 10/22/19 video visit Last labs 06/13/19  Last refill 01/26/20  No current controlled substance contract.

## 2020-02-17 MED ORDER — METHYLPHENIDATE HCL 20 MG PO TABS: 20.0000 mg | ORAL_TABLET | Freq: Three times a day (TID) | ORAL | 0 refills | Status: DC

## 2020-02-17 NOTE — Telephone Encounter (Signed)
Done

## 2020-02-25 ENCOUNTER — Encounter: Payer: Self-pay | Admitting: Family Medicine

## 2020-02-25 ENCOUNTER — Other Ambulatory Visit: Payer: Self-pay

## 2020-02-25 ENCOUNTER — Ambulatory Visit: Payer: Medicaid Other | Admitting: Family Medicine

## 2020-02-25 VITALS — BP 110/78 | HR 70 | Temp 98.1°F | Ht 62.0 in | Wt 209.6 lb

## 2020-02-25 DIAGNOSIS — L84 Corns and callosities: Secondary | ICD-10-CM

## 2020-02-25 DIAGNOSIS — Z23 Encounter for immunization: Secondary | ICD-10-CM | POA: Diagnosis not present

## 2020-02-25 DIAGNOSIS — I10 Essential (primary) hypertension: Secondary | ICD-10-CM | POA: Diagnosis not present

## 2020-02-25 MED ORDER — LISINOPRIL-HYDROCHLOROTHIAZIDE 10-12.5 MG PO TABS: 1.0000 | ORAL_TABLET | Freq: Every day | ORAL | 3 refills | Status: DC

## 2020-02-25 NOTE — Addendum Note (Signed)
Addended by: Wilford Corner on: 02/25/2020 11:57 AM   Modules accepted: Orders

## 2020-02-25 NOTE — Progress Notes (Signed)
   Subjective:    Patient ID: Diana Nelson, female    DOB: [birth year], 39 y.o.   MRN: [id number]  HPI Here for 2 issues. First she was recently switched off Methyldopa because it is hard to get at the pharmacy. She went back on Lisinopril HCT, and she has done well. Also for about 6 months she has had calluses on both feet and several toes that can be painful.    Review of Systems  Constitutional: Negative.   Respiratory: Negative.   Cardiovascular: Negative.        Objective:   Physical Exam Constitutional:      Appearance: Normal appearance.  Cardiovascular:     Rate and Rhythm: Normal rate and regular rhythm.     Pulses: Normal pulses.     Heart sounds: Normal heart sounds.  Pulmonary:     Effort: Pulmonary effort is normal.     Breath sounds: Normal breath sounds.  Skin:    Comments: There are several small calluses on the balls of both feet and on multiple toes   Neurological:     Mental Status: She is alert.           Assessment & Plan:  Her HTN is well controlled, so we will refill the Lisnopril HCT. Refer to Podiatry for the calluses.  Gershon Crane, MD

## 2020-03-17 ENCOUNTER — Ambulatory Visit: Payer: Medicaid Other | Admitting: Family Medicine

## 2020-04-16 ENCOUNTER — Other Ambulatory Visit: Payer: Self-pay | Admitting: Family Medicine

## 2020-04-21 ENCOUNTER — Ambulatory Visit: Payer: No Typology Code available for payment source | Attending: Internal Medicine

## 2020-04-21 DIAGNOSIS — Z23 Encounter for immunization: Secondary | ICD-10-CM

## 2020-04-21 NOTE — Progress Notes (Signed)
   Covid-19 Vaccination Clinic  Name:  Diana Nelson    MRN: [id number] DOB: [birth year]  04/21/2020  Diana Nelson was observed post Covid-19 immunization for 15 minutes without incident. She was provided with Vaccine Information Sheet and instruction to access the V-Safe system.   Diana Nelson was instructed to call 911 with any severe reactions post vaccine: Marland Kitchen Difficulty breathing  . Swelling of face and throat  . A fast heartbeat  . A bad rash all over body  . Dizziness and weakness   Immunizations Administered    Name Date Dose VIS Date Route   Pfizer COVID-19 Vaccine 04/21/2020  4:17 PM 0.3 mL 03/08/2020 Intramuscular   Manufacturer: [employer], [employer]   Lot: [id number]   NDC: [address]-8

## 2020-05-08 ENCOUNTER — Other Ambulatory Visit: Payer: Self-pay | Admitting: Family Medicine

## 2020-05-10 MED ORDER — METHYLPHENIDATE HCL 20 MG PO TABS: 20.0000 mg | ORAL_TABLET | Freq: Three times a day (TID) | ORAL | 0 refills | Status: DC

## 2020-05-10 NOTE — Telephone Encounter (Signed)
Done

## 2020-05-14 ENCOUNTER — Other Ambulatory Visit: Payer: Self-pay | Admitting: Family Medicine

## 2020-07-17 ENCOUNTER — Other Ambulatory Visit: Payer: Self-pay | Admitting: Family Medicine

## 2020-07-18 NOTE — Telephone Encounter (Signed)
Refill was sent to pharmacy on 07/17/2020

## 2020-07-19 ENCOUNTER — Other Ambulatory Visit: Payer: Self-pay | Admitting: Family Medicine

## 2020-07-31 ENCOUNTER — Encounter (INDEPENDENT_AMBULATORY_CARE_PROVIDER_SITE_OTHER): Payer: Self-pay

## 2020-07-31 NOTE — Telephone Encounter (Signed)
Refill too soon not due until 08/14/2020

## 2020-08-04 ENCOUNTER — Telehealth: Payer: Medicaid Other | Admitting: Family Medicine

## 2020-08-04 ENCOUNTER — Telehealth (INDEPENDENT_AMBULATORY_CARE_PROVIDER_SITE_OTHER): Payer: Medicaid Other | Admitting: Adult Health

## 2020-08-04 ENCOUNTER — Encounter: Payer: Self-pay | Admitting: Adult Health

## 2020-08-04 DIAGNOSIS — J0111 Acute recurrent frontal sinusitis: Secondary | ICD-10-CM

## 2020-08-04 MED ORDER — AMOXICILLIN-POT CLAVULANATE 875-125 MG PO TABS: 1.0000 | ORAL_TABLET | Freq: Two times a day (BID) | ORAL | 0 refills | Status: DC

## 2020-08-04 NOTE — Progress Notes (Signed)
Virtual Visit via Video Note  I connected with Diana Nelson on 08/04/20 at  4:00 PM EDT by a video enabled telemedicine application and verified that I am speaking with the correct person using two identifiers.  Location patient: home Location provider:work or home office Persons participating in the virtual visit: patient, provider  I discussed the limitations of evaluation and management by telemedicine and the availability of in person appointments. The patient expressed understanding and agreed to proceed.   HPI:  40 year old female who is being evaluated today for an acute issue.  Symptoms started 7 to 10 days ago.  Symptoms include sinus pain and pressure, nasal congestion, discolored mucus, productive cough, subjective fevers and chills.  She has been using Mucinex, Alka-Seltzer, Motrin, and Goody's powder with minimal relief.  ROS: See pertinent positives and negatives per HPI.  Past Medical History:  Diagnosis Date  . ADHD   . Anemia   . Anxiety    on xanax  . Asthma    uses once or twice a day   . 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 Eczema   . GERD (gastroesophageal reflux disease)   . Hypertension   . Migraines   . PONV (postoperative nausea and vomiting)     Past Surgical History:  Procedure Laterality Date  . knee surgeries     x3  . SPINAL FUSION     2006   . TONSILLECTOMY      Family History  Problem Relation Age of Onset  . Birth defects Daughter        pectus excavatum surgery at age 43  . Diabetes Mother        Current Outpatient Medications:  .  albuterol (PROAIR HFA) 108 (90 Base) MCG/ACT inhaler, INHALE 2 PUFFS BY MOUTH EVERY 4 HOURS AS NEEDED FOR WHEEZE OR FOR SHORTNESS OF BREATH, Disp: 8.5 each, Rfl: 2 .  ALPRAZolam (XANAX) 1 MG tablet, TAKE 1 TABLET BY MOUTH THREE TIMES A DAY AS NEEDED FOR ANXIETY, Disp: 90 tablet, Rfl: 5 .  Cholecalciferol (VITAMIN D) 50 MCG (2000 UT) CAPS, Take 2,000 Units by mouth daily., Disp: , Rfl:  .  clindamycin  (CLEOCIN T) 1 % SWAB, APPLY TO AFFECTED AREA TWICE A DAY, Disp: 60 each, Rfl: 5 .  fluconazole (DIFLUCAN) 150 MG tablet, TAKE 1 TABLET BY MOUTH AS ONE DOSE, Disp: 1 tablet, Rfl: 5 .  fluticasone (FLONASE) 50 MCG/ACT nasal spray, Place 2 sprays into both nostrils daily. (Patient taking differently: Place 2 sprays into both nostrils daily as needed for allergies.), Disp: 16 g, Rfl: 6 .  ibuprofen (ADVIL) 600 MG tablet, Take 1 tablet (600 mg total) by mouth every 6 (six) hours as needed for moderate pain., Disp: 45 tablet, Rfl: 1 .  ketoconazole (NIZORAL) 2 % cream, APPLY TO AFFECTED AREA TWICE A DAY, Disp: 15 g, Rfl: 5 .  lisinopril-hydrochlorothiazide (ZESTORETIC) 10-12.5 MG tablet, Take 1 tablet by mouth daily., Disp: 90 tablet, Rfl: 3 .  loratadine (CLARITIN) 10 MG tablet, Take 10 mg by mouth daily., Disp: , Rfl:  .  methylphenidate (RITALIN) 20 MG tablet, Take 1 tablet (20 mg total) by mouth 3 (three) times daily with meals., Disp: 90 tablet, Rfl: 0 .  norethindrone (ORTHO MICRONOR) 0.35 MG tablet, Take 1 tablet (0.35 mg total) by mouth daily., Disp: 1 Package, Rfl: 11 .  omeprazole (PRILOSEC) 40 MG capsule, TAKE 1 CAPSULE BY MOUTH TWICE A DAY, Disp: 60 capsule, Rfl: 11 .  rizatriptan (MAXALT) 10  MG tablet, TAKE 1 TABLET BY MOUTH AS NEEDED FOR MIGRAINE. MAY REPEAT IN 2 HOURS IF NEEDED, Disp: 10 tablet, Rfl: 3 .  Spacer/Aero-Holding Chambers [facility] DIAMOND) MISC, Use with inhaler, Disp: 1 each, Rfl: 1  EXAM:  VITALS per patient if applicable:  GENERAL: alert, oriented, appears well and in no acute distress  HEENT: atraumatic, conjunttiva clear, no obvious abnormalities on inspection of external nose and ears  NECK: normal movements of the head and neck  LUNGS: on inspection no signs of respiratory distress, breathing rate appears normal, no obvious gross SOB, gasping or wheezing. Cough present.   CV: no obvious cyanosis  MS: moves all visible extremities without noticeable  abnormality  PSYCH/NEURO: pleasant and cooperative, no obvious depression or anxiety, speech and thought processing grossly intact  ASSESSMENT AND PLAN:  Discussed the following assessment and plan:  1. Acute recurrent frontal sinusitis  - amoxicillin-clavulanate (AUGMENTIN) 875-125 MG tablet; Take 1 tablet by mouth 2 (two) times daily.  Dispense: 20 tablet; Refill: 0 - Follow up if no improvement in the next 2-3 days   Shirline Frees, NP     I discussed the assessment and treatment plan with the patient. The patient was provided an opportunity to ask questions and all were answered. The patient agreed with the plan and demonstrated an understanding of the instructions.   The patient was advised to call back or seek an in-person evaluation if the symptoms worsen or if the condition fails to improve as anticipated.   Shirline Frees, NP

## 2020-08-09 ENCOUNTER — Other Ambulatory Visit: Payer: Self-pay | Admitting: Family Medicine

## 2020-08-09 NOTE — Telephone Encounter (Signed)
Duplicate Rx request. Please deny this to remove it from the basket.

## 2020-08-09 NOTE — Telephone Encounter (Signed)
Last filled 07/17/2020 Last OV 02/25/2020 Due for a physical

## 2020-08-10 ENCOUNTER — Encounter: Payer: Self-pay | Admitting: Family Medicine

## 2020-08-10 ENCOUNTER — Other Ambulatory Visit: Payer: Self-pay

## 2020-08-10 DIAGNOSIS — M545 Low back pain, unspecified: Secondary | ICD-10-CM

## 2020-08-10 MED ORDER — METHYLPHENIDATE HCL 20 MG PO TABS
20.0000 mg | ORAL_TABLET | Freq: Three times a day (TID) | ORAL | 0 refills | Status: DC
Start: 2020-10-10 — End: 2020-11-10

## 2020-08-10 MED ORDER — MELOXICAM 15 MG PO TABS: 15.0000 mg | ORAL_TABLET | Freq: Every day | ORAL | 3 refills | Status: DC

## 2020-08-10 MED ORDER — METHYLPHENIDATE HCL 20 MG PO TABS: 20.0000 mg | ORAL_TABLET | Freq: Three times a day (TID) | ORAL | 0 refills | Status: DC

## 2020-08-10 MED ORDER — MELOXICAM 15 MG PO TABS: 15.0000 mg | ORAL_TABLET | Freq: Every day | ORAL | 0 refills | Status: DC

## 2020-08-10 NOTE — Telephone Encounter (Signed)
Call in Meloxicam 15 mg daily, #90 with 3 rf  

## 2020-08-10 NOTE — Telephone Encounter (Signed)
Done

## 2020-08-14 ENCOUNTER — Other Ambulatory Visit: Payer: Self-pay | Admitting: Family Medicine

## 2020-08-16 ENCOUNTER — Other Ambulatory Visit: Payer: Self-pay | Admitting: Adult Health

## 2020-08-16 DIAGNOSIS — J0111 Acute recurrent frontal sinusitis: Secondary | ICD-10-CM

## 2020-08-16 NOTE — Telephone Encounter (Signed)
Last refill- 08/04/2020 Last office visit- 08/04/2020  No future appointment has bee schedule

## 2020-09-22 ENCOUNTER — Other Ambulatory Visit: Payer: Self-pay | Admitting: Family Medicine

## 2020-09-29 ENCOUNTER — Encounter (HOSPITAL_COMMUNITY): Payer: Self-pay | Admitting: Emergency Medicine

## 2020-09-29 ENCOUNTER — Ambulatory Visit (INDEPENDENT_AMBULATORY_CARE_PROVIDER_SITE_OTHER): Payer: Medicaid Other

## 2020-09-29 ENCOUNTER — Other Ambulatory Visit: Payer: Self-pay

## 2020-09-29 ENCOUNTER — Ambulatory Visit (HOSPITAL_COMMUNITY)
Admission: EM | Admit: 2020-09-29 | Discharge: 2020-09-29 | Disposition: A | Discharge status: Home / Self Care | Payer: Medicaid Other | Attending: Internal Medicine | Admitting: Internal Medicine

## 2020-09-29 DIAGNOSIS — M79672 Pain in left foot: Secondary | ICD-10-CM | POA: Diagnosis not present

## 2020-09-29 DIAGNOSIS — S92532A Displaced fracture of distal phalanx of left lesser toe(s), initial encounter for closed fracture: Secondary | ICD-10-CM | POA: Diagnosis not present

## 2020-09-29 NOTE — ED Triage Notes (Signed)
Pt presents with left foot pain xs 2 days. States kicked bottom of couch while walking through living room.

## 2020-09-29 NOTE — Discharge Instructions (Signed)
Follow up with ortho as soon as possible (call first thing Monday morning).    Take Ibuprofen and Tylenol as needed for pain.   Rest as much as possible Ice for 10-15 minutes every 4-6 hours as needed for pain and swelling Compression- use an ace bandage or splint for comfort Elevate above your hip when sitting and laying down.

## 2020-09-29 NOTE — ED Provider Notes (Signed)
[facility]    CSN: [id number] Arrival date & time: 09/29/20  1930      History   Chief Complaint Chief Complaint  Patient presents with  . Foot Pain    left    HPI Diana Nelson is a 40 y.o. female.   Patient here for evaluation of left foot pain.  Reports kicking couch and developing pain approximately 2 days ago.  Reports having significant bruising and swelling to left fifth toe and foot.  Reports pain while walking.  Has not taken any OTC medications or treatments. Denies any fevers, chest pain, shortness of breath, N/V/D, numbness, tingling, weakness, abdominal pain, or headaches.   ROS: As per HPI, all other pertinent ROS negative   The history is provided by the patient.  Foot Pain    Past Medical History:  Diagnosis Date  . ADHD   . Anemia   . Anxiety    on xanax  . Asthma    uses once or twice a day   . 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 Eczema   . GERD (gastroesophageal reflux disease)   . Hypertension   . Migraines   . PONV (postoperative nausea and vomiting)     Patient Active Problem List   Diagnosis Date Noted  . SVD (11/17) 04/06/2019  . Normal postpartum course 04/06/2019  . Chronic hypertension complicating or reason for care during pregnancy, third trimester 04/05/2019  . Acne vulgaris 01/01/2018  . Onychomycosis 01/01/2018  . Maternal chronic hypertension in third trimester 11/01/2017  . Chronic hypertension affecting pregnancy 09/09/2016  . Advanced maternal age in multigravida, first trimester 09/07/2016  . Current every day smoker 09/07/2016  . Obesity (BMI 30-39.9) 09/07/2016  . Consanguinity 09/07/2016  . Eczema 09/07/2016  . Depression with anxiety 12/29/2015  . Attention deficit hyperactivity disorder (ADHD) 12/29/2015  . Low back pain 02/06/2015  . HTN (hypertension) 02/06/2015  . GERD 01/12/2009  . 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 Asthma 07/31/2007  . 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 Migraine  headache 09/24/2006    Past Surgical History:  Procedure Laterality Date  . knee surgeries     x3  . SPINAL FUSION     2006   . TONSILLECTOMY      OB History    Gravida  6   Para  4   Term  4   Preterm      AB  1   Living  5     SAB  1   IAB      Ectopic      Multiple  0   Live Births  2            Home Medications    Prior to Admission medications   Medication Sig Start Date End Date Taking? Authorizing Provider  albuterol (PROAIR HFA) 108 (90 Base) MCG/ACT inhaler INHALE 2 PUFFS BY MOUTH EVERY 4 HOURS AS NEEDED FOR WHEEZE OR FOR SHORTNESS OF BREATH 05/15/20   Nelwyn Salisbury, MD  ALPRAZolam Prudy Feeler) 1 MG tablet TAKE 1 TABLET BY MOUTH THREE TIMES A DAY AS NEEDED FOR ANXIETY 04/19/20   Nelwyn Salisbury, MD  amoxicillin-clavulanate (AUGMENTIN) 875-125 MG tablet Take 1 tablet by mouth 2 (two) times daily. 08/04/20   Nafziger, Kandee Keen, NP  Cholecalciferol (VITAMIN D) 50 MCG (2000 UT) CAPS Take 2,000 Units by mouth daily.    [provider]  clindamycin (CLEOCIN T) 1 % SWAB APPLY TO AFFECTED AREA TWICE A  DAY 08/15/20   Nelwyn Salisbury, MD  fluconazole (DIFLUCAN) 150 MG tablet TAKE 1 TABLET BY MOUTH AS ONE DOSE 05/15/20   Nelwyn Salisbury, MD  fluticasone [facility]) 50 MCG/ACT nasal spray Place 2 sprays into both nostrils daily. Patient taking differently: Place 2 sprays into both nostrils daily as needed for allergies. 02/06/15   Nelwyn Salisbury, MD  ibuprofen (ADVIL) 600 MG tablet Take 1 tablet (600 mg total) by mouth every 6 (six) hours as needed for moderate pain. 04/07/19   Essie Hart, MD  ketoconazole (NIZORAL) 2 % cream APPLY TO AFFECTED AREA TWICE A DAY 04/19/20   Nelwyn Salisbury, MD  lisinopril-hydrochlorothiazide (ZESTORETIC) 10-12.5 MG tablet Take 1 tablet by mouth daily. 02/25/20   Nelwyn Salisbury, MD  loratadine (CLARITIN) 10 MG tablet Take 10 mg by mouth daily.    [provider]  meloxicam (MOBIC) 15 MG tablet Take 1 tablet (15 mg total) by mouth  daily. 08/10/20   Nelwyn Salisbury, MD  methylphenidate (RITALIN) 20 MG tablet Take 1 tablet (20 mg total) by mouth 3 (three) times daily with meals. 10/10/20 11/09/20  Nelwyn Salisbury, MD  norethindrone (ORTHO MICRONOR) 0.35 MG tablet Take 1 tablet (0.35 mg total) by mouth daily. 04/07/19   Essie Hart, MD  omeprazole (PRILOSEC) 40 MG capsule TAKE 1 CAPSULE BY MOUTH TWICE A DAY 09/22/20   Nelwyn Salisbury, MD  rizatriptan (MAXALT) 10 MG tablet TAKE 1 TABLET BY MOUTH AS NEEDED FOR MIGRAINE. MAY REPEAT IN 2 HOURS IF NEEDED 07/19/20   Nelwyn Salisbury, MD  Spacer/Aero-Holding Deretha [facility] DIAMOND) MISC Use with inhaler 12/02/17   Nelwyn Salisbury, MD    Family History Family History  Problem Relation Age of Onset  . Birth defects Daughter        pectus excavatum surgery at age 6  . Diabetes Mother     Social History Social History   Tobacco Use  . Smoking status: Current Every Day Smoker    Packs/day: 1.00    Types: Cigarettes    Last attempt to quit: 06/16/2012    Years since quitting: 8.2  . Smokeless tobacco: Never Used  Vaping Use  . Vaping Use: Never used  Substance Use Topics  . Alcohol use: No    Alcohol/week: 0.0 standard drinks  . Drug use: No     Allergies   Bactrim [sulfamethoxazole-trimethoprim], Azithromycin, and Lamictal [lamotrigine]   Review of Systems Review of Systems  Musculoskeletal: Positive for arthralgias and joint swelling.  All other systems reviewed and are negative.    Physical Exam Triage Vital Signs ED Triage Vitals  Enc Vitals Group     BP 09/29/20 1959 (!) 112/53     Pulse Rate 09/29/20 1959 85     Resp 09/29/20 1959 18     Temp 09/29/20 1959 98.6 F (37 C)     Temp Source 09/29/20 1959 Oral     SpO2 09/29/20 1959 100 %     Weight --      Height --      Head Circumference --      Peak Flow --      Pain Score 09/29/20 1957 8     Pain Loc --      Pain Edu? --      Excl. in GC? --    No data found.  Updated Vital Signs BP (!)  112/53 (BP Location: Right Arm)   Pulse 85   Temp 98.6  F (37 C) (Oral)   Resp 18   LMP 09/18/2020   SpO2 100%   Visual Acuity Right Eye Distance:   Left Eye Distance:   Bilateral Distance:    Right Eye Near:   Left Eye Near:    Bilateral Near:     Physical Exam Vitals and nursing note reviewed.  Constitutional:      General: She is not in acute distress.    Appearance: Normal appearance. She is not ill-appearing, toxic-appearing or diaphoretic.  HENT:     Head: Normocephalic and atraumatic.  Eyes:     Conjunctiva/sclera: Conjunctivae normal.  Cardiovascular:     Rate and Rhythm: Normal rate.     Pulses: Normal pulses.  Pulmonary:     Effort: Pulmonary effort is normal.  Abdominal:     General: Abdomen is flat.  Musculoskeletal:     Cervical back: Normal range of motion.     Right foot: Normal.     Left foot: Decreased range of motion. Normal capillary refill. Swelling, deformity, tenderness and bony tenderness present. No crepitus. Normal pulse.  Skin:    General: Skin is warm and dry.  Neurological:     General: No focal deficit present.     Mental Status: She is alert and oriented to person, place, and time.  Psychiatric:        Mood and Affect: Mood normal.      UC Treatments / Results  Labs (all labs ordered are listed, but only abnormal results are displayed) Labs Reviewed - No data to display  EKG   Radiology DG Foot Complete Right  Result Date: 09/29/2020 CLINICAL DATA:  Left foot pain and swelling x2 days after injury EXAM: RIGHT FOOT COMPLETE - 3+ VIEW COMPARISON:  None. FINDINGS: Minimally displaced oblique fracture of the proximal phalanx on the fifth digit extending from the medial proximal metadiaphysis through the lateral distal metadiaphysis. Soft tissue swelling about the lateral aspect of the foot. IMPRESSION: Minimally displaced oblique fracture of the proximal phalanx on the fifth digit. Electronically Signed   By: Maudry MayhewJeffrey  Waltz MD   On:  09/29/2020 20:30    Procedures Procedures (including critical care time)  Medications Ordered in UC Medications - No data to display  Initial Impression / Assessment and Plan / UC Course  I have reviewed the triage vital signs and the nursing notes.  Pertinent labs & imaging results that were available during my care of the patient were reviewed by me and considered in my medical decision making (see chart for details).    Assessment concerning for fracture.  X-ray with minimally displaced oblique fracture of the proximal phalanx of the fifth digit.  Patient placed in postop boot and instructed to follow-up with orthopedics as soon as possible.  May take ibuprofen and/or Tylenol as needed for pain.  Encourage patient to rest is much as possible.  Ice foot for comfort.  Elevate foot whenever sitting or laying down.  Work note given to patient.  Final Clinical Impressions(s) / UC Diagnoses   Final diagnoses:  Closed displaced fracture of distal phalanx of lesser toe of left foot, initial encounter  Foot pain, left     Discharge Instructions     Follow up with ortho as soon as possible (call first thing Monday morning).    Take Ibuprofen and Tylenol as needed for pain.   Rest as much as possible Ice for 10-15 minutes every 4-6 hours as needed for pain and swelling Compression- use an  ace bandage or splint for comfort Elevate above your hip when sitting and laying down.       ED Prescriptions    None     PDMP not reviewed this encounter.   Ivette Loyal, NP 09/29/20 [phone]

## 2020-09-30 ENCOUNTER — Encounter: Payer: Self-pay | Admitting: Family Medicine

## 2020-10-12 ENCOUNTER — Other Ambulatory Visit: Payer: Self-pay | Admitting: Family Medicine

## 2020-10-12 NOTE — Telephone Encounter (Signed)
Last refill-04/19/20--90 tabs, 5 refills Last video visit- 08/04/20  No future refills scheduled

## 2020-11-05 ENCOUNTER — Other Ambulatory Visit: Payer: Self-pay | Admitting: Family Medicine

## 2020-11-08 ENCOUNTER — Other Ambulatory Visit: Payer: Self-pay | Admitting: Family Medicine

## 2020-11-08 NOTE — Telephone Encounter (Signed)
Pt phone numbers not working, send pt a message on MyChart advising to call the office and schedule a virtual visit for Ritalin renewal

## 2020-11-08 NOTE — Telephone Encounter (Signed)
Sent pt a message on MyChart, no way to leave a message on her phone

## 2020-11-09 ENCOUNTER — Encounter (INDEPENDENT_AMBULATORY_CARE_PROVIDER_SITE_OTHER): Payer: Self-pay

## 2020-11-09 NOTE — Telephone Encounter (Signed)
Pt has scheduled appointment 11/10/2020

## 2020-11-10 ENCOUNTER — Encounter: Payer: Self-pay | Admitting: Family Medicine

## 2020-11-10 ENCOUNTER — Ambulatory Visit: Payer: Medicaid Other | Admitting: Family Medicine

## 2020-11-10 ENCOUNTER — Other Ambulatory Visit: Payer: Self-pay

## 2020-11-10 VITALS — BP 102/80 | HR 89 | Temp 98.4°F | Wt 189.0 lb

## 2020-11-10 DIAGNOSIS — F900 Attention-deficit hyperactivity disorder, predominantly inattentive type: Secondary | ICD-10-CM | POA: Diagnosis not present

## 2020-11-10 DIAGNOSIS — I10 Essential (primary) hypertension: Secondary | ICD-10-CM | POA: Diagnosis not present

## 2020-11-10 MED ORDER — METHYLPHENIDATE HCL 20 MG PO TABS: 20.0000 mg | ORAL_TABLET | Freq: Three times a day (TID) | ORAL | 0 refills | Status: DC

## 2020-11-10 MED ORDER — TERBINAFINE HCL 250 MG PO TABS: 250.0000 mg | ORAL_TABLET | Freq: Every day | ORAL | 1 refills | Status: DC

## 2020-11-10 NOTE — Progress Notes (Signed)
   Subjective:    Patient ID: Diana Nelson, female    DOB: Dec 02, 1980, 40 y.o.   MRN: [id number]  HPI Here to follow up on HTN and ADHD. She has been doing well. Her job is quite physical and this has enabled her to lose about 60 lbs over the past year. Her BP is stable. Her ADHD is stable and she would like to continue on Ritalin.    Review of Systems  Constitutional: Negative.   Respiratory: Negative.    Cardiovascular: Negative.   Neurological: Negative.       Objective:   Physical Exam Constitutional:      Appearance: Normal appearance.  Cardiovascular:     Rate and Rhythm: Normal rate and regular rhythm.     Pulses: Normal pulses.     Heart sounds: Normal heart sounds.  Pulmonary:     Effort: Pulmonary effort is normal.     Breath sounds: Normal breath sounds.  Neurological:     General: No focal deficit present.     Mental Status: She is alert. Mental status is at baseline.          Assessment & Plan:  Her HTN and ADHD are stable. Medications were refilled.  Gershon Crane, MD

## 2020-12-12 ENCOUNTER — Encounter: Payer: Self-pay | Admitting: Family Medicine

## 2020-12-13 NOTE — Telephone Encounter (Signed)
She will need a virtual OV with me to fill this out

## 2020-12-14 ENCOUNTER — Telehealth: Payer: Medicaid Other | Admitting: Family Medicine

## 2020-12-14 ENCOUNTER — Encounter: Payer: Self-pay | Admitting: Family Medicine

## 2020-12-20 ENCOUNTER — Telehealth (INDEPENDENT_AMBULATORY_CARE_PROVIDER_SITE_OTHER): Payer: Medicaid Other | Admitting: Family Medicine

## 2020-12-20 ENCOUNTER — Encounter: Payer: Self-pay | Admitting: Family Medicine

## 2020-12-20 ENCOUNTER — Telehealth: Payer: Self-pay

## 2020-12-20 DIAGNOSIS — U071 COVID-19: Secondary | ICD-10-CM | POA: Diagnosis not present

## 2020-12-20 NOTE — Telephone Encounter (Signed)
Pt disability forms have been faxed  to [facility] as requested by pt. A copy of the form was mailed out to pt address and the original sent to scanning

## 2020-12-20 NOTE — Progress Notes (Signed)
Subjective:    Patient ID: Diana Nelson, female    DOB: Sep 20, 1980, 40 y.o.   MRN: [id number]  HPI Virtual Visit via Video Note  I connected with the patient on 12/20/20 at  2:45 PM EDT by a video enabled telemedicine application and verified that I am speaking with the correct person using two identifiers.  Location patient: home Location provider:work or home office Persons participating in the virtual visit: patient, provider  I discussed the limitations of evaluation and management by telemedicine and the availability of in person appointments. The patient expressed understanding and agreed to proceed.   HPI: Here for a Covid-19 infection. About 2 weeks ago she developed fever, body aches and a dry cough. Several of her children had been sick and they had tested positive for the Covid virus. Then on 12-09-20 [facility] tested positive as well. She feels much better now. She asks [place] to fill out a short term disability form. She has been out of work since 12-02-20.    ROS: See pertinent positives and negatives per HPI.  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   Eczema    GERD (gastroesophageal reflux disease)    Hypertension    Migraines    PONV (postoperative nausea and vomiting)     Past Surgical History:  Procedure Laterality Date   knee surgeries     x3   SPINAL FUSION     2006    TONSILLECTOMY      Family History  Problem Relation Age of Onset   Birth defects Daughter        pectus excavatum surgery at age 30   Diabetes Mother      Current Outpatient Medications:    albuterol (PROAIR HFA) 108 (90 Base) MCG/ACT inhaler, INHALE 2 PUFFS BY MOUTH EVERY 4 HOURS AS NEEDED FOR WHEEZE OR FOR SHORTNESS OF BREATH, Disp: 8.5 each, Rfl: 2   ALPRAZolam (XANAX) 1 MG tablet, TAKE 1 TABLET BY MOUTH THREE TIMES A DAY AS NEEDED FOR ANXIETY, Disp: 90 tablet, Rfl: 5    Cholecalciferol (VITAMIN D) 50 MCG (2000 UT) CAPS, Take 2,000 Units by mouth daily., Disp: , Rfl:    clindamycin (CLEOCIN T) 1 % SWAB, APPLY TO AFFECTED AREA TWICE A DAY, Disp: 60 each, Rfl: 5   fluticasone (FLONASE) 50 MCG/ACT nasal spray, Place 2 sprays into both nostrils daily. (Patient taking differently: Place 2 sprays into both nostrils daily as needed for allergies.), Disp: 16 g, Rfl: 6   ketoconazole (NIZORAL) 2 % cream, APPLY TO AFFECTED AREA TWICE A DAY, Disp: 15 g, Rfl: 5   lisinopril-hydrochlorothiazide (ZESTORETIC) 10-12.5 MG tablet, Take 1 tablet by mouth daily., Disp: 90 tablet, Rfl: 3   loratadine (CLARITIN) 10 MG tablet, Take 10 mg by mouth daily., Disp: , Rfl:    meloxicam (MOBIC) 15 MG tablet, Take 1 tablet (15 mg total) by mouth daily., Disp: 90 tablet, Rfl: 3   [START ON 01/10/2021] methylphenidate (RITALIN) 20 MG tablet, Take 1 tablet (20 mg total) by mouth 3 (three) times daily with meals., Disp: 90 tablet, Rfl: 0   norethindrone (ORTHO MICRONOR) 0.35 MG tablet, Take 1 tablet (0.35 mg total) by mouth daily., Disp: 1 Package, Rfl: 11   omeprazole (PRILOSEC) 40 MG capsule, TAKE 1 CAPSULE BY  MOUTH TWICE A DAY, Disp: 60 capsule, Rfl: 1   rizatriptan (MAXALT) 10 MG tablet, TAKE 1 TABLET BY MOUTH AS NEEDED FOR MIGRAINE. MAY REPEAT IN 2 HOURS IF NEEDED, Disp: 10 tablet, Rfl: 3   Spacer/Aero-Holding Chambers (OPTICHAMBER DIAMOND) MISC, Use with inhaler, Disp: 1 each, Rfl: 1   terbinafine (LAMISIL) 250 MG tablet, Take 1 tablet (250 mg total) by mouth daily., Disp: 90 tablet, Rfl: 1  EXAM:  VITALS per patient if applicable:  GENERAL: alert, oriented, appears well and in no acute distress  HEENT: atraumatic, conjunttiva clear, no obvious abnormalities on inspection of external nose and ears  NECK: normal movements of the head and neck  LUNGS: on inspection no signs of respiratory distress, breathing rate appears normal, no obvious gross SOB, gasping or wheezing  CV: no obvious  cyanosis  MS: moves all visible extremities without noticeable abnormality  PSYCH/NEURO: pleasant and cooperative, no obvious depression or anxiety, speech and thought processing grossly intact  ASSESSMENT AND PLAN: Covid-19 infection, now resolved. The forms were filled out. She will return to work tomorrow.  Gershon Crane, MD  Discussed the following assessment and plan:  No diagnosis found.     I discussed the assessment and treatment plan with the patient. The patient was provided an opportunity to ask questions and all were answered. The patient agreed with the plan and demonstrated an understanding of the instructions.   The patient was advised to call back or seek an in-person evaluation if the symptoms worsen or if the condition fails to improve as anticipated.      Review of Systems     Objective:   Physical Exam        Assessment & Plan:

## 2021-01-22 ENCOUNTER — Other Ambulatory Visit: Payer: Self-pay | Admitting: Family Medicine

## 2021-01-27 ENCOUNTER — Other Ambulatory Visit: Payer: Self-pay | Admitting: Family Medicine

## 2021-01-29 MED ORDER — METHYLPHENIDATE HCL 20 MG PO TABS: 20.0000 mg | ORAL_TABLET | Freq: Three times a day (TID) | ORAL | 0 refills | Status: DC

## 2021-01-29 NOTE — Telephone Encounter (Signed)
Done

## 2021-01-29 NOTE — Telephone Encounter (Signed)
Pt LOV was on 12/20/2020 Last refill done 01/10/2021 Please advise

## 2021-02-05 ENCOUNTER — Encounter: Payer: Self-pay | Admitting: Family Medicine

## 2021-02-06 NOTE — Telephone Encounter (Signed)
I downloaded the form. Please set up a virtual OV so we can fill it out together

## 2021-02-22 ENCOUNTER — Other Ambulatory Visit: Payer: Self-pay | Admitting: Family Medicine

## 2021-04-03 ENCOUNTER — Other Ambulatory Visit: Payer: Self-pay | Admitting: Family Medicine

## 2021-04-11 ENCOUNTER — Telehealth (INDEPENDENT_AMBULATORY_CARE_PROVIDER_SITE_OTHER): Payer: Medicaid Other | Admitting: Family Medicine

## 2021-04-11 ENCOUNTER — Encounter: Payer: Self-pay | Admitting: Family Medicine

## 2021-04-11 DIAGNOSIS — J111 Influenza due to unidentified influenza virus with other respiratory manifestations: Secondary | ICD-10-CM

## 2021-04-11 MED ORDER — HYDROCODONE BIT-HOMATROP MBR 5-1.5 MG/5ML PO SOLN: 5.0000 mL | Freq: Four times a day (QID) | ORAL | 0 refills | Status: DC | PRN

## 2021-04-11 MED ORDER — ONDANSETRON 8 MG PO TBDP: 8.0000 mg | ORAL_TABLET | Freq: Three times a day (TID) | ORAL | 0 refills | Status: DC | PRN

## 2021-04-11 MED ORDER — OSELTAMIVIR PHOSPHATE 75 MG PO CAPS: 75.0000 mg | ORAL_CAPSULE | Freq: Two times a day (BID) | ORAL | 0 refills | Status: DC

## 2021-04-11 NOTE — Telephone Encounter (Signed)
Note done

## 2021-04-11 NOTE — Progress Notes (Signed)
Patient ID: Diana Nelson, female   DOB: 1981/02/09, 40 y.o.   MRN: [id number]  This visit type was conducted due to national recommendations for restrictions regarding the COVID-19 pandemic in an effort to limit this patient's exposure and mitigate transmission in our community.   Virtual Visit via Video Note  I connected with Diana Nelson on 04/11/21 at 10:00 AM EST by a video enabled telemedicine application and verified that I am speaking with the correct person using two identifiers.  Location patient: home Location provider:work or home office Persons participating in the virtual visit: patient, provider  I discussed the limitations of evaluation and management by telemedicine and the availability of in person appointments. The patient expressed understanding and agreed to proceed.   HPI:  Diana Nelson calls with onset about 2 days ago of flulike illness.  Her son just tested positive for flu and is now Tamiflu.  She had fairly acute onset of symptoms including body aches, malaise, headache, fever up to 101, nausea without vomiting, sore throat.  She is concerned that she has fluid this time.  Her home COVID test was negative.  Cough is severe at night and not relieved with over-the-counter medications such as NyQuil and DayQuil.   ROS: See pertinent positives and negatives per HPI.  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   Eczema    GERD (gastroesophageal reflux disease)    Hypertension    Migraines    PONV (postoperative nausea and vomiting)     Past Surgical History:  Procedure Laterality Date   knee surgeries     x3   SPINAL FUSION     2006    TONSILLECTOMY      Family History  Problem Relation Age of Onset   Birth defects Daughter        pectus excavatum surgery at age 13   Diabetes Mother     SOCIAL HX: Past history of smoking   Current Outpatient  Medications:    HYDROcodone bit-homatropine (HYCODAN) 5-1.5 MG/5ML syrup, Take 5 mLs by mouth every 6 (six) hours as needed for cough., Disp: 120 mL, Rfl: 0   ondansetron (ZOFRAN-ODT) 8 MG disintegrating tablet, Take 1 tablet (8 mg total) by mouth every 8 (eight) hours as needed for nausea or vomiting., Disp: 12 tablet, Rfl: 0   oseltamivir (TAMIFLU) 75 MG capsule, Take 1 capsule (75 mg total) by mouth 2 (two) times daily., Disp: 10 capsule, Rfl: 0   albuterol (PROAIR HFA) 108 (90 Base) MCG/ACT inhaler, INHALE 2 PUFFS BY MOUTH EVERY 4 HOURS AS NEEDED FOR WHEEZE OR FOR SHORTNESS OF BREATH, Disp: 8.5 each, Rfl: 2   ALPRAZolam (XANAX) 1 MG tablet, TAKE 1 TABLET BY MOUTH THREE TIMES A DAY AS NEEDED FOR ANXIETY, Disp: 90 tablet, Rfl: 5   Cholecalciferol (VITAMIN D) 50 MCG (2000 UT) CAPS, Take 2,000 Units by mouth daily., Disp: , Rfl:    clindamycin (CLEOCIN T) 1 % SWAB, APPLY TO AFFECTED AREA TWICE A DAY, Disp: 60 each, Rfl: 5   fluticasone (FLONASE) 50 MCG/ACT nasal spray, Place 2 sprays into both nostrils daily. (Patient taking differently: Place 2 sprays into both nostrils daily as needed for allergies.), Disp: 16 g, Rfl: 6   ketoconazole (NIZORAL) 2 % cream, APPLY TO AFFECTED AREA TWICE  A DAY, Disp: 15 g, Rfl: 5   lisinopril-hydrochlorothiazide (ZESTORETIC) 10-12.5 MG tablet, TAKE 1 TABLET BY MOUTH EVERY DAY, Disp: 90 tablet, Rfl: 3   loratadine (CLARITIN) 10 MG tablet, Take 10 mg by mouth daily., Disp: , Rfl:    meloxicam (MOBIC) 15 MG tablet, Take 1 tablet (15 mg total) by mouth daily., Disp: 90 tablet, Rfl: 3   methylphenidate (RITALIN) 20 MG tablet, Take 1 tablet (20 mg total) by mouth 3 (three) times daily with meals., Disp: 90 tablet, Rfl: 0   norethindrone (ORTHO MICRONOR) 0.35 MG tablet, Take 1 tablet (0.35 mg total) by mouth daily., Disp: 1 Package, Rfl: 11   omeprazole (PRILOSEC) 40 MG capsule, TAKE 1 CAPSULE BY MOUTH TWICE A DAY, Disp: 60 capsule, Rfl: 1   rizatriptan (MAXALT) 10 MG tablet,  TAKE 1 TABLET BY MOUTH AS NEEDED FOR MIGRAINE. MAY REPEAT IN 2 HOURS IF NEEDED, Disp: 10 tablet, Rfl: 2   Spacer/Aero-Holding Chambers [facility] DIAMOND) MISC, Use with inhaler, Disp: 1 each, Rfl: 1   terbinafine (LAMISIL) 250 MG tablet, Take 1 tablet (250 mg total) by mouth daily., Disp: 90 tablet, Rfl: 1  EXAM:  VITALS per patient if applicable:  GENERAL: alert, oriented, appears well and in no acute distress  HEENT: atraumatic, conjunttiva clear, no obvious abnormalities on inspection of external nose and ears  NECK: normal movements of the head and neck  LUNGS: on inspection no signs of respiratory distress, breathing rate appears normal, no obvious gross SOB, gasping or wheezing  CV: no obvious cyanosis  MS: moves all visible extremities without noticeable abnormality  PSYCH/NEURO: pleasant and cooperative, no obvious depression or anxiety, speech and thought processing grossly intact  ASSESSMENT AND PLAN:  Discussed the following assessment and plan:  Influenza-like illness very likely influenza.  Home COVID test negative.  Son tested positive for influenza yesterday.  Start Tamiflu 75 mg by mouth twice daily for 5 days.  Zofran 8 mg ODT 1 every 8 hours as needed for nausea.  Hycodan cough syrup 1 teaspoon every 6 hours as needed for severe cough.  She has tolerated this well in the past.  Plenty fluids and rest.  Follow-up for any persistent or worsening symptoms.     I discussed the assessment and treatment plan with the patient. The patient was provided an opportunity to ask questions and all were answered. The patient agreed with the plan and demonstrated an understanding of the instructions.   The patient was advised to call back or seek an in-person evaluation if the symptoms worsen or if the condition fails to improve as anticipated.     [name], MD

## 2021-04-16 ENCOUNTER — Encounter: Payer: Self-pay | Admitting: Family Medicine

## 2021-04-16 MED ORDER — CEFDINIR 300 MG PO CAPS: 300.0000 mg | ORAL_CAPSULE | Freq: Two times a day (BID) | ORAL | 0 refills | Status: DC

## 2021-04-16 MED ORDER — HYDROCODONE BIT-HOMATROP MBR 5-1.5 MG/5ML PO SOLN: 5.0000 mL | Freq: Four times a day (QID) | ORAL | 0 refills | Status: DC | PRN

## 2021-04-16 NOTE — Telephone Encounter (Signed)
I sent in one refill of the cough syrup and also Omnicef for one week.    Recommend follow up with Dr Clent Ridges if not better after that.

## 2021-04-17 ENCOUNTER — Ambulatory Visit: Payer: Medicaid Other

## 2021-04-19 ENCOUNTER — Other Ambulatory Visit: Payer: Self-pay | Admitting: Family Medicine

## 2021-04-20 NOTE — Telephone Encounter (Signed)
Pt was seen by Dr. Caryl Never by Video Visit on 04/11/21.   Zofran-ODT 8mg  was sent to pharmacy on that day.     Should patient continue on this medication?   Please advise

## 2021-04-23 ENCOUNTER — Other Ambulatory Visit: Payer: Self-pay | Admitting: Family Medicine

## 2021-04-24 ENCOUNTER — Other Ambulatory Visit: Payer: Self-pay | Admitting: Family Medicine

## 2021-04-25 MED ORDER — METHYLPHENIDATE HCL 20 MG PO TABS: 20.0000 mg | ORAL_TABLET | Freq: Three times a day (TID) | ORAL | 0 refills | Status: DC

## 2021-04-25 NOTE — Telephone Encounter (Signed)
Per Zella Ball at [facility] pharmacy patient is due to refill on 05/09/21

## 2021-04-25 NOTE — Telephone Encounter (Signed)
Done

## 2021-05-01 ENCOUNTER — Telehealth (INDEPENDENT_AMBULATORY_CARE_PROVIDER_SITE_OTHER): Payer: Medicaid Other | Admitting: Family Medicine

## 2021-05-01 DIAGNOSIS — R6889 Other general symptoms and signs: Secondary | ICD-10-CM | POA: Diagnosis not present

## 2021-05-01 MED ORDER — PROMETHAZINE-DM 6.25-15 MG/5ML PO SYRP: 5.0000 mL | ORAL_SOLUTION | Freq: Four times a day (QID) | ORAL | 0 refills | Status: DC | PRN

## 2021-05-01 MED ORDER — ONDANSETRON HCL 4 MG PO TABS: 4.0000 mg | ORAL_TABLET | Freq: Three times a day (TID) | ORAL | 0 refills | Status: DC | PRN

## 2021-05-01 NOTE — Progress Notes (Signed)
Virtual Visit via Video Note  I connected with Diana Nelson  on 05/01/21 at  4:00 PM EST by a video enabled telemedicine application and verified that I am speaking with the correct person using two identifiers.  Location patient:[place] Location provider:work or home office Persons participating in the virtual visit: patient, provider  I discussed the limitations of evaluation and management by telemedicine and the availability of in person appointments. The patient expressed understanding and agreed to proceed.   HPI:  Acute telemedicine visit for flu like symptoms: -Onset: 2-3 days -Symptoms include:cough, fevers, sinus congestion, sore throat, HA, nausea -wants something for nausea and cough syrup -Denies: CP, SOB, vomiting, diarrhea, inability to eat/drink -child is sick currently -Pertinent past medical history: had flu and a sinus infection last month which she reports clear up after taking abx  -Pertinent medication allergies:  Allergies  Allergen Reactions   Bactrim [Sulfamethoxazole-Trimethoprim] Rash   Azithromycin Nausea Only   Lamictal [Lamotrigine] Rash  -COVID-19 vaccine status: Immunization History  Administered Date(s) Administered   Influenza Whole 03/23/2008   Influenza,inj,Quad PF,6+ Mos 02/26/2013, 02/25/2020   Influenza-Unspecified 02/15/2014, 02/27/2015, 03/19/2017   PFIZER(Purple Top)SARS-COV-2 Vaccination 08/20/2019, 09/15/2019, 04/21/2020     ROS: See pertinent positives and negatives per HPI.  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   Eczema    GERD (gastroesophageal reflux disease)    Hypertension    Migraines    PONV (postoperative nausea and vomiting)     Past Surgical History:  Procedure Laterality Date   knee surgeries     x3   SPINAL FUSION     2006    TONSILLECTOMY       Current Outpatient Medications:    ondansetron  (ZOFRAN) 4 MG tablet, Take 1 tablet (4 mg total) by mouth every 8 (eight) hours as needed for nausea or vomiting., Disp: 20 tablet, Rfl: 0   promethazine-dextromethorphan (PROMETHAZINE-DM) 6.25-15 MG/5ML syrup, Take 5 mLs by mouth 4 (four) times daily as needed for cough., Disp: 118 mL, Rfl: 0   albuterol (PROAIR HFA) 108 (90 Base) MCG/ACT inhaler, INHALE 2 PUFFS BY MOUTH EVERY 4 HOURS AS NEEDED FOR WHEEZE OR FOR SHORTNESS OF BREATH, Disp: 8.5 each, Rfl: 2   ALPRAZolam (XANAX) 1 MG tablet, TAKE 1 TABLET BY MOUTH THREE TIMES A DAY AS NEEDED FOR ANXIETY, Disp: 90 tablet, Rfl: 5   cefdinir (OMNICEF) 300 MG capsule, Take 1 capsule (300 mg total) by mouth 2 (two) times daily., Disp: 14 capsule, Rfl: 0   Cholecalciferol (VITAMIN D) 50 MCG (2000 UT) CAPS, Take 2,000 Units by mouth daily., Disp: , Rfl:    clindamycin (CLEOCIN T) 1 % SWAB, APPLY TO AFFECTED AREA TWICE A DAY, Disp: 60 each, Rfl: 5   fluticasone (FLONASE) 50 MCG/ACT nasal spray, Place 2 sprays into both nostrils daily. (Patient taking differently: Place 2 sprays into both nostrils daily as needed for allergies.), Disp: 16 g, Rfl: 6   ketoconazole (NIZORAL) 2 % cream, APPLY TO AFFECTED AREA TWICE A DAY, Disp: 15 g, Rfl: 5   lisinopril-hydrochlorothiazide (ZESTORETIC) 10-12.5 MG tablet, TAKE 1 TABLET BY MOUTH EVERY DAY, Disp: 90 tablet, Rfl: 3   loratadine (CLARITIN) 10 MG tablet, Take 10 mg by mouth daily., Disp: , Rfl:    meloxicam (MOBIC) 15 MG tablet, Take  1 tablet (15 mg total) by mouth daily., Disp: 90 tablet, Rfl: 3   [START ON 06/26/2021] methylphenidate (RITALIN) 20 MG tablet, Take 1 tablet (20 mg total) by mouth 3 (three) times daily with meals., Disp: 90 tablet, Rfl: 0   norethindrone (ORTHO MICRONOR) 0.35 MG tablet, Take 1 tablet (0.35 mg total) by mouth daily., Disp: 1 Package, Rfl: 11   omeprazole (PRILOSEC) 40 MG capsule, TAKE 1 CAPSULE BY MOUTH TWICE A DAY, Disp: 60 capsule, Rfl: 1   oseltamivir (TAMIFLU) 75 MG capsule, Take 1  capsule (75 mg total) by mouth 2 (two) times daily., Disp: 10 capsule, Rfl: 0   rizatriptan (MAXALT) 10 MG tablet, TAKE 1 TABLET BY MOUTH AS NEEDED FOR MIGRAINE. MAY REPEAT IN 2 HOURS IF NEEDED, Disp: 10 tablet, Rfl: 2   Spacer/Aero-Holding Chambers [facility] DIAMOND) MISC, Use with inhaler, Disp: 1 each, Rfl: 1   terbinafine (LAMISIL) 250 MG tablet, Take 1 tablet (250 mg total) by mouth daily., Disp: 90 tablet, Rfl: 1  EXAM:  VITALS per patient if applicable:  GENERAL: alert, oriented, appears well and in no acute distress  HEENT: atraumatic, conjunttiva clear, no obvious abnormalities on inspection of external nose and ears  NECK: normal movements of the head and neck  LUNGS: on inspection no signs of respiratory distress, breathing rate appears normal, no obvious gross SOB, gasping or wheezing  CV: no obvious cyanosis  MS: moves all visible extremities without noticeable abnormality  PSYCH/NEURO: pleasant and cooperative, no obvious depression or anxiety, speech and thought processing grossly intact  ASSESSMENT AND PLAN:  Discussed the following assessment and plan:  Flu-like symptoms  -we discussed possible serious and likely etiologies, options for evaluation and workup, limitations of telemedicine visit vs in person visit, treatment, treatment risks and precautions. Pt is agreeable to treatment via telemedicine at this moment.  Query [phone] (seeing and increase in cases with high community transmission level currently) vs another viral resp illness. She has opted for nasal saline, short course nasal decongestant, cough syrup and zofran for nausea with other symptomatic care measures per patient instructions. Agrees to home covid testing and is aware [facility] for [facility] if positive.  Advised to seek prompt follow up VV or in person care though PCP office if worsening, new symptoms arise, or if is not improving with treatment.   I discussed the assessment  and treatment plan with the patient. The patient was provided an opportunity to ask questions and all were answered. The patient agreed with the plan and demonstrated an understanding of the instructions.     Terressa Koyanagi, DO

## 2021-05-01 NOTE — Patient Instructions (Addendum)
°  HOME CARE TIPS:  -COVID19 testing information: GoldAgenda.is  Most pharmacies also offer testing and home test kits. If the Covid19 test is positive and you desire antiviral treatment, please contact a [facility] pharmacy or schedule a follow up virtual visit through your primary care office or through the [employer].  Other test to treat options: http://www.vasquez-vaughn.[email or web address]_source=alert  -I sent the medication(s) we discussed to your pharmacy: Meds ordered this encounter  Medications   ondansetron (ZOFRAN) 4 MG tablet    Sig: Take 1 tablet (4 mg total) by mouth every 8 (eight) hours as needed for nausea or vomiting.    Dispense:  20 tablet    Refill:  0   promethazine-dextromethorphan (PROMETHAZINE-DM) 6.25-15 MG/5ML syrup    Sig: Take 5 mLs by mouth 4 (four) times daily as needed for cough.    Dispense:  118 mL    Refill:  0    -can use tylenol or aleve if needed for fevers, aches and pains per instructions  -can use nasal saline a few times per day if you have nasal congestion; sometimes  a short course of Afrin nasal spray for 3 days can help with symptoms as well  -stay hydrated, drink plenty of fluids and eat small healthy meals - avoid dairy  -can take 1000 IU ( ) Vit D3 and 100-500 mg of Vit C daily per instructions  -If the Covid test is positive, check out the [facility] website for more information on home care, transmission and treatment for COVID19  -follow up with your doctor in 2-3 days unless improving and feeling better  -stay home while sick, except to seek medical care. If you have COVID19, you will likely be contagious for 7-10 days. Flu or Influenza is likely contagious for about 7 days. Other respiratory viral infections remain contagious for 5-10+ days depending on the virus and many other factors. Wear a good mask that fits snugly (such as N95 or KN95) if around others to reduce the risk of transmission.  It  was nice to meet you today, and I really hope you are feeling better soon. I help [facility] out with telemedicine visits on Tuesdays and Thursdays and am happy to help if you need a follow up virtual visit on those days. Otherwise, if you have any concerns or questions following this visit please schedule a follow up visit with your Primary Care doctor or seek care at a local urgent care clinic to avoid delays in care.    Seek in person care or schedule a follow up video visit promptly if your symptoms worsen, new concerns arise or you are not improving with treatment. Call 911 and/or seek emergency care if your symptoms are severe or life threatening.

## 2021-05-16 ENCOUNTER — Other Ambulatory Visit: Payer: Self-pay | Admitting: Family Medicine

## 2021-05-22 ENCOUNTER — Other Ambulatory Visit: Payer: Self-pay | Admitting: Family Medicine

## 2021-05-25 ENCOUNTER — Other Ambulatory Visit: Payer: Self-pay

## 2021-05-25 ENCOUNTER — Telehealth: Payer: Self-pay | Admitting: Family Medicine

## 2021-05-25 MED ORDER — ONDANSETRON HCL 4 MG PO TABS: 4.0000 mg | ORAL_TABLET | Freq: Three times a day (TID) | ORAL | 0 refills | Status: DC | PRN

## 2021-05-25 NOTE — Telephone Encounter (Signed)
Patient is requesting something for nausea and throwing up.

## 2021-05-25 NOTE — Telephone Encounter (Signed)
Refill for Zofran sent to [facility] [address].   [facility] will notify patient when ready for pick up.

## 2021-05-28 ENCOUNTER — Encounter: Payer: Self-pay | Admitting: Family Medicine

## 2021-05-28 ENCOUNTER — Telehealth (INDEPENDENT_AMBULATORY_CARE_PROVIDER_SITE_OTHER): Payer: Medicaid Other | Admitting: Family Medicine

## 2021-05-28 DIAGNOSIS — K529 Noninfective gastroenteritis and colitis, unspecified: Secondary | ICD-10-CM | POA: Diagnosis not present

## 2021-05-28 NOTE — Progress Notes (Signed)
Subjective:    Patient ID: Diana Nelson, female    DOB: [birth year], 41 y.o.   MRN: [id number]  HPI Virtual Visit via Video Note  I connected with the patient on 05/28/21 at  2:00 PM EST by a video enabled telemedicine application and verified that I am speaking with the correct person using two identifiers.  Location patient: home Location provider:work or home office Persons participating in the virtual visit: patient, provider  I discussed the limitations of evaluation and management by telemedicine and the availability of in person appointments. The patient expressed understanding and agreed to proceed.   HPI: Here to discuss a GI virus that she got 4 days ago. Over the next 3 days she had nausea with vomiting and mild abdominal cramps. No fever or diarrhea. No recent travelling. She is using Zofran and drinking water and Pedialyte. Today she feels much better. She asks for a work note to cover her for a few days last week.    ROS: See pertinent positives and negatives per HPI.  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   Eczema    GERD (gastroesophageal reflux disease)    Hypertension    Migraines    PONV (postoperative nausea and vomiting)     Past Surgical History:  Procedure Laterality Date   knee surgeries     x3   SPINAL FUSION     2006    TONSILLECTOMY      Family History  Problem Relation Age of Onset   Birth defects Daughter        pectus excavatum surgery at age 41   Diabetes Mother      Current Outpatient Medications:    albuterol (PROAIR HFA) 108 (90 Base) MCG/ACT inhaler, INHALE 2 PUFFS BY MOUTH EVERY 4 HOURS AS NEEDED FOR WHEEZE OR FOR SHORTNESS OF BREATH, Disp: 8.5 each, Rfl: 2   ALPRAZolam (XANAX) 1 MG tablet, TAKE 1 TABLET BY MOUTH THREE TIMES A DAY AS NEEDED FOR ANXIETY, Disp: 90 tablet, Rfl: 5   cefdinir (OMNICEF) 300 MG capsule,  Take 1 capsule (300 mg total) by mouth 2 (two) times daily., Disp: 14 capsule, Rfl: 0   Cholecalciferol (VITAMIN D) 50 MCG (2000 UT) CAPS, Take 2,000 Units by mouth daily., Disp: , Rfl:    clindamycin (CLEOCIN T) 1 % SWAB, APPLY TO AFFECTED AREA TWICE A DAY, Disp: 60 each, Rfl: 5   fluticasone (FLONASE) 50 MCG/ACT nasal spray, Place 2 sprays into both nostrils daily. (Patient taking differently: Place 2 sprays into both nostrils daily as needed for allergies.), Disp: 16 g, Rfl: 6   ketoconazole (NIZORAL) 2 % cream, APPLY TO AFFECTED AREA TWICE A DAY, Disp: 15 g, Rfl: 5   lisinopril-hydrochlorothiazide (ZESTORETIC) 10-12.5 MG tablet, TAKE 1 TABLET BY MOUTH EVERY DAY, Disp: 90 tablet, Rfl: 3   loratadine (CLARITIN) 10 MG tablet, Take 10 mg by mouth daily., Disp: , Rfl:    meloxicam (MOBIC) 15 MG tablet, Take 1 tablet (15 mg total) by mouth daily., Disp: 90 tablet, Rfl: 3   [START ON 06/26/2021] methylphenidate (RITALIN) 20 MG tablet, Take 1 tablet (20 mg total) by mouth 3 (three) times daily with meals., Disp: 90 tablet, Rfl: 0   norethindrone (ORTHO MICRONOR) 0.35 MG tablet, Take 1 tablet (0.35  mg total) by mouth daily., Disp: 1 Package, Rfl: 11   omeprazole (PRILOSEC) 40 MG capsule, TAKE 1 CAPSULE BY MOUTH TWICE A DAY, Disp: 60 capsule, Rfl: 1   ondansetron (ZOFRAN) 4 MG tablet, Take 1 tablet (4 mg total) by mouth every 8 (eight) hours as needed for nausea or vomiting., Disp: 20 tablet, Rfl: 0   promethazine-dextromethorphan (PROMETHAZINE-DM) 6.25-15 MG/5ML syrup, Take 5 mLs by mouth 4 (four) times daily as needed for cough., Disp: 118 mL, Rfl: 0   rizatriptan (MAXALT) 10 MG tablet, TAKE 1 TABLET BY MOUTH AS NEEDED FOR MIGRAINE. MAY REPEAT IN 2 HOURS IF NEEDED, Disp: 10 tablet, Rfl: 2   Spacer/Aero-Holding Chambers [facility] DIAMOND) MISC, Use with inhaler, Disp: 1 each, Rfl: 1   terbinafine (LAMISIL) 250 MG tablet, Take 1 tablet (250 mg total) by mouth daily., Disp: 90 tablet, Rfl:  1  EXAM:  VITALS per patient if applicable:  GENERAL: alert, oriented, appears well and in no acute distress  HEENT: atraumatic, conjunttiva clear, no obvious abnormalities on inspection of external nose and ears  NECK: normal movements of the head and neck  LUNGS: on inspection no signs of respiratory distress, breathing rate appears normal, no obvious gross SOB, gasping or wheezing  CV: no obvious cyanosis  MS: moves all visible extremities without noticeable abnormality  PSYCH/NEURO: pleasant and cooperative, no obvious depression or anxiety, speech and thought processing grossly intact  ASSESSMENT AND PLAN: Viral enteritis. We will excuse her from work as above. Recheck as needed.  Gershon Crane, MD  Discussed the following assessment and plan:  No diagnosis found.     I discussed the assessment and treatment plan with the patient. The patient was provided an opportunity to ask questions and all were answered. The patient agreed with the plan and demonstrated an understanding of the instructions.   The patient was advised to call back or seek an in-person evaluation if the symptoms worsen or if the condition fails to improve as anticipated.      Review of Systems     Objective:   Physical Exam        Assessment & Plan:

## 2021-06-07 ENCOUNTER — Other Ambulatory Visit: Payer: Self-pay | Admitting: Family Medicine

## 2021-06-29 ENCOUNTER — Other Ambulatory Visit: Payer: Self-pay | Admitting: Family Medicine

## 2021-06-29 DIAGNOSIS — M545 Low back pain, unspecified: Secondary | ICD-10-CM

## 2021-07-13 ENCOUNTER — Other Ambulatory Visit: Payer: Self-pay | Admitting: Family Medicine

## 2021-07-24 ENCOUNTER — Other Ambulatory Visit: Payer: Self-pay | Admitting: Family Medicine

## 2021-07-25 MED ORDER — METHYLPHENIDATE HCL 20 MG PO TABS: 20.0000 mg | ORAL_TABLET | Freq: Three times a day (TID) | ORAL | 0 refills | Status: DC

## 2021-07-25 NOTE — Telephone Encounter (Signed)
Done

## 2021-07-25 NOTE — Telephone Encounter (Signed)
Last VV- 05/28/2021 ?Last refill- 90 tabs, 0 refills ? ?No future OV scheduled ? ? ?

## 2021-07-30 ENCOUNTER — Other Ambulatory Visit: Payer: Self-pay | Admitting: Family Medicine

## 2021-07-31 NOTE — Telephone Encounter (Signed)
Last refill- 08/15/20 ?Last VV- 05/28/2021 ? ?No future OV scheduled. Can this patient receive a refill? ?

## 2021-08-17 ENCOUNTER — Other Ambulatory Visit: Payer: Self-pay | Admitting: Family Medicine

## 2021-08-17 ENCOUNTER — Other Ambulatory Visit: Payer: Self-pay

## 2021-08-19 ENCOUNTER — Other Ambulatory Visit: Payer: Self-pay | Admitting: Family Medicine

## 2021-09-06 ENCOUNTER — Encounter: Payer: Self-pay | Admitting: Family Medicine

## 2021-09-07 ENCOUNTER — Emergency Department (HOSPITAL_COMMUNITY)
Admission: EM | Admit: 2021-09-07 | Discharge: 2021-09-07 | Disposition: A | Discharge status: Home / Self Care | Payer: Medicaid Other | Attending: Emergency Medicine | Admitting: Emergency Medicine

## 2021-09-07 ENCOUNTER — Other Ambulatory Visit: Payer: Self-pay

## 2021-09-07 DIAGNOSIS — S025XXA Fracture of tooth (traumatic), initial encounter for closed fracture: Secondary | ICD-10-CM | POA: Diagnosis not present

## 2021-09-07 DIAGNOSIS — K0889 Other specified disorders of teeth and supporting structures: Secondary | ICD-10-CM | POA: Diagnosis present

## 2021-09-07 DIAGNOSIS — X58XXXA Exposure to other specified factors, initial encounter: Secondary | ICD-10-CM | POA: Diagnosis not present

## 2021-09-07 MED ORDER — AMOXICILLIN 500 MG PO CAPS: 500.0000 mg | ORAL_CAPSULE | Freq: Three times a day (TID) | ORAL | 0 refills | Status: DC

## 2021-09-07 MED ORDER — IBUPROFEN 800 MG PO TABS: 800.0000 mg | ORAL_TABLET | Freq: Three times a day (TID) | ORAL | 0 refills | Status: DC

## 2021-09-07 MED ORDER — TRAMADOL HCL 50 MG PO TABS: 50.0000 mg | ORAL_TABLET | Freq: Four times a day (QID) | ORAL | 0 refills | Status: DC | PRN

## 2021-09-07 NOTE — ED Triage Notes (Signed)
Pt. Stated, I had a crown broke off on Wednesday and Its just worse like its exploded. ?

## 2021-09-07 NOTE — ED Provider Notes (Signed)
?Diana Nelson ?Provider Note ? ? ?CSN: [id number] ?Arrival date & time: 09/07/21  0700 ? ?  ? ?History ? ?Chief Complaint  ?Patient presents with  ? Dental Pain  ? ? ?Diana Nelson is a 41 y.o. female. ? ?The history is provided by the patient.  ?Dental Pain ?Location:  Lower ?Lower teeth location:  31/RL 2nd molar ?Quality:  Aching and throbbing ?Onset quality:  Sudden ?Duration:  1 day ?Timing:  Constant ?Progression:  Worsening ?Chronicity:  New ?Context: crown fracture   ?Relieved by:  Nothing ?Worsened by:  Pressure, jaw movement, touching, hot food/drink and cold food/drink ?Ineffective treatments:  Topical anesthetic gel, NSAIDs and acetaminophen ?Associated symptoms: no congestion, no difficulty swallowing, no drooling, no facial pain, no facial swelling, no fever, no gum swelling, no headaches, no neck pain, no neck swelling, no oral bleeding, no oral lesions and no trismus   ?Risk factors: sufficient dental care and no periodontal disease   ? ?  ? ?Home Medications ?Prior to Admission medications   ?Medication Sig Start Date End Date Taking? Authorizing Provider  ?amoxicillin (AMOXIL) 500 MG capsule Take 1 capsule (500 mg total) by mouth 3 (three) times daily. 09/07/21  Yes Tyrece Vanterpool, Vernie Shanks, PA-C  ?ibuprofen (ADVIL) 800 MG tablet Take 1 tablet (800 mg total) by mouth 3 (three) times daily. 09/07/21  Yes Margarita Mail, PA-C  ?traMADol (ULTRAM) 50 MG tablet Take 1 tablet (50 mg total) by mouth every 6 (six) hours as needed. 09/07/21  Yes Loxley Cibrian, PA-C  ?albuterol (VENTOLIN HFA) 108 (90 Base) MCG/ACT inhaler INHALE 2 PUFFS BY MOUTH EVERY 4 HOURS AS NEEDED FOR WHEEZE OR FOR SHORTNESS OF BREATH 08/20/21   Laurey Morale, MD  ?ALPRAZolam Duanne Moron) 1 MG tablet TAKE 1 TABLET BY MOUTH THREE TIMES A DAY AS NEEDED FOR ANXIETY 04/03/21   Laurey Morale, MD  ?cefdinir (OMNICEF) 300 MG capsule Take 1 capsule (300 mg total) by mouth 2 (two) times daily. 04/16/21   Burchette, Alinda Sierras, MD  ?Cholecalciferol (VITAMIN D) 50 MCG (2000 UT) CAPS Take 2,000 Units by mouth daily.    [provider]  ?clindamycin (CLEOCIN T) 1 % SWAB APPLY TO AFFECTED AREA TWICE A DAY 07/31/21   Laurey Morale, MD  ?fluticasone (FLONASE) 50 MCG/ACT nasal spray Place 2 sprays into both nostrils daily. ?Patient taking differently: Place 2 sprays into both nostrils daily as needed for allergies. 02/06/15   Laurey Morale, MD  ?ketoconazole (NIZORAL) 2 % cream APPLY TO AFFECTED AREA TWICE A DAY 01/24/21   Laurey Morale, MD  ?lisinopril-hydrochlorothiazide (ZESTORETIC) 10-12.5 MG tablet TAKE 1 TABLET BY MOUTH EVERY DAY 01/29/21   Laurey Morale, MD  ?loratadine (CLARITIN) 10 MG tablet Take 10 mg by mouth daily.    [provider]  ?methylphenidate (RITALIN) 20 MG tablet Take 1 tablet (20 mg total) by mouth 3 (three) times daily with meals. 09/24/21 10/24/21  Laurey Morale, MD  ?norethindrone (ORTHO MICRONOR) 0.35 MG tablet Take 1 tablet (0.35 mg total) by mouth daily. 04/07/19   Sanjuana Kava, MD  ?omeprazole (PRILOSEC) 40 MG capsule TAKE 1 CAPSULE BY MOUTH TWICE A DAY 08/17/21   Laurey Morale, MD  ?ondansetron (ZOFRAN) 4 MG tablet Take 1 tablet (4 mg total) by mouth every 8 (eight) hours as needed for nausea or vomiting. 05/25/21   Laurey Morale, MD  ?promethazine-dextromethorphan (PROMETHAZINE-DM) 6.25-15 MG/5ML syrup Take 5 mLs by mouth 4 (four) times daily  as needed for cough. 05/01/21   Lucretia Kern, DO  ?rizatriptan (MAXALT) 10 MG tablet TAKE 1 TABLET BY MOUTH AS NEEDED FOR MIGRAINE. MAY REPEAT IN 2 HOURS IF NEEDED 08/20/21   Laurey Morale, MD  ?Spacer/Aero-Holding [facility] DIAMOND) MISC Use with inhaler 12/02/17   Laurey Morale, MD  ?terbinafine (LAMISIL) 250 MG tablet TAKE 1 TABLET BY MOUTH EVERY DAY 06/29/21   Laurey Morale, MD  ?   ? ?Allergies    ?Bactrim [sulfamethoxazole-trimethoprim], Azithromycin, and Lamictal [lamotrigine]   ? ?Review of Systems   ?Review of Systems  ?Constitutional:   Negative for fever.  ?HENT:  Negative for congestion, drooling, facial swelling and mouth sores.   ?Musculoskeletal:  Negative for neck pain.  ?Neurological:  Negative for headaches.  ? ?Physical Exam ?Updated Vital Signs ?BP 128/90 (BP Location: Right Arm)   Pulse 98   Temp 98.7 ?F (37.1 ?C) (Oral)   Resp 20   LMP 08/27/2021   SpO2 100%  ?Physical Exam ?Vitals and nursing note reviewed.  ?Constitutional:   ?   General: She is not in acute distress. ?   Appearance: She is well-developed. She is not diaphoretic.  ?HENT:  ?   Head: Normocephalic and atraumatic.  ?   Right Ear: External ear normal.  ?   Left Ear: External ear normal.  ?   Nose: Nose normal.  ?   Mouth/Throat:  ?   Mouth: Mucous membranes are moist.  ? ?Eyes:  ?   General: No scleral icterus. ?   Extraocular Movements: Extraocular movements intact.  ?   Conjunctiva/sclera: Conjunctivae normal.  ?   Pupils: Pupils are equal, round, and reactive to light.  ?Cardiovascular:  ?   Rate and Rhythm: Normal rate and regular rhythm.  ?   Heart sounds: Normal heart sounds. No murmur heard. ?  No friction rub. No gallop.  ?Pulmonary:  ?   Effort: Pulmonary effort is normal. No respiratory distress.  ?   Breath sounds: Normal breath sounds.  ?Abdominal:  ?   General: Bowel sounds are normal. There is no distension.  ?   Palpations: Abdomen is soft. There is no mass.  ?   Tenderness: There is no abdominal tenderness. There is no guarding.  ?Musculoskeletal:  ?   Cervical back: Normal range of motion.  ?Skin: ?   General: Skin is warm and dry.  ?Neurological:  ?   Mental Status: She is alert and oriented to person, place, and time.  ?Psychiatric:     ?   Behavior: Behavior normal.  ? ? ?ED Results / Procedures / Treatments   ?Labs ?(all labs ordered are listed, but only abnormal results are displayed) ?Labs Reviewed - No data to display ? ?EKG ?None ? ?Radiology ?No results found. ? ?Procedures ?Procedures  ? ? ?Medications Ordered in ED ?Medications - No data  to display ? ?ED Course/ Medical Decision Making/ A&P ?  ?                        ?Medical Decision Making ?Risk ?Prescription drug management. ? ? ?Patient here with dental pain. ?PDMP reviewed during this encounter. ?Patient appears to have a loose/broken crown.  ?Will d/c with tramadol, ibuprofen and amoxil. ?No signs of abscess or Ludwig's angina. ?F/u closely with dentist/oral surgery ? ?Final Clinical Impression(s) / ED Diagnoses ?Final diagnoses:  ?Closed fracture of tooth, initial encounter  ? ? ?Rx / DC Orders ?  ED Discharge Orders   ? ?      Ordered  ?  amoxicillin (AMOXIL) 500 MG capsule  3 times daily       ? 09/07/21 0735  ?  traMADol (ULTRAM) 50 MG tablet  Every 6 hours PRN       ? 09/07/21 0735  ?  ibuprofen (ADVIL) 800 MG tablet  3 times daily       ? 09/07/21 0735  ? ?  ?  ? ?  ? ? ?  ?Margarita Mail, PA-C ?09/07/21 [id number] ? ?  ?Davonna Belling, MD ?09/08/21 1435 ? ?

## 2021-09-07 NOTE — Discharge Instructions (Addendum)

## 2021-09-10 ENCOUNTER — Telehealth: Payer: Self-pay

## 2021-09-10 NOTE — Telephone Encounter (Signed)
Transition Care Management Unsuccessful Follow-up Telephone Call ? ?Date of discharge and from where:  09/07/2021 from [facility] ? ?Attempts:  1st Attempt ? ?Reason for unsuccessful TCM follow-up call:  Unable to leave message ? ? ? ?

## 2021-09-11 NOTE — Telephone Encounter (Signed)
Transition Care Management Unsuccessful Follow-up Telephone Call ? ?Date of discharge and from where:  09/07/2021 from [facility] ? ?Attempts:  2nd Attempt ? ?Reason for unsuccessful TCM follow-up call:  Unable to leave message ? ? ? ?

## 2021-09-12 NOTE — Telephone Encounter (Signed)
Transition Care Management Unsuccessful Follow-up Telephone Call ? ?Date of discharge and from where:  09/07/2021-[facility] ? ?Attempts:  3rd Attempt ? ?Reason for unsuccessful TCM follow-up call:  Unable to leave message ? ?  ?

## 2021-09-20 ENCOUNTER — Other Ambulatory Visit: Payer: Self-pay | Admitting: Family Medicine

## 2021-09-20 DIAGNOSIS — M545 Low back pain, unspecified: Secondary | ICD-10-CM

## 2021-09-20 DIAGNOSIS — K219 Gastro-esophageal reflux disease without esophagitis: Secondary | ICD-10-CM

## 2021-09-22 ENCOUNTER — Other Ambulatory Visit: Payer: Self-pay | Admitting: Family Medicine

## 2021-09-24 ENCOUNTER — Other Ambulatory Visit: Payer: Self-pay | Admitting: Family Medicine

## 2021-09-24 NOTE — Telephone Encounter (Signed)
Rx sent to pt pharmacy today 

## 2021-09-24 NOTE — Telephone Encounter (Signed)
Pt LOV was on 05/28/2021 ?Last refill done on 04/03/2021 ?Please advise ?

## 2021-09-26 ENCOUNTER — Encounter: Payer: Self-pay | Admitting: Family Medicine

## 2021-09-27 MED ORDER — METHYLPHENIDATE HCL 20 MG PO TABS: 20.0000 mg | ORAL_TABLET | Freq: Three times a day (TID) | ORAL | 0 refills | Status: DC

## 2021-09-27 NOTE — Telephone Encounter (Signed)
Done

## 2021-10-18 ENCOUNTER — Other Ambulatory Visit: Payer: Self-pay | Admitting: Family Medicine

## 2021-10-18 DIAGNOSIS — K219 Gastro-esophageal reflux disease without esophagitis: Secondary | ICD-10-CM

## 2021-11-18 ENCOUNTER — Other Ambulatory Visit: Payer: Self-pay | Admitting: Family Medicine

## 2021-11-21 NOTE — Telephone Encounter (Signed)
Last refill for Lamisil- 06/29/21 Last VV-05/28/21  No future OV scheduled.   Can patient receive a refill for Lamisil?

## 2021-11-22 ENCOUNTER — Other Ambulatory Visit: Payer: Self-pay | Admitting: Family Medicine

## 2021-11-22 DIAGNOSIS — K219 Gastro-esophageal reflux disease without esophagitis: Secondary | ICD-10-CM

## 2021-11-26 ENCOUNTER — Other Ambulatory Visit: Payer: Self-pay | Admitting: Family Medicine

## 2021-12-04 ENCOUNTER — Encounter: Payer: Self-pay | Admitting: Family Medicine

## 2021-12-05 ENCOUNTER — Ambulatory Visit (INDEPENDENT_AMBULATORY_CARE_PROVIDER_SITE_OTHER): Payer: Medicaid Other | Admitting: Family Medicine

## 2021-12-05 VITALS — BP 120/80 | HR 93 | Temp 98.3°F | Wt 185.0 lb

## 2021-12-05 DIAGNOSIS — K047 Periapical abscess without sinus: Secondary | ICD-10-CM | POA: Diagnosis not present

## 2021-12-05 MED ORDER — IBUPROFEN 800 MG PO TABS: 800.0000 mg | ORAL_TABLET | Freq: Four times a day (QID) | ORAL | 0 refills | Status: DC | PRN

## 2021-12-05 MED ORDER — KETOCONAZOLE 2 % EX CREA
TOPICAL_CREAM | CUTANEOUS | 5 refills | Status: DC
Start: 2021-12-05 — End: 2023-03-28

## 2021-12-05 MED ORDER — AMOXICILLIN 500 MG PO CAPS: 500.0000 mg | ORAL_CAPSULE | Freq: Three times a day (TID) | ORAL | 0 refills | Status: AC

## 2021-12-05 MED ORDER — TRAMADOL HCL 50 MG PO TABS
50.0000 mg | ORAL_TABLET | Freq: Four times a day (QID) | ORAL | 0 refills | Status: DC | PRN
Start: 2021-12-05 — End: 2022-01-14

## 2021-12-05 NOTE — Telephone Encounter (Signed)
She was seen OV today  

## 2021-12-05 NOTE — Progress Notes (Signed)
   Subjective:    Patient ID: Diana Nelson, female    DOB: [birth year], 41 y.o.   MRN: [id number]  HPI Here for a dental abscess and pain. This started in April when she bit down on something hard and felt a sudden pain. She went to the ED on 09-07-21, and they found that the crown on the right lower 2nd molar had broken off. They gave her Amoxicillin, Ibuprofen ,and Tramadol and told her to see an oral surgeon. Her general dentist is Dr. Karie Schwalbe. D. Redd. She felt better for a few weeks, then the pain returned. Now she has had severe pain in the lower right jaw area, and it hurts to chew. No fever. She is on a waiting list to see 2 different oral surgeons in town.    Review of Systems  Constitutional: Negative.   HENT:  Positive for dental problem. Negative for postnasal drip, sinus pain and sore throat.   Eyes: Negative.   Respiratory: Negative.         Objective:   Physical Exam Constitutional:      General: She is not in acute distress.    Appearance: Normal appearance.  HENT:     Right Ear: Tympanic membrane, ear canal and external ear normal.     Left Ear: Tympanic membrane, ear canal and external ear normal.     Nose: Nose normal.     Mouth/Throat:     Comments: The right lower jaw has a tooth that appears to have had a crown in place at one time. The jaw is tender, and the AC nodes below the jaw are swollen and tender  Eyes:     Conjunctiva/sclera: Conjunctivae normal.  Neurological:     Mental Status: She is alert.           Assessment & Plan:  Pain from a broken tooth with an abscess. Treat with 10 days of Amoxicillin. She can use Ibuprofen 800 mg and Tramadol for the pain. See an oral surgeon ASAP.  Gershon Crane, MD

## 2021-12-13 ENCOUNTER — Other Ambulatory Visit: Payer: Self-pay | Admitting: Family Medicine

## 2021-12-17 MED ORDER — METHYLPHENIDATE HCL 20 MG PO TABS
20.0000 mg | ORAL_TABLET | Freq: Three times a day (TID) | ORAL | 0 refills | Status: DC
Start: 2021-12-26 — End: 2021-12-17

## 2021-12-17 MED ORDER — METHYLPHENIDATE HCL 20 MG PO TABS
20.0000 mg | ORAL_TABLET | Freq: Three times a day (TID) | ORAL | 0 refills | Status: DC
Start: 2022-01-26 — End: 2021-12-17

## 2021-12-17 MED ORDER — METHYLPHENIDATE HCL 20 MG PO TABS
20.0000 mg | ORAL_TABLET | Freq: Three times a day (TID) | ORAL | 0 refills | Status: DC
Start: 2022-02-25 — End: 2022-03-11

## 2021-12-17 NOTE — Telephone Encounter (Signed)
Done

## 2021-12-19 ENCOUNTER — Other Ambulatory Visit: Payer: Self-pay | Admitting: Family Medicine

## 2021-12-19 DIAGNOSIS — K219 Gastro-esophageal reflux disease without esophagitis: Secondary | ICD-10-CM

## 2021-12-25 ENCOUNTER — Ambulatory Visit
Admission: RE | Admit: 2021-12-25 | Discharge: 2021-12-25 | Disposition: A | Discharge status: Home / Self Care | Payer: Medicaid Other | Source: Ambulatory Visit | Attending: Nurse Practitioner | Admitting: Nurse Practitioner

## 2021-12-25 ENCOUNTER — Other Ambulatory Visit: Payer: Self-pay | Admitting: Nurse Practitioner

## 2021-12-25 DIAGNOSIS — M5459 Other low back pain: Secondary | ICD-10-CM

## 2021-12-25 DIAGNOSIS — M545 Low back pain, unspecified: Secondary | ICD-10-CM | POA: Diagnosis not present

## 2021-12-25 DIAGNOSIS — M542 Cervicalgia: Secondary | ICD-10-CM

## 2021-12-25 DIAGNOSIS — M4317 Spondylolisthesis, lumbosacral region: Secondary | ICD-10-CM | POA: Diagnosis not present

## 2021-12-25 DIAGNOSIS — M791 Myalgia, unspecified site: Secondary | ICD-10-CM | POA: Diagnosis not present

## 2022-01-12 ENCOUNTER — Other Ambulatory Visit: Payer: Self-pay | Admitting: Family Medicine

## 2022-01-14 NOTE — Telephone Encounter (Signed)
Dr. Claris Che patient.  Last OV- 12/05/21.   Per last OV patient to take meds for broken tooth  Last refill Tramadol--12/05/21-120 tabs, 0 refills Last refill Ibuprofen-12/05/21-60 tabs, 0 refills  No future OV scheduled.   Can this patient receive a refill?

## 2022-01-15 ENCOUNTER — Other Ambulatory Visit: Payer: Self-pay | Admitting: Family Medicine

## 2022-01-15 DIAGNOSIS — K219 Gastro-esophageal reflux disease without esophagitis: Secondary | ICD-10-CM

## 2022-01-18 ENCOUNTER — Ambulatory Visit: Payer: Medicaid Other | Attending: Nurse Practitioner

## 2022-01-25 ENCOUNTER — Other Ambulatory Visit: Payer: Self-pay | Admitting: Family Medicine

## 2022-02-07 ENCOUNTER — Other Ambulatory Visit: Payer: Self-pay | Admitting: Family Medicine

## 2022-02-08 ENCOUNTER — Other Ambulatory Visit: Payer: Self-pay

## 2022-02-08 ENCOUNTER — Other Ambulatory Visit: Payer: Self-pay | Admitting: Family Medicine

## 2022-02-08 DIAGNOSIS — K219 Gastro-esophageal reflux disease without esophagitis: Secondary | ICD-10-CM

## 2022-02-21 ENCOUNTER — Ambulatory Visit: Payer: Medicaid Other

## 2022-03-03 ENCOUNTER — Telehealth: Payer: Medicaid Other | Admitting: Nurse Practitioner

## 2022-03-03 DIAGNOSIS — K047 Periapical abscess without sinus: Secondary | ICD-10-CM | POA: Diagnosis not present

## 2022-03-03 MED ORDER — IBUPROFEN 800 MG PO TABS: 800.0000 mg | ORAL_TABLET | Freq: Three times a day (TID) | ORAL | 0 refills | Status: AC | PRN

## 2022-03-03 MED ORDER — CEFDINIR 300 MG PO CAPS: 300.0000 mg | ORAL_CAPSULE | Freq: Two times a day (BID) | ORAL | 0 refills | Status: AC

## 2022-03-03 NOTE — Patient Instructions (Signed)
Diana Nelson, thank you for joining Gildardo Pounds, NP for today's virtual visit.  While this provider is not your primary care provider (PCP), if your PCP is located in our provider database this encounter information will be shared with them immediately following your visit.  [facility] account gives you access to today's visit and all your visits, tests, and labs performed at [facility] " click here if you don't have a [facility] account or go to [email or web address]  Consent: (Patient) Diana Stanford Ammon provided verbal consent for this virtual visit at the beginning of the encounter.  Current Medications:  Current Outpatient Medications:    cefdinir (OMNICEF) 300 MG capsule, Take 1 capsule (300 mg total) by mouth 2 (two) times daily for 7 days., Disp: 14 capsule, Rfl: 0   albuterol (VENTOLIN HFA) 108 (90 Base) MCG/ACT inhaler, INHALE 2 PUFFS BY MOUTH EVERY 4 HOURS AS NEEDED FOR WHEEZE OR FOR SHORTNESS OF BREATH, Disp: 18 each, Rfl: 1   ALPRAZolam (XANAX) 1 MG tablet, TAKE 1 TABLET BY MOUTH THREE TIMES A DAY AS NEEDED FOR ANXIETY, Disp: 90 tablet, Rfl: 5   Cholecalciferol (VITAMIN D) 50 MCG (2000 UT) CAPS, Take 2,000 Units by mouth daily., Disp: , Rfl:    clindamycin (CLEOCIN T) 1 % SWAB, APPLY TO AFFECTED AREA TWICE A DAY, Disp: 60 each, Rfl: 5   fluticasone (FLONASE) 50 MCG/ACT nasal spray, Place 2 sprays into both nostrils daily. (Patient taking differently: Place 2 sprays into both nostrils daily as needed for allergies.), Disp: 16 g, Rfl: 6   ibuprofen (ADVIL) 800 MG tablet, Take 1 tablet (800 mg total) by mouth every 8 (eight) hours as needed., Disp: 60 tablet, Rfl: 0   ketoconazole (NIZORAL) 2 % cream, APPLY TO AFFECTED AREA TWICE A DAY, Disp: 15 g, Rfl: 5   lisinopril-hydrochlorothiazide (ZESTORETIC) 10-12.5 MG tablet, TAKE 1 TABLET BY MOUTH EVERY DAY, Disp: 90 tablet, Rfl: 0   loratadine (CLARITIN) 10 MG tablet, Take 10 mg by mouth daily., Disp: ,  Rfl:    methylphenidate (RITALIN) 20 MG tablet, Take 1 tablet (20 mg total) by mouth 3 (three) times daily with meals., Disp: 90 tablet, Rfl: 0   norethindrone (ORTHO MICRONOR) 0.35 MG tablet, Take 1 tablet (0.35 mg total) by mouth daily., Disp: 1 Package, Rfl: 11   omeprazole (PRILOSEC) 40 MG capsule, TAKE 1 CAPSULE BY MOUTH TWICE A DAY, Disp: 60 capsule, Rfl: 1   ondansetron (ZOFRAN) 4 MG tablet, Take 1 tablet (4 mg total) by mouth every 8 (eight) hours as needed for nausea or vomiting., Disp: 20 tablet, Rfl: 0   promethazine-dextromethorphan (PROMETHAZINE-DM) 6.25-15 MG/5ML syrup, Take 5 mLs by mouth 4 (four) times daily as needed for cough., Disp: 118 mL, Rfl: 0   rizatriptan (MAXALT) 10 MG tablet, TAKE 1 TABLET BY MOUTH AS NEEDED FOR MIGRAINE. MAY REPEAT IN 2 HOURS IF NEEDED, Disp: 10 tablet, Rfl: 2   Spacer/Aero-Holding Chambers (OPTICHAMBER DIAMOND) MISC, Use with inhaler, Disp: 1 each, Rfl: 1   terbinafine (LAMISIL) 250 MG tablet, TAKE 1 TABLET BY MOUTH EVERY DAY, Disp: 30 tablet, Rfl: 2   traMADol (ULTRAM) 50 MG tablet, TAKE 1 TABLET BY MOUTH EVERY 6 HOURS AS NEEDED FOR SEVERE PAIN., Disp: 20 tablet, Rfl: 0   Medications ordered in this encounter:  Meds ordered this encounter  Medications   cefdinir (OMNICEF) 300 MG capsule    Sig: Take 1 capsule (300 mg total) by mouth 2 (two) times daily  for 7 days.    Dispense:  14 capsule    Refill:  0    Order Specific Question:   Supervising Provider    Answer:   MILLER, BRIAN [[id number]]   ibuprofen (ADVIL) 800 MG tablet    Sig: Take 1 tablet (800 mg total) by mouth every 8 (eight) hours as needed.    Dispense:  60 tablet    Refill:  0    Order Specific Question:   Supervising Provider    Answer:   Hyacinth Meeker, BRIAN [[id number]]     *If you need refills on other medications prior to your next appointment, please contact your pharmacy*  Follow-Up: Call back or seek an in-person evaluation if the symptoms worsen or if the condition fails to improve as  anticipated.  [facility] [phone]  Other Instructions Follow up with oral surgeon as soon as possible   If you have been instructed to have an in-person evaluation today at a local Urgent Care facility, please use the link below. It will take you to a list of all of our available [facility], including address, phone number and hours of operation. Please do not delay care.  [facility]  If you or a family member do not have a primary care provider, use the link below to schedule a visit and establish care. When you choose a [facility] primary care physician or advanced practice provider, you gain a long-term partner in health. Find a Primary Care Provider  Learn more about [facility]'s in-office and virtual care options: [facility] - Get Care Now

## 2022-03-03 NOTE — Progress Notes (Signed)
Virtual Visit Consent   Diana Nelson, you are scheduled for a virtual visit with a [facility] provider today. Just as with appointments in the office, your consent must be obtained to participate. Your consent will be active for this visit and any virtual visit you may have with one of our providers in the next 365 days. If you have a MyChart account, a copy of this consent can be sent to you electronically.  As this is a virtual visit, video technology does not allow for your provider to perform a traditional examination. This may limit your provider's ability to fully assess your condition. If your provider identifies any concerns that need to be evaluated in person or the need to arrange testing (such as labs, EKG, etc.), we will make arrangements to do so. Although advances in technology are sophisticated, we cannot ensure that it will always work on either your end or our end. If the connection with a video visit is poor, the visit may have to be switched to a telephone visit. With either a video or telephone visit, we are not always able to ensure that we have a secure connection.  By engaging in this virtual visit, you consent to the provision of healthcare and authorize for your insurance to be billed (if applicable) for the services provided during this visit. Depending on your insurance coverage, you may receive a charge related to this service.  I need to obtain your verbal consent now. Are you willing to proceed with your visit today? Diana Nelson has provided verbal consent on 03/03/2022 for a virtual visit (video or telephone). Gildardo Pounds, NP  Date: 03/03/2022 9:58 AM  Virtual Visit via Video Note   I, Gildardo Pounds, connected with  Diana Nelson  ([id number], 1980-07-19) on 03/03/22 at  9:30 AM EDT by a video-enabled telemedicine application and verified that I am speaking with the correct person using two identifiers.  Location: Patient: Virtual Visit Location Patient:  Home Provider: Virtual Visit Location Provider: Home Office   I discussed the limitations of evaluation and management by telemedicine and the availability of in person appointments. The patient expressed understanding and agreed to proceed.    History of Present Illness: Diana Nelson is a 41 y.o. who identifies as a female who was assigned female at birth, and is being seen today for Tooth pain and possible abscess.  She was treated to dental abscess by her PCP several months ago with NSAIDs and abx.  Apparently she has not been able to follow up with the oral surgeon for lower right 2nd molar which has broken off. She describes intense pain today and is tearful during video visit. Associated symptoms include gum swelling.   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   Eczema 09/07/2016   Depression with anxiety 12/29/2015   Attention deficit hyperactivity disorder (ADHD) 12/29/2015   Low back pain 02/06/2015   HTN (hypertension) 02/06/2015   GERD 01/12/2009  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   Asthma 07/31/2007   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   Migraine headache 09/24/2006    Allergies:  Allergies  Allergen Reactions   Bactrim [Sulfamethoxazole-Trimethoprim] Rash   Azithromycin Nausea Only   Lamictal [Lamotrigine] Rash   Medications:  Current Outpatient Medications:    cefdinir (OMNICEF) 300 MG capsule,  Take 1 capsule (300 mg total) by mouth 2 (two) times daily for 7 days., Disp: 14 capsule, Rfl: 0   albuterol (VENTOLIN HFA) 108 (90 Base) MCG/ACT inhaler, INHALE 2 PUFFS BY MOUTH EVERY 4 HOURS AS NEEDED FOR WHEEZE OR FOR SHORTNESS OF BREATH, Disp: 18 each, Rfl: 1   ALPRAZolam (XANAX) 1 MG tablet, TAKE 1 TABLET BY MOUTH THREE TIMES A DAY AS NEEDED FOR ANXIETY, Disp: 90 tablet, Rfl: 5   Cholecalciferol (VITAMIN D) 50 MCG (2000 UT) CAPS, Take 2,000 Units by mouth daily., Disp: , Rfl:    clindamycin (CLEOCIN T) 1 % SWAB, APPLY TO AFFECTED AREA TWICE A DAY, Disp: 60 each, Rfl: 5   fluticasone (FLONASE) 50 MCG/ACT nasal spray, Place 2 sprays into both nostrils daily. (Patient taking differently: Place 2 sprays into both nostrils daily as needed for allergies.), Disp: 16 g, Rfl: 6   ibuprofen (ADVIL) 800 MG tablet, Take 1 tablet (800 mg total) by mouth every 8 (eight) hours as needed., Disp: 60 tablet, Rfl: 0   ketoconazole (NIZORAL) 2 % cream, APPLY TO AFFECTED AREA TWICE A DAY, Disp: 15 g, Rfl: 5   lisinopril-hydrochlorothiazide (ZESTORETIC) 10-12.5 MG tablet, TAKE 1 TABLET BY MOUTH EVERY DAY, Disp: 90 tablet, Rfl: 0   loratadine (CLARITIN) 10 MG tablet, Take 10 mg by mouth daily., Disp: , Rfl:    methylphenidate (RITALIN) 20 MG tablet, Take 1 tablet (20 mg total) by mouth 3 (three) times daily with meals., Disp: 90 tablet, Rfl: 0   norethindrone (ORTHO MICRONOR) 0.35 MG tablet, Take 1 tablet (0.35 mg total) by mouth daily., Disp: 1 Package, Rfl: 11   omeprazole (PRILOSEC) 40 MG capsule, TAKE 1 CAPSULE BY MOUTH TWICE A DAY, Disp: 60 capsule, Rfl: 1   ondansetron (ZOFRAN) 4 MG tablet, Take 1 tablet (4 mg total) by mouth every 8 (eight) hours as needed for nausea or vomiting., Disp: 20 tablet, Rfl: 0   promethazine-dextromethorphan (PROMETHAZINE-DM) 6.25-15 MG/5ML syrup, Take 5 mLs by mouth 4 (four) times daily as needed for cough., Disp: 118 mL, Rfl: 0   rizatriptan (MAXALT) 10 MG tablet, TAKE 1 TABLET BY  MOUTH AS NEEDED FOR MIGRAINE. MAY REPEAT IN 2 HOURS IF NEEDED, Disp: 10 tablet, Rfl: 2   Spacer/Aero-Holding Chambers [facility] DIAMOND) MISC, Use with inhaler, Disp: 1 each, Rfl: 1   terbinafine (LAMISIL) 250 MG tablet, TAKE 1 TABLET BY MOUTH EVERY DAY, Disp: 30 tablet, Rfl: 2   traMADol (ULTRAM) 50 MG tablet, TAKE 1 TABLET BY MOUTH EVERY 6 HOURS AS NEEDED FOR SEVERE PAIN., Disp: 20 tablet, Rfl: 0  Observations/Objective: Patient is well-developed, well-nourished, tearful and in pain Resting comfortably at home.  Head is normocephalic, atraumatic.  No labored breathing.  Speech is clear and coherent with logical content.  Patient is alert and oriented at baseline.    Assessment and Plan: 1. Tooth abscess - cefdinir (OMNICEF) 300 MG capsule; Take 1 capsule (300 mg total) by mouth 2 (two) times daily for 7 days.  Dispense: 14 capsule; Refill: 0 - ibuprofen (ADVIL) 800 MG tablet; Take 1 tablet (800 mg total) by mouth every 8 (eight) hours as needed.  Dispense: 60  tablet; Refill: 0  Follow up with oral surgeon as soon as possible  Follow Up Instructions: I discussed the assessment and treatment plan with the patient. The patient was provided an opportunity to ask questions and all were answered. The patient agreed with the plan and demonstrated an understanding of the instructions.  A copy of instructions were sent to the patient via MyChart unless otherwise noted below.    The patient was advised to call back or seek an in-person evaluation if the symptoms worsen or if the condition fails to improve as anticipated.  Time:  I spent 11 minutes with the patient via telehealth technology discussing the above problems/concerns.    Claiborne Rigg, NP

## 2022-03-04 ENCOUNTER — Encounter: Payer: Self-pay | Admitting: Family Medicine

## 2022-03-04 DIAGNOSIS — S025XXK Fracture of tooth (traumatic), subsequent encounter for fracture with nonunion: Secondary | ICD-10-CM

## 2022-03-04 NOTE — Telephone Encounter (Signed)
I did the referral to oral surgery. Please get her the work note as above

## 2022-03-05 ENCOUNTER — Other Ambulatory Visit: Payer: Self-pay | Admitting: Family Medicine

## 2022-03-05 DIAGNOSIS — K219 Gastro-esophageal reflux disease without esophagitis: Secondary | ICD-10-CM

## 2022-03-07 ENCOUNTER — Encounter: Payer: Self-pay | Admitting: Family Medicine

## 2022-03-07 ENCOUNTER — Encounter: Payer: Self-pay | Admitting: Nurse Practitioner

## 2022-03-08 NOTE — Telephone Encounter (Signed)
Please get her this work note

## 2022-03-11 ENCOUNTER — Other Ambulatory Visit: Payer: Self-pay | Admitting: Family Medicine

## 2022-03-11 ENCOUNTER — Encounter: Payer: Self-pay | Admitting: Family Medicine

## 2022-03-13 ENCOUNTER — Other Ambulatory Visit: Payer: Self-pay | Admitting: Family Medicine

## 2022-03-13 ENCOUNTER — Other Ambulatory Visit: Payer: Self-pay

## 2022-03-13 DIAGNOSIS — K047 Periapical abscess without sinus: Secondary | ICD-10-CM

## 2022-03-13 MED ORDER — AMOXICILLIN 500 MG PO TABS: 500.0000 mg | ORAL_TABLET | Freq: Three times a day (TID) | ORAL | 0 refills | Status: AC

## 2022-03-13 NOTE — Telephone Encounter (Signed)
Last OV-12/05/21 Last refill- 09/25/21-90 tabs, 5 refills  No future OV scheduled.

## 2022-03-13 NOTE — Telephone Encounter (Signed)
Last refill-90 tabs, 0 refills Last OV-12/05/21  No future OV scheduled.

## 2022-03-13 NOTE — Telephone Encounter (Signed)
Last refills both medications-11/21/21 Last OV-12/05/21  No future OV scheduled.

## 2022-03-13 NOTE — Telephone Encounter (Signed)
Call in Amoxicillin 500 mg TID for 10 days  

## 2022-03-13 NOTE — Telephone Encounter (Signed)
Last refill sent by Dr. Elease Hashimoto on 01/14/22-20 tabs, 0 refills Last OV-12/05/21  No future OV scheduled.

## 2022-03-14 ENCOUNTER — Telehealth: Payer: Self-pay

## 2022-03-14 MED ORDER — METHYLPHENIDATE HCL 20 MG PO TABS: 20.0000 mg | ORAL_TABLET | Freq: Three times a day (TID) | ORAL | 0 refills | Status: DC

## 2022-03-14 NOTE — Telephone Encounter (Signed)
Done

## 2022-03-14 NOTE — Telephone Encounter (Signed)
I just refilled it 

## 2022-03-14 NOTE — Telephone Encounter (Signed)
Please check on this referral for Dr Sarajane Jews, pt has not heard anything back.

## 2022-03-14 NOTE — Telephone Encounter (Signed)
This was provided to pt via MyChart

## 2022-03-14 NOTE — Telephone Encounter (Signed)
Work note provided and message sent to [facility]  referral coordinator to see what is the hold up

## 2022-03-14 NOTE — Telephone Encounter (Signed)
Pt notified via MyChart about Rx

## 2022-03-15 MED ORDER — ALPRAZOLAM 1 MG PO TABS: ORAL_TABLET | ORAL | 5 refills | Status: DC

## 2022-03-15 NOTE — Telephone Encounter (Signed)
[facility]

## 2022-03-27 ENCOUNTER — Encounter: Payer: Self-pay | Admitting: Family Medicine

## 2022-03-27 NOTE — Telephone Encounter (Signed)
Pt called to FU on this request.

## 2022-03-28 MED ORDER — METHYLPHENIDATE HCL 20 MG PO TABS: 20.0000 mg | ORAL_TABLET | Freq: Three times a day (TID) | ORAL | 0 refills | Status: DC

## 2022-03-28 NOTE — Telephone Encounter (Signed)
Done

## 2022-04-10 ENCOUNTER — Other Ambulatory Visit: Payer: Self-pay | Admitting: Family Medicine

## 2022-04-29 ENCOUNTER — Other Ambulatory Visit: Payer: Self-pay | Admitting: Family Medicine

## 2022-05-14 ENCOUNTER — Other Ambulatory Visit: Payer: Self-pay | Admitting: Family Medicine

## 2022-05-14 DIAGNOSIS — K219 Gastro-esophageal reflux disease without esophagitis: Secondary | ICD-10-CM

## 2022-05-29 ENCOUNTER — Encounter: Payer: Self-pay | Admitting: Family Medicine

## 2022-05-29 ENCOUNTER — Other Ambulatory Visit: Payer: Self-pay | Admitting: Family Medicine

## 2022-05-29 NOTE — Telephone Encounter (Signed)
I could do this, but we will need to do yearly exams with Pap smears [place] until she is 71 or so

## 2022-05-29 NOTE — Telephone Encounter (Addendum)
Last OV-12/05/21 Last refill-03/14/22-120 tabs, 0 refills  No future OV scheduled.

## 2022-06-03 ENCOUNTER — Encounter: Payer: Self-pay | Admitting: Family Medicine

## 2022-06-03 MED ORDER — AMPHETAMINE-DEXTROAMPHETAMINE 20 MG PO TABS: 20.0000 mg | ORAL_TABLET | Freq: Three times a day (TID) | ORAL | 0 refills | Status: DC

## 2022-06-03 NOTE — Telephone Encounter (Signed)
I sent in one month of Adderall

## 2022-06-20 ENCOUNTER — Encounter: Payer: Self-pay | Admitting: Adult Health

## 2022-06-20 NOTE — Telephone Encounter (Signed)
Called pt back and she wanted to see her Diana Nelson can take her husband back as pt.

## 2022-06-21 ENCOUNTER — Ambulatory Visit: Payer: Medicaid Other | Admitting: Adult Health

## 2022-06-21 NOTE — Telephone Encounter (Signed)
FYI other note sent to you as well

## 2022-06-26 DIAGNOSIS — H5213 Myopia, bilateral: Secondary | ICD-10-CM | POA: Diagnosis not present

## 2022-06-27 ENCOUNTER — Other Ambulatory Visit: Payer: Self-pay | Admitting: Family Medicine

## 2022-07-05 ENCOUNTER — Other Ambulatory Visit: Payer: Self-pay | Admitting: Family Medicine

## 2022-07-11 MED ORDER — METHYLPHENIDATE HCL 20 MG PO TABS: 20.0000 mg | ORAL_TABLET | Freq: Three times a day (TID) | ORAL | 0 refills | Status: DC

## 2022-07-11 NOTE — Telephone Encounter (Signed)
Dr. Barbie Banner patient.  Last OV-12/05/21 Last refill-03/28/22--90 tabs, 0 refill  No future OV scheduled.

## 2022-07-14 ENCOUNTER — Other Ambulatory Visit: Payer: Self-pay | Admitting: Family Medicine

## 2022-07-19 DIAGNOSIS — Z419 Encounter for procedure for purposes other than remedying health state, unspecified: Secondary | ICD-10-CM | POA: Diagnosis not present

## 2022-07-20 ENCOUNTER — Other Ambulatory Visit: Payer: Self-pay | Admitting: Family Medicine

## 2022-07-20 DIAGNOSIS — K219 Gastro-esophageal reflux disease without esophagitis: Secondary | ICD-10-CM

## 2022-07-22 ENCOUNTER — Other Ambulatory Visit: Payer: Self-pay

## 2022-07-22 DIAGNOSIS — K219 Gastro-esophageal reflux disease without esophagitis: Secondary | ICD-10-CM

## 2022-07-22 MED ORDER — OMEPRAZOLE 40 MG PO CPDR: 40.0000 mg | DELAYED_RELEASE_CAPSULE | Freq: Two times a day (BID) | ORAL | 0 refills | Status: DC

## 2022-07-22 NOTE — Telephone Encounter (Signed)
Last OV-12/05/21 Last refill-07/31/21  No future OV scheduled.

## 2022-08-13 ENCOUNTER — Other Ambulatory Visit: Payer: Self-pay | Admitting: Family Medicine

## 2022-08-14 ENCOUNTER — Other Ambulatory Visit: Payer: Self-pay | Admitting: Adult Health

## 2022-08-15 MED ORDER — METHYLPHENIDATE HCL 20 MG PO TABS: 20.0000 mg | ORAL_TABLET | Freq: Three times a day (TID) | ORAL | 0 refills | Status: DC

## 2022-08-15 NOTE — Telephone Encounter (Signed)
Done

## 2022-08-19 ENCOUNTER — Other Ambulatory Visit: Payer: Self-pay | Admitting: Family Medicine

## 2022-08-19 DIAGNOSIS — Z419 Encounter for procedure for purposes other than remedying health state, unspecified: Secondary | ICD-10-CM | POA: Diagnosis not present

## 2022-08-27 ENCOUNTER — Encounter: Payer: Self-pay | Admitting: Family Medicine

## 2022-08-28 ENCOUNTER — Encounter: Payer: Self-pay | Admitting: Family Medicine

## 2022-08-28 DIAGNOSIS — J3089 Other allergic rhinitis: Secondary | ICD-10-CM

## 2022-08-28 MED ORDER — ALPRAZOLAM 1 MG PO TABS: 1.0000 mg | ORAL_TABLET | Freq: Three times a day (TID) | ORAL | 5 refills | Status: DC | PRN

## 2022-08-28 NOTE — Telephone Encounter (Signed)
Done

## 2022-09-02 ENCOUNTER — Other Ambulatory Visit: Payer: Self-pay

## 2022-09-02 DIAGNOSIS — J3089 Other allergic rhinitis: Secondary | ICD-10-CM | POA: Insufficient documentation

## 2022-09-02 NOTE — Telephone Encounter (Signed)
Please send them a copy of her problem list which has allergies and asthma on it

## 2022-09-03 ENCOUNTER — Other Ambulatory Visit: Payer: Self-pay

## 2022-09-03 NOTE — Telephone Encounter (Signed)
This was faxed to the fax number provided

## 2022-09-10 DIAGNOSIS — Z0000 Encounter for general adult medical examination without abnormal findings: Secondary | ICD-10-CM | POA: Diagnosis not present

## 2022-09-10 DIAGNOSIS — Z6832 Body mass index (BMI) 32.0-32.9, adult: Secondary | ICD-10-CM | POA: Diagnosis not present

## 2022-09-10 DIAGNOSIS — Z1231 Encounter for screening mammogram for malignant neoplasm of breast: Secondary | ICD-10-CM | POA: Diagnosis not present

## 2022-09-10 DIAGNOSIS — R87610 Atypical squamous cells of undetermined significance on cytologic smear of cervix (ASC-US): Secondary | ICD-10-CM | POA: Diagnosis not present

## 2022-09-12 ENCOUNTER — Other Ambulatory Visit: Payer: Self-pay | Admitting: Family Medicine

## 2022-09-12 DIAGNOSIS — K219 Gastro-esophageal reflux disease without esophagitis: Secondary | ICD-10-CM

## 2022-09-13 ENCOUNTER — Other Ambulatory Visit: Payer: Self-pay | Admitting: Family Medicine

## 2022-09-13 DIAGNOSIS — K219 Gastro-esophageal reflux disease without esophagitis: Secondary | ICD-10-CM

## 2022-09-16 MED ORDER — ALBUTEROL SULFATE HFA 108 (90 BASE) MCG/ACT IN AERS: INHALATION_SPRAY | RESPIRATORY_TRACT | 0 refills | Status: DC

## 2022-09-16 MED ORDER — OMEPRAZOLE 40 MG PO CPDR: 40.0000 mg | DELAYED_RELEASE_CAPSULE | Freq: Two times a day (BID) | ORAL | 0 refills | Status: DC

## 2022-09-16 MED ORDER — LISINOPRIL-HYDROCHLOROTHIAZIDE 10-12.5 MG PO TABS: 1.0000 | ORAL_TABLET | Freq: Every day | ORAL | 0 refills | Status: DC

## 2022-09-18 DIAGNOSIS — Z419 Encounter for procedure for purposes other than remedying health state, unspecified: Secondary | ICD-10-CM | POA: Diagnosis not present

## 2022-10-03 ENCOUNTER — Other Ambulatory Visit: Payer: Self-pay

## 2022-10-03 MED ORDER — VENTOLIN HFA 108 (90 BASE) MCG/ACT IN AERS: 2.0000 | INHALATION_SPRAY | RESPIRATORY_TRACT | 5 refills | Status: DC | PRN

## 2022-10-15 ENCOUNTER — Other Ambulatory Visit (HOSPITAL_COMMUNITY): Payer: Self-pay

## 2022-10-19 DIAGNOSIS — Z419 Encounter for procedure for purposes other than remedying health state, unspecified: Secondary | ICD-10-CM | POA: Diagnosis not present

## 2022-11-11 ENCOUNTER — Other Ambulatory Visit: Payer: Self-pay | Admitting: Family Medicine

## 2022-11-12 MED ORDER — METHYLPHENIDATE HCL 20 MG PO TABS: 20.0000 mg | ORAL_TABLET | Freq: Three times a day (TID) | ORAL | 0 refills | Status: DC

## 2022-11-12 MED ORDER — METHYLPHENIDATE HCL 20 MG PO TABS
20.0000 mg | ORAL_TABLET | Freq: Three times a day (TID) | ORAL | 0 refills | Status: DC
Start: 2022-11-14 — End: 2023-01-17

## 2022-11-12 NOTE — Telephone Encounter (Signed)
Done

## 2022-11-18 DIAGNOSIS — Z419 Encounter for procedure for purposes other than remedying health state, unspecified: Secondary | ICD-10-CM | POA: Diagnosis not present

## 2022-12-14 ENCOUNTER — Other Ambulatory Visit: Payer: Self-pay | Admitting: Family Medicine

## 2022-12-19 DIAGNOSIS — Z419 Encounter for procedure for purposes other than remedying health state, unspecified: Secondary | ICD-10-CM | POA: Diagnosis not present

## 2022-12-21 ENCOUNTER — Other Ambulatory Visit: Payer: Self-pay | Admitting: Family Medicine

## 2022-12-21 DIAGNOSIS — K219 Gastro-esophageal reflux disease without esophagitis: Secondary | ICD-10-CM

## 2022-12-25 ENCOUNTER — Telehealth (INDEPENDENT_AMBULATORY_CARE_PROVIDER_SITE_OTHER): Payer: Self-pay | Admitting: Family Medicine

## 2022-12-25 DIAGNOSIS — J4540 Moderate persistent asthma, uncomplicated: Secondary | ICD-10-CM

## 2022-12-26 NOTE — Progress Notes (Signed)
This was a NO SHOW

## 2023-01-11 ENCOUNTER — Other Ambulatory Visit: Payer: Self-pay | Admitting: Family Medicine

## 2023-01-14 ENCOUNTER — Telehealth: Payer: Medicaid Other | Admitting: Family Medicine

## 2023-01-15 ENCOUNTER — Other Ambulatory Visit: Payer: Self-pay | Admitting: Family Medicine

## 2023-01-17 ENCOUNTER — Encounter: Payer: Self-pay | Admitting: Family Medicine

## 2023-01-17 ENCOUNTER — Telehealth: Payer: Medicaid Other | Admitting: Family Medicine

## 2023-01-17 DIAGNOSIS — K219 Gastro-esophageal reflux disease without esophagitis: Secondary | ICD-10-CM | POA: Diagnosis not present

## 2023-01-17 DIAGNOSIS — F900 Attention-deficit hyperactivity disorder, predominantly inattentive type: Secondary | ICD-10-CM

## 2023-01-17 MED ORDER — METHYLPHENIDATE HCL 20 MG PO TABS: 20.0000 mg | ORAL_TABLET | Freq: Three times a day (TID) | ORAL | 0 refills | Status: DC

## 2023-01-17 MED ORDER — ONDANSETRON HCL 4 MG PO TABS: 4.0000 mg | ORAL_TABLET | Freq: Three times a day (TID) | ORAL | 3 refills | Status: DC | PRN

## 2023-01-17 MED ORDER — OMEPRAZOLE 40 MG PO CPDR
40.0000 mg | DELAYED_RELEASE_CAPSULE | Freq: Two times a day (BID) | ORAL | 3 refills | Status: DC
Start: 2023-01-17 — End: 2023-12-01

## 2023-01-17 NOTE — Progress Notes (Signed)
Subjective:    Patient ID: Diana Nelson, female    DOB: [birth year], 42 y.o.   MRN: [id number]  HPI Virtual Visit via Video Note  I connected with the patient on 01/17/23 at 11:00 AM EDT by a video enabled telemedicine application and verified that I am speaking with the correct person using two identifiers.  Location patient: home Location provider:work or home office Persons participating in the virtual visit: patient, provider  I discussed the limitations of evaluation and management by telemedicine and the availability of in person appointments. The patient expressed understanding and agreed to proceed.   HPI: Here for medication refills. Her ADHD is well controlled with the Methylphenidate. No side effects to report.    ROS: See pertinent positives and negatives per HPI.  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   Eczema    GERD (gastroesophageal reflux disease)    Hypertension    Migraines    PONV (postoperative nausea and vomiting)     Past Surgical History:  Procedure Laterality Date   knee surgeries     x3   SPINAL FUSION     2006    TONSILLECTOMY      Family History  Problem Relation Age of Onset   Birth defects Daughter        pectus excavatum surgery at age 41   Diabetes Mother      Current Outpatient Medications:    albuterol (VENTOLIN HFA) 108 (90 Base) MCG/ACT inhaler, INHALE 2 PUFFS BY MOUTH EVERY 4 HOURS AS NEEDED FOR WHEEZE OR FOR SHORTNESS OF BREATH, Disp: 18 each, Rfl: 1   ALPRAZolam (XANAX) 1 MG tablet, Take 1 tablet (1 mg total) by mouth 3 (three) times daily as needed for anxiety. TAKE 1 TABLET BY MOUTH THREE TIMES A DAY AS NEEDED FOR ANXIETY, Disp: 90 tablet, Rfl: 5   Cholecalciferol (VITAMIN D) 50 MCG (2000 UT) CAPS, Take 2,000 Units by mouth daily., Disp: , Rfl:    clindamycin (CLEOCIN T) 1 % SWAB, APPLY TO AFFECTED AREA  TWICE A DAY, Disp: 60 each, Rfl: 5   fluticasone (FLONASE) 50 MCG/ACT nasal spray, Place 2 sprays into both nostrils daily. (Patient taking differently: Place 2 sprays into both nostrils daily as needed for allergies.), Disp: 16 g, Rfl: 6   ibuprofen (ADVIL) 800 MG tablet, Take 1 tablet (800 mg total) by mouth every 8 (eight) hours as needed., Disp: 60 tablet, Rfl: 0   ketoconazole (NIZORAL) 2 % cream, APPLY TO AFFECTED AREA TWICE A DAY, Disp: 15 g, Rfl: 5   lisinopril-hydrochlorothiazide (ZESTORETIC) 10-12.5 MG tablet, TAKE 1 TABLET BY MOUTH EVERY DAY, Disp: 90 tablet, Rfl: 1   loratadine (CLARITIN) 10 MG tablet, Take 10 mg by mouth daily., Disp: , Rfl:    methylphenidate (RITALIN) 20 MG tablet, Take 1 tablet (20 mg total) by mouth 3 (three) times daily with meals., Disp: 90 tablet, Rfl: 0   methylphenidate (RITALIN) 20 MG tablet, Take 1 tablet (20 mg total) by mouth 3 (three) times daily with meals., Disp: 90 tablet, Rfl: 0   methylphenidate (RITALIN) 20 MG tablet, Take 1 tablet (20 mg total) by mouth 3 (three) times daily with meals., Disp: 90 tablet, Rfl: 0   norethindrone (ORTHO MICRONOR) 0.35 MG tablet, Take 1 tablet (0.35  mg total) by mouth daily., Disp: 1 Package, Rfl: 11   omeprazole (PRILOSEC) 40 MG capsule, Take 1 capsule (40 mg total) by mouth 2 (two) times daily., Disp: 180 capsule, Rfl: 0   ondansetron (ZOFRAN) 4 MG tablet, Take 1 tablet (4 mg total) by mouth every 8 (eight) hours as needed for nausea or vomiting., Disp: 20 tablet, Rfl: 0   promethazine-dextromethorphan (PROMETHAZINE-DM) 6.25-15 MG/5ML syrup, Take 5 mLs by mouth 4 (four) times daily as needed for cough., Disp: 118 mL, Rfl: 0   rizatriptan (MAXALT) 10 MG tablet, TAKE 1 TABLET BY MOUTH AS NEEDED FOR MIGRAINE. MAY REPEAT IN 2 HOURS IF NEEDED, Disp: 10 tablet, Rfl: 11   Spacer/Aero-Holding Chambers (OPTICHAMBER DIAMOND) MISC, Use with inhaler, Disp: 1 each, Rfl: 1   terbinafine (LAMISIL) 250 MG tablet, TAKE 1 TABLET BY MOUTH  EVERY DAY, Disp: 30 tablet, Rfl: 2   traMADol (ULTRAM) 50 MG tablet, TAKE 1 TABLET BY MOUTH EVERY 6 HOURS AS NEEDED FOR SEVERE PAIN, Disp: 20 tablet, Rfl: 5  EXAM:  VITALS per patient if applicable:  GENERAL: alert, oriented, appears well and in no acute distress  HEENT: atraumatic, conjunttiva clear, no obvious abnormalities on inspection of external nose and ears  NECK: normal movements of the head and neck  LUNGS: on inspection no signs of respiratory distress, breathing rate appears normal, no obvious gross SOB, gasping or wheezing  CV: no obvious cyanosis  MS: moves all visible extremities without noticeable abnormality  PSYCH/NEURO: pleasant and cooperative, no obvious depression or anxiety, speech and thought processing grossly intact  ASSESSMENT AND PLAN: ADHD is stable. Refills were sent in. I reminded her we have not seen her in the office for over a year, so she will set up a well exam with labs sometime soon.  Gershon Crane, MD   Discussed the following assessment and plan:  No diagnosis found.     I discussed the assessment and treatment plan with the patient. The patient was provided an opportunity to ask questions and all were answered. The patient agreed with the plan and demonstrated an understanding of the instructions.   The patient was advised to call back or seek an in-person evaluation if the symptoms worsen or if the condition fails to improve as anticipated.      Review of Systems     Objective:   Physical Exam        Assessment & Plan:

## 2023-01-19 DIAGNOSIS — Z419 Encounter for procedure for purposes other than remedying health state, unspecified: Secondary | ICD-10-CM | POA: Diagnosis not present

## 2023-02-18 DIAGNOSIS — Z419 Encounter for procedure for purposes other than remedying health state, unspecified: Secondary | ICD-10-CM | POA: Diagnosis not present

## 2023-02-26 ENCOUNTER — Other Ambulatory Visit: Payer: Self-pay | Admitting: Family Medicine

## 2023-02-27 NOTE — Telephone Encounter (Signed)
Pt LOV was on 01/17/23 Pt last refill was done on 08/28/22 Please advise

## 2023-03-21 DIAGNOSIS — Z419 Encounter for procedure for purposes other than remedying health state, unspecified: Secondary | ICD-10-CM | POA: Diagnosis not present

## 2023-03-28 ENCOUNTER — Other Ambulatory Visit: Payer: Self-pay | Admitting: Family Medicine

## 2023-04-08 ENCOUNTER — Ambulatory Visit (INDEPENDENT_AMBULATORY_CARE_PROVIDER_SITE_OTHER): Payer: Medicaid Other

## 2023-04-08 DIAGNOSIS — Z23 Encounter for immunization: Secondary | ICD-10-CM | POA: Diagnosis not present

## 2023-04-20 DIAGNOSIS — Z419 Encounter for procedure for purposes other than remedying health state, unspecified: Secondary | ICD-10-CM | POA: Diagnosis not present

## 2023-04-22 DIAGNOSIS — F4311 Post-traumatic stress disorder, acute: Secondary | ICD-10-CM | POA: Diagnosis not present

## 2023-04-22 DIAGNOSIS — F411 Generalized anxiety disorder: Secondary | ICD-10-CM | POA: Diagnosis not present

## 2023-05-10 ENCOUNTER — Other Ambulatory Visit: Payer: Self-pay | Admitting: Family Medicine

## 2023-05-15 ENCOUNTER — Telehealth: Payer: Self-pay

## 2023-05-15 MED ORDER — METHYLPHENIDATE HCL 20 MG PO TABS: 20.0000 mg | ORAL_TABLET | Freq: Three times a day (TID) | ORAL | 0 refills | Status: DC

## 2023-05-15 NOTE — Telephone Encounter (Signed)
Done

## 2023-05-15 NOTE — Telephone Encounter (Signed)
Pt VV was on 01/17/23 Last refill was done on 02/14/23 Please advise

## 2023-05-15 NOTE — Telephone Encounter (Signed)
Copied from CRM [id number]. Topic: Clinical - Medication Refill >> May 15, 2023  9:36 AM Orinda Kenner C wrote: Most Recent Primary Care Visit:  Provider: Carola Rhine  Department: [facility]  Visit Type: FLU SHOT  Date: 04/08/2023  Medication: methylphenidate (RITALIN) 20 MG tablet  Has the patient contacted their pharmacy? No (Agent: If no, request that the patient contact the pharmacy for the refill. If patient does not wish to contact the pharmacy document the reason why and proceed with request.) (Agent: If yes, when and what did the pharmacy advise?)  Is this the correct pharmacy for this prescription? Yes If no, delete pharmacy and type the correct one.  This is the patient's preferred pharmacy:  [id number] - [place], [place] - [address] AT [address] [facility] [address] [place] [address] Phone: [phone] Fax: [phone]   Has the prescription been filled recently?   Is the patient out of the medication? Yes  Has the patient been seen for an appointment in the last year OR does the patient have an upcoming appointment?   Can we respond through MyChart? No, pls c/b [phone]  Agent: Please be advised that Rx refills may take up to 3 business days. We ask that you follow-up with your pharmacy.

## 2023-05-19 ENCOUNTER — Encounter: Payer: Self-pay | Admitting: Family Medicine

## 2023-05-19 ENCOUNTER — Ambulatory Visit (INDEPENDENT_AMBULATORY_CARE_PROVIDER_SITE_OTHER): Payer: Medicaid Other | Admitting: Family Medicine

## 2023-05-19 VITALS — BP 98/62 | HR 91 | Temp 98.0°F | Wt 184.0 lb

## 2023-05-19 DIAGNOSIS — G478 Other sleep disorders: Secondary | ICD-10-CM | POA: Insufficient documentation

## 2023-05-19 DIAGNOSIS — F418 Other specified anxiety disorders: Secondary | ICD-10-CM

## 2023-05-19 MED ORDER — BUSPIRONE HCL 10 MG PO TABS: 10.0000 mg | ORAL_TABLET | Freq: Two times a day (BID) | ORAL | 2 refills | Status: DC

## 2023-05-19 NOTE — Progress Notes (Signed)
    Subjective:    Patient ID: Diana Nelson, female    DOB: 06-05-1980, 42 y.o.   MRN: [id number]  HPI Here asking for help with her anxiety. She says she began having episodes of "sleep paralysis" when she was 42 years old. These involve her waking up in the morning but being unable to move her body at all for a few seconds to minutes. These used to occur twice a year, but lately she is having 2 a week. She is convinced this is in response to her anxiety. She is dealing with a lot of issues including caring for a child with a rare disease and dealing with her estranged husband (they have been separated for 8 years) who is still fighting her over issues in court. She has slowly increased her dosing of Xanax from 3 pills a day to 5 or 6 a day. She started meeting with a therapist a few weeks ago, and she finds this to be helpful.    Review of Systems  Constitutional: Negative.   Respiratory: Negative.    Cardiovascular: Negative.   Psychiatric/Behavioral:  Positive for dysphoric mood and sleep disturbance. The patient is nervous/anxious.        Objective:   Physical Exam Constitutional:      Appearance: Normal appearance.  Cardiovascular:     Rate and Rhythm: Normal rate and regular rhythm.     Pulses: Normal pulses.     Heart sounds: Normal heart sounds.  Pulmonary:     Effort: Pulmonary effort is normal.     Breath sounds: Normal breath sounds.  Neurological:     General: No focal deficit present.     Mental Status: She is alert and oriented to person, place, and time.  Psychiatric:        Behavior: Behavior normal.        Thought Content: Thought content normal.     Comments: Tearful and anxious            Assessment & Plan:  Her anxiety has become more of a problem, and these sleep paralysis spells are manifestations of this. We will start her on Buspar 10 mg BID, and I advise her to take no more than 3 Xanax pills a day. She will return in 2 weeks . Gershon Crane,  MD

## 2023-05-21 DIAGNOSIS — Z419 Encounter for procedure for purposes other than remedying health state, unspecified: Secondary | ICD-10-CM | POA: Diagnosis not present

## 2023-05-22 DIAGNOSIS — F411 Generalized anxiety disorder: Secondary | ICD-10-CM | POA: Diagnosis not present

## 2023-05-22 DIAGNOSIS — F4311 Post-traumatic stress disorder, acute: Secondary | ICD-10-CM | POA: Diagnosis not present

## 2023-05-30 DIAGNOSIS — F411 Generalized anxiety disorder: Secondary | ICD-10-CM | POA: Diagnosis not present

## 2023-05-30 DIAGNOSIS — F4311 Post-traumatic stress disorder, acute: Secondary | ICD-10-CM | POA: Diagnosis not present

## 2023-06-02 ENCOUNTER — Ambulatory Visit: Payer: Medicaid Other | Admitting: Family Medicine

## 2023-06-04 ENCOUNTER — Telehealth (INDEPENDENT_AMBULATORY_CARE_PROVIDER_SITE_OTHER): Payer: Medicaid Other | Admitting: Family Medicine

## 2023-06-04 DIAGNOSIS — F418 Other specified anxiety disorders: Secondary | ICD-10-CM | POA: Diagnosis not present

## 2023-06-04 DIAGNOSIS — K8020 Calculus of gallbladder without cholecystitis without obstruction: Secondary | ICD-10-CM | POA: Insufficient documentation

## 2023-06-04 MED ORDER — BUSPIRONE HCL 10 MG PO TABS: 20.0000 mg | ORAL_TABLET | Freq: Two times a day (BID) | ORAL | 2 refills | Status: DC

## 2023-06-04 NOTE — Progress Notes (Signed)
 Subjective:    Patient ID: Diana Nelson, female    DOB: [birth year], 43 y.o.   MRN: [id number]  HPI Virtual Visit via Video Note  I connected with the patient on 06/04/23 at  1:30 PM EST by a video enabled telemedicine application and verified that I am speaking with the correct person using two identifiers.  Location patient: home Location provider:work or home office Persons participating in the virtual visit: patient, provider  I discussed the limitations of evaluation and management by telemedicine and the availability of in person appointments. The patient expressed understanding and agreed to proceed.   HPI: Here to follow up on anxiety. Two weeks ago we spoke of her anxiety which had worsened a lot lately. She was having "sleep paralysis" at night and she was taking more Xanax  than was prescribed. She was started on Buspar  10 mg BID, and this has been very helpful. Her anxiety is under better control, and she is sleeping better. She no longer takes any extra Xanax . She would like to increase the dose however.    ROS: See pertinent positives and negatives per HPI.  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   Eczema    GERD (gastroesophageal reflux disease)    Hypertension    Migraines    PONV (postoperative nausea and vomiting)     Past Surgical History:  Procedure Laterality Date   knee surgeries     x3   SPINAL FUSION     2006    TONSILLECTOMY      Family History  Problem Relation Age of Onset   Birth defects Daughter        pectus excavatum surgery at age 91   Diabetes Mother      Current Outpatient Medications:    albuterol  (VENTOLIN  HFA) 108 (90 Base) MCG/ACT inhaler, INHALE 2 PUFFS BY MOUTH EVERY 4 HOURS AS NEEDED FOR WHEEZE OR FOR SHORTNESS OF BREATH, Disp: 18 each, Rfl: 1   ALPRAZolam  (XANAX ) 1 MG tablet, TAKE 1 TABLET BY MOUTH 3 TIMES A  DAY AS NEEDED FOR ANXIETY, Disp: 90 tablet, Rfl: 5   busPIRone  (BUSPAR ) 10 MG tablet, Take 1 tablet (10 mg total) by mouth 2 (two) times daily., Disp: 60 tablet, Rfl: 2   Cholecalciferol (VITAMIN D ) 50 MCG (2000 UT) CAPS, Take 2,000 Units by mouth daily., Disp: , Rfl:    clindamycin  (CLEOCIN  T) 1 % SWAB, APPLY TO AFFECTED AREA TWICE A DAY, Disp: 60 each, Rfl: 5   fluticasone  (FLONASE ) 50 MCG/ACT nasal spray, Place 2 sprays into both nostrils daily. (Patient taking differently: Place 2 sprays into both nostrils daily as needed for allergies.), Disp: 16 g, Rfl: 6   ibuprofen  (ADVIL ) 800 MG tablet, Take 1 tablet (800 mg total) by mouth every 8 (eight) hours as needed., Disp: 60 tablet, Rfl: 0   ketoconazole  (NIZORAL ) 2 % cream, APPLY TO AFFECTED AREA TWICE A DAY, Disp: 15 g, Rfl: 5   lisinopril -hydrochlorothiazide  (ZESTORETIC ) 10-12.5 MG tablet, TAKE 1 TABLET BY MOUTH EVERY DAY, Disp: 90 tablet, Rfl: 1   loratadine  (CLARITIN ) 10 MG tablet, Take 10 mg by mouth daily., Disp: , Rfl:    methylphenidate  (RITALIN ) 20 MG tablet, Take 1 tablet (20 mg total) by mouth 3 (three) times daily with meals., Disp:  90 tablet, Rfl: 0   methylphenidate  (RITALIN ) 20 MG tablet, Take 1 tablet (20 mg total) by mouth 3 (three) times daily with meals., Disp: 90 tablet, Rfl: 0   methylphenidate  (RITALIN ) 20 MG tablet, Take 1 tablet (20 mg total) by mouth 3 (three) times daily with meals., Disp: 90 tablet, Rfl: 0   norethindrone  (ORTHO MICRONOR ) 0.35 MG tablet, Take 1 tablet (0.35 mg total) by mouth daily., Disp: 1 Package, Rfl: 11   omeprazole  (PRILOSEC) 40 MG capsule, Take 1 capsule (40 mg total) by mouth 2 (two) times daily., Disp: 180 capsule, Rfl: 3   ondansetron  (ZOFRAN ) 4 MG tablet, Take 1 tablet (4 mg total) by mouth every 8 (eight) hours as needed for nausea or vomiting., Disp: 60 tablet, Rfl: 3   promethazine -dextromethorphan (PROMETHAZINE -DM) 6.25-15 MG/5ML syrup, Take 5 mLs by mouth 4 (four) times daily as needed for  cough., Disp: 118 mL, Rfl: 0   rizatriptan  (MAXALT ) 10 MG tablet, TAKE 1 TABLET BY MOUTH AS NEEDED FOR MIGRAINE. MAY REPEAT IN 2 HOURS IF NEEDED, Disp: 10 tablet, Rfl: 11   Spacer/Aero-Holding Chambers (OPTICHAMBER DIAMOND ) MISC, Use with inhaler, Disp: 1 each, Rfl: 1   terbinafine  (LAMISIL ) 250 MG tablet, TAKE 1 TABLET BY MOUTH EVERY DAY, Disp: 30 tablet, Rfl: 2   traMADol  (ULTRAM ) 50 MG tablet, TAKE 1 TABLET BY MOUTH EVERY 6 HOURS AS NEEDED FOR SEVERE PAIN, Disp: 20 tablet, Rfl: 5  EXAM:  VITALS per patient if applicable:  GENERAL: alert, oriented, appears well and in no acute distress  HEENT: atraumatic, conjunttiva clear, no obvious abnormalities on inspection of external nose and ears  NECK: normal movements of the head and neck  LUNGS: on inspection no signs of respiratory distress, breathing rate appears normal, no obvious gross SOB, gasping or wheezing  CV: no obvious cyanosis  MS: moves all visible extremities without noticeable abnormality  PSYCH/NEURO: pleasant and cooperative, no obvious depression or anxiety, speech and thought processing grossly intact  ASSESSMENT AND PLAN: Anxiety, we will increase the Busapr to 20 mg BID. Report back in 2 weeks.  Corita Diego, MD   Discussed the following assessment and plan:  No diagnosis found.     I discussed the assessment and treatment plan with the patient. The patient was provided an opportunity to ask questions and all were answered. The patient agreed with the plan and demonstrated an understanding of the instructions.   The patient was advised to call back or seek an in-person evaluation if the symptoms worsen or if the condition fails to improve as anticipated.      Review of Systems     Objective:   Physical Exam        Assessment & Plan:

## 2023-06-11 ENCOUNTER — Ambulatory Visit: Payer: Medicaid Other | Admitting: Family Medicine

## 2023-06-13 DIAGNOSIS — F4311 Post-traumatic stress disorder, acute: Secondary | ICD-10-CM | POA: Diagnosis not present

## 2023-06-13 DIAGNOSIS — F411 Generalized anxiety disorder: Secondary | ICD-10-CM | POA: Diagnosis not present

## 2023-06-21 DIAGNOSIS — Z419 Encounter for procedure for purposes other than remedying health state, unspecified: Secondary | ICD-10-CM | POA: Diagnosis not present

## 2023-06-23 ENCOUNTER — Telehealth: Payer: Self-pay | Admitting: Family Medicine

## 2023-06-23 NOTE — Telephone Encounter (Signed)
Pt states a new job that she is applying for is requiring a letter from her doctor certifying that she takes her medications as she should. She is requesting a call back once letter is ready for pick up. Advise at: [phone].

## 2023-06-23 NOTE — Telephone Encounter (Signed)
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 This is for her work screening. Please advise

## 2023-06-23 NOTE — Telephone Encounter (Signed)
 The letter is ready

## 2023-06-24 DIAGNOSIS — F4311 Post-traumatic stress disorder, acute: Secondary | ICD-10-CM | POA: Diagnosis not present

## 2023-06-24 DIAGNOSIS — F411 Generalized anxiety disorder: Secondary | ICD-10-CM | POA: Diagnosis not present

## 2023-06-25 ENCOUNTER — Other Ambulatory Visit: Payer: Self-pay | Admitting: Medical Genetics

## 2023-06-25 ENCOUNTER — Encounter (INDEPENDENT_AMBULATORY_CARE_PROVIDER_SITE_OTHER): Payer: Self-pay

## 2023-06-26 ENCOUNTER — Other Ambulatory Visit (HOSPITAL_COMMUNITY): Payer: Self-pay | Attending: Medical Genetics

## 2023-06-26 NOTE — Telephone Encounter (Signed)
 Left a message for pt to pick up letter at the front desk. Pt has a lab app today afternoon at 3.40 pm. Pt letter placed at the front office filing cabinet

## 2023-06-28 ENCOUNTER — Other Ambulatory Visit: Payer: Self-pay | Admitting: Family Medicine

## 2023-06-30 ENCOUNTER — Other Ambulatory Visit: Payer: Self-pay | Admitting: Family Medicine

## 2023-06-30 ENCOUNTER — Encounter: Payer: Self-pay | Admitting: Family Medicine

## 2023-07-03 ENCOUNTER — Encounter (INDEPENDENT_AMBULATORY_CARE_PROVIDER_SITE_OTHER): Payer: Self-pay

## 2023-07-03 DIAGNOSIS — K8010 Calculus of gallbladder with chronic cholecystitis without obstruction: Secondary | ICD-10-CM | POA: Diagnosis not present

## 2023-07-03 DIAGNOSIS — E66811 Obesity, class 1: Secondary | ICD-10-CM | POA: Diagnosis not present

## 2023-07-04 DIAGNOSIS — F4311 Post-traumatic stress disorder, acute: Secondary | ICD-10-CM | POA: Diagnosis not present

## 2023-07-04 DIAGNOSIS — F411 Generalized anxiety disorder: Secondary | ICD-10-CM | POA: Diagnosis not present

## 2023-07-07 NOTE — Telephone Encounter (Signed)
Increase the Buspar to 30 mg BID, call in #[phone] with 2 rf

## 2023-07-11 MED ORDER — BUSPIRONE HCL 30 MG PO TABS: 30.0000 mg | ORAL_TABLET | Freq: Two times a day (BID) | ORAL | 2 refills | Status: DC

## 2023-07-11 NOTE — Telephone Encounter (Signed)
Rx called in to pt pharmacy, pt notified.

## 2023-07-19 DIAGNOSIS — Z419 Encounter for procedure for purposes other than remedying health state, unspecified: Secondary | ICD-10-CM | POA: Diagnosis not present

## 2023-07-29 ENCOUNTER — Other Ambulatory Visit: Payer: Self-pay | Admitting: General Surgery

## 2023-07-29 DIAGNOSIS — K811 Chronic cholecystitis: Secondary | ICD-10-CM | POA: Diagnosis not present

## 2023-07-29 DIAGNOSIS — K8010 Calculus of gallbladder with chronic cholecystitis without obstruction: Secondary | ICD-10-CM | POA: Diagnosis not present

## 2023-07-29 HISTORY — PX: CHOLECYSTECTOMY: SHX55

## 2023-07-30 ENCOUNTER — Encounter: Payer: Self-pay | Admitting: Family Medicine

## 2023-07-30 ENCOUNTER — Other Ambulatory Visit: Payer: Self-pay | Admitting: Family Medicine

## 2023-07-30 MED ORDER — METHYLPHENIDATE HCL 20 MG PO TABS
20.0000 mg | ORAL_TABLET | Freq: Three times a day (TID) | ORAL | 0 refills | Status: DC
Start: 2023-07-30 — End: 2023-11-17

## 2023-07-30 MED ORDER — METHYLPHENIDATE HCL 20 MG PO TABS: 20.0000 mg | ORAL_TABLET | Freq: Three times a day (TID) | ORAL | 0 refills | Status: DC

## 2023-07-30 NOTE — Telephone Encounter (Signed)
 Done

## 2023-07-31 LAB — SURGICAL PATHOLOGY

## 2023-08-04 NOTE — Telephone Encounter (Signed)
 Noted.

## 2023-08-08 ENCOUNTER — Other Ambulatory Visit: Payer: Self-pay | Admitting: Family Medicine

## 2023-08-11 ENCOUNTER — Telehealth: Admitting: Physician Assistant

## 2023-08-11 ENCOUNTER — Encounter: Payer: Self-pay | Admitting: Family Medicine

## 2023-08-11 DIAGNOSIS — J069 Acute upper respiratory infection, unspecified: Secondary | ICD-10-CM

## 2023-08-11 DIAGNOSIS — B9689 Other specified bacterial agents as the cause of diseases classified elsewhere: Secondary | ICD-10-CM | POA: Diagnosis not present

## 2023-08-11 MED ORDER — BENZONATATE 100 MG PO CAPS: 100.0000 mg | ORAL_CAPSULE | Freq: Three times a day (TID) | ORAL | 0 refills | Status: DC | PRN

## 2023-08-11 MED ORDER — AMOXICILLIN-POT CLAVULANATE 875-125 MG PO TABS: 1.0000 | ORAL_TABLET | Freq: Two times a day (BID) | ORAL | 0 refills | Status: DC

## 2023-08-11 NOTE — Patient Instructions (Signed)
 Diana Nelson, thank you for joining Margaretann Loveless, PA-C for today's virtual visit.  While this provider is not your primary care provider (PCP), if your PCP is located in our provider database this encounter information will be shared with them immediately following your visit.   A [facility] MyChart account gives you access to today's visit and all your visits, tests, and labs performed at [facility] " click here if you don't have a [facility] MyChart account or go to [email or web address]  Consent: (Patient) Diana Nelson provided verbal consent for this virtual visit at the beginning of the encounter.  Current Medications:  Current Outpatient Medications:    amoxicillin-clavulanate (AUGMENTIN) 875-125 MG tablet, Take 1 tablet by mouth 2 (two) times daily., Disp: 20 tablet, Rfl: 0   benzonatate (TESSALON) 100 MG capsule, Take 1-2 capsules (100-200 mg total) by mouth 3 (three) times daily as needed., Disp: 30 capsule, Rfl: 0   albuterol (VENTOLIN HFA) 108 (90 Base) MCG/ACT inhaler, INHALE 2 PUFFS BY MOUTH EVERY 4 HOURS AS NEEDED FOR WHEEZE OR FOR SHORTNESS OF BREATH, Disp: 18 each, Rfl: 1   ALPRAZolam (XANAX) 1 MG tablet, TAKE 1 TABLET BY MOUTH 3 TIMES A DAY AS NEEDED FOR ANXIETY, Disp: 90 tablet, Rfl: 5   busPIRone (BUSPAR) 30 MG tablet, Take 1 tablet (30 mg total) by mouth in the morning and at bedtime., Disp: 60 tablet, Rfl: 2   Cholecalciferol (VITAMIN D) 50 MCG (2000 UT) CAPS, Take 2,000 Units by mouth daily., Disp: , Rfl:    clindamycin (CLEOCIN T) 1 % SWAB, APPLY TO AFFECTED AREA TWICE A DAY, Disp: 60 each, Rfl: 5   fluticasone (FLONASE) 50 MCG/ACT nasal spray, Place 2 sprays into both nostrils daily. (Patient taking differently: Place 2 sprays into both nostrils daily as needed for allergies.), Disp: 16 g, Rfl: 6   ibuprofen (ADVIL) 800 MG tablet, Take 1 tablet (800 mg total) by mouth every 8 (eight) hours as needed., Disp: 60 tablet, Rfl: 0   ketoconazole  (NIZORAL) 2 % cream, APPLY TO AFFECTED AREA TWICE A DAY, Disp: 15 g, Rfl: 5   lisinopril-hydrochlorothiazide (ZESTORETIC) 10-12.5 MG tablet, TAKE 1 TABLET BY MOUTH EVERY DAY, Disp: 90 tablet, Rfl: 1   loratadine (CLARITIN) 10 MG tablet, Take 10 mg by mouth daily., Disp: , Rfl:    methylphenidate (RITALIN) 20 MG tablet, Take 1 tablet (20 mg total) by mouth 3 (three) times daily with meals., Disp: 90 tablet, Rfl: 0   methylphenidate (RITALIN) 20 MG tablet, Take 1 tablet (20 mg total) by mouth 3 (three) times daily with meals., Disp: 90 tablet, Rfl: 0   methylphenidate (RITALIN) 20 MG tablet, Take 1 tablet (20 mg total) by mouth 3 (three) times daily with meals., Disp: 90 tablet, Rfl: 0   norethindrone (ORTHO MICRONOR) 0.35 MG tablet, Take 1 tablet (0.35 mg total) by mouth daily., Disp: 1 Package, Rfl: 11   omeprazole (PRILOSEC) 40 MG capsule, Take 1 capsule (40 mg total) by mouth 2 (two) times daily., Disp: 180 capsule, Rfl: 3   ondansetron (ZOFRAN) 4 MG tablet, TAKE 1 TABLET BY MOUTH EVERY 8 HOURS AS NEEDED FOR NAUSEA AND VOMITING, Disp: 60 tablet, Rfl: 1   promethazine-dextromethorphan (PROMETHAZINE-DM) 6.25-15 MG/5ML syrup, Take 5 mLs by mouth 4 (four) times daily as needed for cough., Disp: 118 mL, Rfl: 0   rizatriptan (MAXALT) 10 MG tablet, TAKE 1 TABLET BY MOUTH AS NEEDED FOR MIGRAINE. MAY REPEAT IN 2 HOURS IF NEEDED, Disp: 10  tablet, Rfl: 11   Spacer/Aero-Holding Chambers (OPTICHAMBER DIAMOND) MISC, Use with inhaler, Disp: 1 each, Rfl: 1   terbinafine (LAMISIL) 250 MG tablet, TAKE 1 TABLET BY MOUTH EVERY DAY, Disp: 30 tablet, Rfl: 2   traMADol (ULTRAM) 50 MG tablet, TAKE 1 TABLET BY MOUTH EVERY 6 HOURS AS NEEDED FOR SEVERE PAIN, Disp: 20 tablet, Rfl: 5   Medications ordered in this encounter:  Meds ordered this encounter  Medications   amoxicillin-clavulanate (AUGMENTIN) 875-125 MG tablet    Sig: Take 1 tablet by mouth 2 (two) times daily.    Dispense:  20 tablet    Refill:  0     Supervising Provider:   Merrilee Jansky [[id number]]   benzonatate (TESSALON) 100 MG capsule    Sig: Take 1-2 capsules (100-200 mg total) by mouth 3 (three) times daily as needed.    Dispense:  30 capsule    Refill:  0    Supervising Provider:   Merrilee Jansky [[id number]]     *If you need refills on other medications prior to your next appointment, please contact your pharmacy*  Follow-Up: Call back or seek an in-person evaluation if the symptoms worsen or if the condition fails to improve as anticipated.  [facility] [phone]  Other Instructions Upper Respiratory Infection, Adult An upper respiratory infection (URI) is a common viral infection of the nose, throat, and upper air passages that lead to the lungs. The most common type of URI is the common cold. URIs usually get better on their own, without medical treatment. What are the causes? A URI is caused by a virus. You may catch a virus by: Breathing in droplets from an infected person's cough or sneeze. Touching something that has been exposed to the virus (is contaminated) and then touching your mouth, nose, or eyes. What increases the risk? You are more likely to get a URI if: You are very young or very old. You have close contact with others, such as at work, school, or a health care facility. You smoke. You have long-term (chronic) heart or lung disease. You have a weakened disease-fighting system (immune system). You have nasal allergies or asthma. You are experiencing a lot of stress. You have poor nutrition. What are the signs or symptoms? A URI usually involves some of the following symptoms: Runny or stuffy (congested) nose. Cough. Sneezing. Sore throat. Headache. Fatigue. Fever. Loss of appetite. Pain in your forehead, behind your eyes, and over your cheekbones (sinus pain). Muscle aches. Redness or irritation of the eyes. Pressure in the ears or face. How is this diagnosed? This  condition may be diagnosed based on your medical history and symptoms, and a physical exam. Your health care provider may use a swab to take a mucus sample from your nose (nasal swab). This sample can be tested to determine what virus is causing the illness. How is this treated? URIs usually get better on their own within 7-10 days. Medicines cannot cure URIs, but your health care provider may recommend certain medicines to help relieve symptoms, such as: Over-the-counter cold medicines. Cough suppressants. Coughing is a type of defense against infection that helps to clear the respiratory system, so take these medicines only as recommended by your health care provider. Fever-reducing medicines. Follow these instructions at home: Activity Rest as needed. If you have a fever, stay home from work or school until your fever is gone or until your health care provider says your URI cannot spread to other  people (is no longer contagious). Your health care provider may have you wear a face mask to prevent your infection from spreading. Relieving symptoms Gargle with a mixture of salt and water 3-4 times a day or as needed. To make salt water, completely dissolve -1 tsp (3-6 g) of salt in 1 cup (237 mL) of warm water. Use a cool-mist humidifier to add moisture to the air. This can help you breathe more easily. Eating and drinking  Drink enough fluid to keep your urine pale yellow. Eat soups and other clear broths. General instructions  Take over-the-counter and prescription medicines only as told by your health care provider. These include cold medicines, fever reducers, and cough suppressants. Do not use any products that contain nicotine or tobacco. These products include cigarettes, chewing tobacco, and vaping devices, such as e-cigarettes. If you need help quitting, ask your health care provider. Stay away from secondhand smoke. Stay up to date on all immunizations, including the yearly (annual)  flu vaccine. Keep all follow-up visits. This is important. How to prevent the spread of infection to others URIs can be contagious. To prevent the infection from spreading: Wash your hands with soap and water for at least 20 seconds. If soap and water are not available, use hand sanitizer. Avoid touching your mouth, face, eyes, or nose. Cough or sneeze into a tissue or your sleeve or elbow instead of into your hand or into the air.  Contact a health care provider if: You are getting worse instead of better. You have a fever or chills. Your mucus is brown or red. You have yellow or brown discharge coming from your nose. You have pain in your face, especially when you bend forward. You have swollen neck glands. You have pain while swallowing. You have white areas in the back of your throat. Get help right away if: You have shortness of breath that gets worse. You have severe or persistent: Headache. Ear pain. Sinus pain. Chest pain. You have chronic lung disease along with any of the following: Making high-pitched whistling sounds when you breathe, most often when you breathe out (wheezing). Prolonged cough (more than 14 days). Coughing up blood. A change in your usual mucus. You have a stiff neck. You have changes in your: Vision. Hearing. Thinking. Mood. These symptoms may be an emergency. Get help right away. Call 911. Do not wait to see if the symptoms will go away. Do not drive yourself to the hospital. Summary An upper respiratory infection (URI) is a common infection of the nose, throat, and upper air passages that lead to the lungs. A URI is caused by a virus. URIs usually get better on their own within 7-10 days. Medicines cannot cure URIs, but your health care provider may recommend certain medicines to help relieve symptoms. This information is not intended to replace advice given to you by your health care provider. Make sure you discuss any questions you have  with your health care provider. Document Revised: 12/06/2020 Document Reviewed: 12/06/2020 Elsevier Patient Education  2024 [employer]   If you have been instructed to have an in-person evaluation today at a local Urgent Care facility, please use the link below. It will take you to a list of all of our available [facility], including address, phone number and hours of operation. Please do not delay care.  [facility]  If you or a family member do not have a primary care provider, use the link below to  schedule a visit and establish care. When you choose a [facility] primary care physician or advanced practice provider, you gain a long-term partner in health. Find a Primary Care Provider  Learn more about [facility]'s in-office and virtual care options: [facility] - Get Care Now

## 2023-08-11 NOTE — Progress Notes (Signed)
 Virtual Visit Consent   Diana Nelson, you are scheduled for a virtual visit with a [facility] provider today. Just as with appointments in the office, your consent must be obtained to participate. Your consent will be active for this visit and any virtual visit you may have with one of our providers in the next 365 days. If you have a MyChart account, a copy of this consent can be sent to you electronically.  As this is a virtual visit, video technology does not allow for your provider to perform a traditional examination. This may limit your provider's ability to fully assess your condition. If your provider identifies any concerns that need to be evaluated in person or the need to arrange testing (such as labs, EKG, etc.), we will make arrangements to do so. Although advances in technology are sophisticated, we cannot ensure that it will always work on either your end or our end. If the connection with a video visit is poor, the visit may have to be switched to a telephone visit. With either a video or telephone visit, we are not always able to ensure that we have a secure connection.  By engaging in this virtual visit, you consent to the provision of healthcare and authorize for your insurance to be billed (if applicable) for the services provided during this visit. Depending on your insurance coverage, you may receive a charge related to this service.  I need to obtain your verbal consent now. Are you willing to proceed with your visit today? Diana Nelson has provided verbal consent on 08/11/2023 for a virtual visit (video or telephone). Diana Loveless, PA-C  Date: 08/11/2023 10:09 AM   Virtual Visit via Video Note   I, Diana Nelson, connected with  Diana Nelson  ([id number], 01/04/1981) on 08/11/23 at 10:00 AM EDT by a video-enabled telemedicine application and verified that I am speaking with the correct person using two identifiers.  Location: Patient: Virtual Visit Location  Patient: Home Provider: Virtual Visit Location Provider: Home Office   I discussed the limitations of evaluation and management by telemedicine and the availability of in person appointments. The patient expressed understanding and agreed to proceed.    History of Present Illness: Diana Nelson is a 43 y.o. who identifies as a female who was assigned female at birth, and is being seen today for URI symptoms.  HPI: Sinusitis This is a new problem. The current episode started in the past 7 days. The problem has been gradually worsening since onset. There has been no fever. The pain is moderate. Associated symptoms include congestion, coughing (intermittent), ear pain (bilateral, shooting pains), headaches, sinus pressure and a sore throat. Pertinent negatives include no chills. (Eyes watering and matted, will have hot and cold flashes) Treatments tried: dayquil, nyquil, vicks, albuterol. The treatment provided no relief.    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   Migraine headache 09/24/2006    Allergies:  Allergies  Allergen Reactions   Bactrim [Sulfamethoxazole-Trimethoprim] Rash   Azithromycin Nausea Only   Lamictal [Lamotrigine] Rash   Medications:  Current Outpatient Medications:    amoxicillin-clavulanate (AUGMENTIN) 875-125 MG tablet, Take 1 tablet by mouth 2 (two) times daily., Disp: 20 tablet, Rfl: 0   benzonatate  (TESSALON) 100 MG capsule, Take 1-2 capsules (100-200 mg total) by mouth 3 (three) times daily as needed., Disp: 30 capsule, Rfl: 0   albuterol (VENTOLIN HFA) 108 (90 Base) MCG/ACT inhaler, INHALE 2 PUFFS BY MOUTH EVERY 4 HOURS AS NEEDED FOR WHEEZE OR FOR SHORTNESS OF BREATH, Disp: 18 each, Rfl: 1   ALPRAZolam (XANAX) 1 MG tablet, TAKE 1 TABLET BY MOUTH 3 TIMES A DAY AS NEEDED FOR ANXIETY, Disp: 90 tablet, Rfl: 5   busPIRone (BUSPAR) 30 MG tablet, Take 1 tablet (30 mg total) by mouth in the morning and at bedtime., Disp: 60 tablet, Rfl: 2   Cholecalciferol (VITAMIN D) 50 MCG (2000 UT) CAPS, Take 2,000 Units by mouth daily., Disp: , Rfl:    clindamycin (CLEOCIN T) 1 % SWAB, APPLY TO AFFECTED AREA TWICE A DAY, Disp: 60 each, Rfl: 5   fluticasone (FLONASE) 50 MCG/ACT nasal spray, Place 2 sprays into both nostrils daily. (Patient taking differently: Place 2 sprays into both nostrils daily as needed for allergies.), Disp: 16 g, Rfl: 6   ibuprofen (ADVIL) 800 MG tablet, Take 1 tablet (800 mg total) by mouth every 8 (eight) hours as needed., Disp: 60 tablet, Rfl: 0   ketoconazole (NIZORAL) 2 % cream, APPLY TO AFFECTED AREA TWICE A DAY, Disp: 15 g, Rfl: 5   lisinopril-hydrochlorothiazide (ZESTORETIC) 10-12.5 MG tablet, TAKE 1 TABLET BY MOUTH EVERY DAY, Disp: 90 tablet, Rfl: 1   loratadine (CLARITIN) 10 MG tablet, Take 10 mg by mouth daily., Disp: , Rfl:    methylphenidate (RITALIN) 20 MG tablet, Take 1 tablet (20 mg total) by mouth 3 (three) times daily with meals., Disp: 90 tablet, Rfl: 0   methylphenidate (RITALIN) 20 MG tablet, Take 1 tablet (20 mg total) by mouth 3 (three) times daily with meals., Disp: 90 tablet, Rfl: 0   methylphenidate (RITALIN) 20 MG tablet, Take 1 tablet (20 mg total) by mouth 3 (three) times daily with meals., Disp: 90 tablet, Rfl: 0   norethindrone (ORTHO MICRONOR) 0.35 MG tablet, Take 1 tablet (0.35 mg total) by mouth daily., Disp: 1 Package, Rfl: 11   omeprazole (PRILOSEC) 40 MG  capsule, Take 1 capsule (40 mg total) by mouth 2 (two) times daily., Disp: 180 capsule, Rfl: 3   ondansetron (ZOFRAN) 4 MG tablet, TAKE 1 TABLET BY MOUTH EVERY 8 HOURS AS NEEDED FOR NAUSEA AND VOMITING, Disp: 60 tablet, Rfl: 1   promethazine-dextromethorphan (PROMETHAZINE-DM) 6.25-15 MG/5ML syrup, Take 5 mLs by mouth 4 (four) times daily as needed for cough., Disp: 118 mL, Rfl: 0   rizatriptan (MAXALT) 10 MG tablet, TAKE 1 TABLET BY MOUTH AS NEEDED FOR MIGRAINE. MAY REPEAT IN 2 HOURS IF NEEDED, Disp: 10 tablet, Rfl: 11   Spacer/Aero-Holding Chambers (OPTICHAMBER DIAMOND) MISC, Use with inhaler, Disp: 1 each, Rfl: 1   terbinafine (LAMISIL) 250 MG tablet, TAKE 1 TABLET BY MOUTH EVERY DAY, Disp: 30 tablet, Rfl: 2   traMADol (ULTRAM) 50 MG tablet, TAKE 1 TABLET BY MOUTH EVERY 6 HOURS AS NEEDED FOR SEVERE PAIN, Disp: 20 tablet, Rfl: 5  Observations/Objective: Patient is well-developed, well-nourished in no acute distress.  Resting comfortably at home.  Head is normocephalic, atraumatic.  No labored breathing.  Speech is clear and coherent with logical content.  Patient is alert and oriented at baseline.    Assessment and Plan: 1. Bacterial upper respiratory infection (Primary) - amoxicillin-clavulanate (AUGMENTIN) 875-125 MG tablet; Take 1 tablet by mouth 2 (two) times daily.  Dispense: 20 tablet; Refill: 0 - benzonatate (TESSALON) 100 MG capsule; Take 1-2 capsules (100-200 mg total) by mouth 3 (three) times daily as needed.  Dispense: 30 capsule; Refill: 0  - Worsening over a week despite OTC medications - Will treat with Augmentin and tessalon perles - Can continue Mucinex  - Push fluids.  - Rest.  - Steam and humidifier can help - Seek in person evaluation if worsening or symptoms fail to improve    Follow Up Instructions: I discussed the assessment and treatment plan with the patient. The patient was provided an opportunity to ask questions and all were answered. The patient agreed  with the plan and demonstrated an understanding of the instructions.  A copy of instructions were sent to the patient via MyChart unless otherwise noted below.    The patient was advised to call back or seek an in-person evaluation if the symptoms worsen or if the condition fails to improve as anticipated.    Diana Loveless, PA-C

## 2023-08-12 ENCOUNTER — Other Ambulatory Visit: Payer: Self-pay | Admitting: Family Medicine

## 2023-08-12 ENCOUNTER — Telehealth: Payer: Self-pay | Admitting: Family Medicine

## 2023-08-12 DIAGNOSIS — R051 Acute cough: Secondary | ICD-10-CM

## 2023-08-12 MED ORDER — PROMETHAZINE-DM 6.25-15 MG/5ML PO SYRP: 5.0000 mL | ORAL_SOLUTION | Freq: Four times a day (QID) | ORAL | 0 refills | Status: DC | PRN

## 2023-08-12 NOTE — Telephone Encounter (Signed)
 Pt states she had a telehealth sppt yesterday through a [facility] provider.  They prescribed her some Tessalon Pearls, however she states they are not helping and she is still coughing non-stop.  She is requesting a cough syrup to be called in to her pharmacy.  Please let pt know.  Pharmacy- [facility] [address]

## 2023-08-12 NOTE — Progress Notes (Signed)
 Patient had a telehealth visit with Joycelyn Man, PA. She was prescribed Benzonatate, but patient has messaged that this is not helping with her cough. She is requesting cough syrup.

## 2023-08-12 NOTE — Telephone Encounter (Signed)
 Pt PCP is not available, please advise

## 2023-08-20 ENCOUNTER — Encounter: Payer: Self-pay | Admitting: Family Medicine

## 2023-08-20 ENCOUNTER — Ambulatory Visit: Admitting: Family Medicine

## 2023-08-20 VITALS — BP 110/78 | HR 110 | Temp 98.5°F | Wt 188.0 lb

## 2023-08-20 DIAGNOSIS — R051 Acute cough: Secondary | ICD-10-CM | POA: Diagnosis not present

## 2023-08-20 DIAGNOSIS — F418 Other specified anxiety disorders: Secondary | ICD-10-CM

## 2023-08-20 DIAGNOSIS — J40 Bronchitis, not specified as acute or chronic: Secondary | ICD-10-CM

## 2023-08-20 MED ORDER — METHYLPREDNISOLONE 4 MG PO TBPK: ORAL_TABLET | ORAL | 0 refills | Status: DC

## 2023-08-20 MED ORDER — ALPRAZOLAM 1 MG PO TABS: 1.0000 mg | ORAL_TABLET | Freq: Three times a day (TID) | ORAL | 5 refills | Status: DC | PRN

## 2023-08-20 MED ORDER — PROMETHAZINE-DM 6.25-15 MG/5ML PO SYRP: 5.0000 mL | ORAL_SOLUTION | Freq: Four times a day (QID) | ORAL | 0 refills | Status: DC | PRN

## 2023-08-20 MED ORDER — BUSPIRONE HCL 15 MG PO TABS: 45.0000 mg | ORAL_TABLET | Freq: Two times a day (BID) | ORAL | 5 refills | Status: DC

## 2023-08-20 NOTE — Progress Notes (Signed)
   Subjective:    Patient ID: Diana Nelson, female    DOB: 1980/09/12, 43 y.o.   MRN: [id number]  HPI Here for several issues. First she had a telehealth visit on 08-11-23 for an upper respiratory infection, and she was given 10 days of Augmentin and Benzonatate. She now feels better in general, but she still has a dry cough and some chest congestion. No fever. The other issue is her anxiety. She has been taking Buspar 30 mg BID, and says this has helped her more than any other medication she has tried. However she asks to increase the dose if possible.    Review of Systems  Constitutional: Negative.   HENT: Negative.    Eyes: Negative.   Respiratory:  Positive for cough and wheezing. Negative for shortness of breath.   Psychiatric/Behavioral:  Negative for agitation, behavioral problems, confusion, decreased concentration, dysphoric mood, hallucinations and sleep disturbance. The patient is nervous/anxious.        Objective:   Physical Exam Constitutional:      Appearance: Normal appearance.  Cardiovascular:     Rate and Rhythm: Normal rate and regular rhythm.     Pulses: Normal pulses.     Heart sounds: Normal heart sounds.  Pulmonary:     Effort: Pulmonary effort is normal.     Breath sounds: Rhonchi present. No wheezing or rales.  Neurological:     Mental Status: She is alert and oriented to person, place, and time.  Psychiatric:        Mood and Affect: Mood normal.        Behavior: Behavior normal.        Thought Content: Thought content normal.           Assessment & Plan:  She is getting over a bronchitis, and she has some lingering congestion. We will treat this with a Medrol dose pack. For the anxiety, we will increase the Buspar to 45 mg BID.  Gershon Crane, MD

## 2023-08-30 DIAGNOSIS — Z419 Encounter for procedure for purposes other than remedying health state, unspecified: Secondary | ICD-10-CM | POA: Diagnosis not present

## 2023-08-31 ENCOUNTER — Other Ambulatory Visit: Payer: Self-pay | Admitting: Family Medicine

## 2023-08-31 DIAGNOSIS — R051 Acute cough: Secondary | ICD-10-CM

## 2023-09-09 ENCOUNTER — Other Ambulatory Visit: Payer: Self-pay | Admitting: Family Medicine

## 2023-09-15 ENCOUNTER — Other Ambulatory Visit: Payer: Self-pay | Admitting: Family Medicine

## 2023-09-16 ENCOUNTER — Other Ambulatory Visit: Payer: Self-pay | Admitting: Family Medicine

## 2023-09-29 DIAGNOSIS — Z419 Encounter for procedure for purposes other than remedying health state, unspecified: Secondary | ICD-10-CM | POA: Diagnosis not present

## 2023-10-19 ENCOUNTER — Telehealth

## 2023-10-19 ENCOUNTER — Telehealth: Admitting: Nurse Practitioner

## 2023-10-21 ENCOUNTER — Encounter: Payer: Self-pay | Admitting: Family Medicine

## 2023-10-23 NOTE — Telephone Encounter (Signed)
 No referral is needed, but I suggest she contact  Behavioral Medicine at [phone]

## 2023-10-30 DIAGNOSIS — Z419 Encounter for procedure for purposes other than remedying health state, unspecified: Secondary | ICD-10-CM | POA: Diagnosis not present

## 2023-11-11 DIAGNOSIS — F4311 Post-traumatic stress disorder, acute: Secondary | ICD-10-CM | POA: Diagnosis not present

## 2023-11-17 ENCOUNTER — Telehealth: Payer: Self-pay | Admitting: Family Medicine

## 2023-11-17 ENCOUNTER — Ambulatory Visit: Admitting: Family Medicine

## 2023-11-17 ENCOUNTER — Encounter: Payer: Self-pay | Admitting: Family Medicine

## 2023-11-17 MED ORDER — METHYLPHENIDATE HCL 20 MG PO TABS: 20.0000 mg | ORAL_TABLET | Freq: Three times a day (TID) | ORAL | 0 refills | Status: DC

## 2023-11-17 NOTE — Telephone Encounter (Signed)
 Pt notified to pick up Rx from pharmacy

## 2023-11-17 NOTE — Telephone Encounter (Signed)
 Patient came in and would like a refill on:  methylphenidate  (RITALIN ) 20 MG tablet   [id number] , [place] - [address] AT [facility] [address] [facility] [address] Phone: [phone]  Fax: [phone]      Please advise at [phone]

## 2023-11-17 NOTE — Telephone Encounter (Signed)
 Pt LOV was 08/20/23 Last refill was done on 07/30/23 Please advise

## 2023-11-17 NOTE — Telephone Encounter (Signed)
 Done

## 2023-11-29 ENCOUNTER — Other Ambulatory Visit: Payer: Self-pay | Admitting: Family Medicine

## 2023-11-29 DIAGNOSIS — K219 Gastro-esophageal reflux disease without esophagitis: Secondary | ICD-10-CM

## 2023-11-29 DIAGNOSIS — Z419 Encounter for procedure for purposes other than remedying health state, unspecified: Secondary | ICD-10-CM | POA: Diagnosis not present

## 2023-12-05 DIAGNOSIS — F411 Generalized anxiety disorder: Secondary | ICD-10-CM | POA: Diagnosis not present

## 2023-12-05 DIAGNOSIS — F4311 Post-traumatic stress disorder, acute: Secondary | ICD-10-CM | POA: Diagnosis not present

## 2023-12-30 DIAGNOSIS — Z419 Encounter for procedure for purposes other than remedying health state, unspecified: Secondary | ICD-10-CM | POA: Diagnosis not present

## 2024-01-05 DIAGNOSIS — F4311 Post-traumatic stress disorder, acute: Secondary | ICD-10-CM | POA: Diagnosis not present

## 2024-01-05 DIAGNOSIS — F411 Generalized anxiety disorder: Secondary | ICD-10-CM | POA: Diagnosis not present

## 2024-01-12 ENCOUNTER — Other Ambulatory Visit: Payer: Self-pay | Admitting: Family Medicine

## 2024-01-30 DIAGNOSIS — Z419 Encounter for procedure for purposes other than remedying health state, unspecified: Secondary | ICD-10-CM | POA: Diagnosis not present

## 2024-02-11 ENCOUNTER — Encounter: Payer: Self-pay | Admitting: Family Medicine

## 2024-02-11 ENCOUNTER — Ambulatory Visit (INDEPENDENT_AMBULATORY_CARE_PROVIDER_SITE_OTHER): Admitting: Family Medicine

## 2024-02-11 ENCOUNTER — Other Ambulatory Visit: Payer: Self-pay | Admitting: Family Medicine

## 2024-02-11 VITALS — BP 110/72 | HR 87 | Temp 97.9°F | Wt 196.0 lb

## 2024-02-11 DIAGNOSIS — G8929 Other chronic pain: Secondary | ICD-10-CM

## 2024-02-11 DIAGNOSIS — I10 Essential (primary) hypertension: Secondary | ICD-10-CM | POA: Diagnosis not present

## 2024-02-11 DIAGNOSIS — F900 Attention-deficit hyperactivity disorder, predominantly inattentive type: Secondary | ICD-10-CM | POA: Diagnosis not present

## 2024-02-11 DIAGNOSIS — F418 Other specified anxiety disorders: Secondary | ICD-10-CM

## 2024-02-11 DIAGNOSIS — M25551 Pain in right hip: Secondary | ICD-10-CM

## 2024-02-11 DIAGNOSIS — G4700 Insomnia, unspecified: Secondary | ICD-10-CM | POA: Diagnosis not present

## 2024-02-11 MED ORDER — METHYLPHENIDATE HCL 20 MG PO TABS: 20.0000 mg | ORAL_TABLET | Freq: Three times a day (TID) | ORAL | 0 refills | Status: DC

## 2024-02-11 MED ORDER — ONDANSETRON HCL 4 MG PO TABS: 4.0000 mg | ORAL_TABLET | Freq: Three times a day (TID) | ORAL | 5 refills | Status: AC | PRN

## 2024-02-11 MED ORDER — TRAZODONE HCL 50 MG PO TABS: 50.0000 mg | ORAL_TABLET | Freq: Every day | ORAL | 2 refills | Status: DC

## 2024-02-11 MED ORDER — LISINOPRIL-HYDROCHLOROTHIAZIDE 10-12.5 MG PO TABS: 1.0000 | ORAL_TABLET | Freq: Every day | ORAL | 3 refills | Status: AC

## 2024-02-11 MED ORDER — ALPRAZOLAM 1 MG PO TABS: 1.0000 mg | ORAL_TABLET | Freq: Three times a day (TID) | ORAL | 5 refills | Status: AC | PRN

## 2024-02-11 MED ORDER — BUSPIRONE HCL 15 MG PO TABS: 45.0000 mg | ORAL_TABLET | Freq: Two times a day (BID) | ORAL | 5 refills | Status: AC

## 2024-02-11 NOTE — Progress Notes (Signed)
   Subjective:    Patient ID: Diana Nelson, female    DOB: [birth year], 43 y.o.   MRN: [id number]  HPI Here for several issues. First she needs refills on most of her meds. Her HTN and ADHD have been stable. Her depression with anxiety has been well controlled.  She is seeing a therapist.  She does complain of trouble sleeping. She falls asleep quickly but she wakes up during the night. She also complains of a sharp pain in the right hip that started about 2 months ago. No recent trauma. The pain begins in the right buttock and radiates down the lateral thigh to the knee. No numbness or weakness in the leg.   Review of Systems  Constitutional: Negative.   Respiratory: Negative.    Cardiovascular: Negative.   Musculoskeletal:  Positive for arthralgias.  Psychiatric/Behavioral:  Positive for sleep disturbance. Negative for agitation, behavioral problems, confusion and dysphoric mood. The patient is not nervous/anxious.        Objective:   Physical Exam Constitutional:      Appearance: Normal appearance.  Cardiovascular:     Rate and Rhythm: Normal rate and regular rhythm.     Pulses: Normal pulses.     Heart sounds: Normal heart sounds.  Pulmonary:     Effort: Pulmonary effort is normal.     Breath sounds: Normal breath sounds.  Musculoskeletal:     Comments: She is tender over the right lateral hip area. No tenderness over the lower spine or the right sciatic notch. ROM of the spine is full   Neurological:     General: No focal deficit present.     Mental Status: She is alert and oriented to person, place, and time. Mental status is at baseline.           Assessment & Plan:  Her right hip pain is consistent with trochanteric bursitis. She can apply ice and take Ibuprofen  as needed. Refer to Orthopedics. Her HTN and ADHD are stable. Her depression with anxiety is stable. For the insomnia, she will try Trazodone  50 mg at bedtime.  Garnette Olmsted, MD

## 2024-03-05 ENCOUNTER — Other Ambulatory Visit: Payer: Self-pay | Admitting: Family Medicine

## 2024-03-19 ENCOUNTER — Other Ambulatory Visit: Payer: Self-pay | Admitting: Medical Genetics

## 2024-03-19 DIAGNOSIS — Z006 Encounter for examination for normal comparison and control in clinical research program: Secondary | ICD-10-CM

## 2024-03-22 ENCOUNTER — Encounter: Payer: Self-pay | Admitting: Radiology

## 2024-04-16 ENCOUNTER — Other Ambulatory Visit: Payer: Self-pay | Admitting: Family Medicine

## 2024-04-26 ENCOUNTER — Telehealth: Payer: Self-pay | Admitting: Family Medicine

## 2024-04-26 MED ORDER — TRAZODONE HCL 100 MG PO TABS: 100.0000 mg | ORAL_TABLET | Freq: Every day | ORAL | 3 refills | Status: DC

## 2024-04-26 NOTE — Telephone Encounter (Unsigned)
 Copied from CRM #[id number]. Topic: Clinical - Medication Refill >> Apr 26, 2024  1:04 PM Diana Nelson wrote: Medication: traZODone  (DESYREL ) 50 MG tablet  Has the patient contacted their pharmacy? Yes (Agent: If no, request that the patient contact the pharmacy for the refill. If patient does not wish to contact the pharmacy document the reason why and proceed with request.) (Agent: If yes, when and what did the pharmacy advise?)  This is the patient's preferred pharmacy:  [id number] [place] [place] - [address] AT CORNER OF [facility] [address] [place] [address] Phone: [phone] Fax: [phone]   Is this the correct pharmacy for this prescription? Yes If no, delete pharmacy and type the correct one.   Has the prescription been filled recently? Yes  Is the patient out of the medication? Yes  Has the patient been seen for an appointment in the last year OR does the patient have an upcoming appointment? Yes  Can we respond through MyChart? Yes  Agent: Please be advised that Rx refills may take up to 3 business days. We ask that you follow-up with your pharmacy.

## 2024-04-26 NOTE — Telephone Encounter (Signed)
 Done

## 2024-05-14 ENCOUNTER — Ambulatory Visit: Payer: Self-pay

## 2024-05-14 ENCOUNTER — Telehealth: Admitting: Family Medicine

## 2024-05-14 ENCOUNTER — Encounter: Payer: Self-pay | Admitting: Family Medicine

## 2024-05-14 ENCOUNTER — Other Ambulatory Visit: Payer: Self-pay | Admitting: Family Medicine

## 2024-05-14 VITALS — Wt 190.0 lb

## 2024-05-14 DIAGNOSIS — R6889 Other general symptoms and signs: Secondary | ICD-10-CM | POA: Diagnosis not present

## 2024-05-14 DIAGNOSIS — R051 Acute cough: Secondary | ICD-10-CM | POA: Diagnosis not present

## 2024-05-14 DIAGNOSIS — J4531 Mild persistent asthma with (acute) exacerbation: Secondary | ICD-10-CM | POA: Diagnosis not present

## 2024-05-14 MED ORDER — PROMETHAZINE-DM 6.25-15 MG/5ML PO SYRP: 5.0000 mL | ORAL_SOLUTION | Freq: Four times a day (QID) | ORAL | 0 refills | Status: AC | PRN

## 2024-05-14 MED ORDER — METHYLPREDNISOLONE 4 MG PO TBPK: ORAL_TABLET | ORAL | 0 refills | Status: AC

## 2024-05-14 MED ORDER — OSELTAMIVIR PHOSPHATE 75 MG PO CAPS: 75.0000 mg | ORAL_CAPSULE | Freq: Two times a day (BID) | ORAL | 0 refills | Status: AC

## 2024-05-14 NOTE — Progress Notes (Signed)
 "  Virtual Medical Office Visit  Patient:  Diana Nelson      Age: 43 y.o.       Sex:  female  Date:   05/14/2024  PCP:    Johnny Garnette LABOR, MD   Today's Healthcare Provider: Heron CHRISTELLA Sharper, MD    Assessment/Plan:   Summary assessment:  Lilyannah was seen today for sore throat, cough, generalized body aches and fatigue.  Mild persistent asthma with exacerbation -     methylPREDNISolone ; Take package as directed.  Dispense: 21 each; Refill: 0  Flu-like symptoms -     Oseltamivir  Phosphate; Take 1 capsule (75 mg total) by mouth 2 (two) times daily.  Dispense: 10 capsule; Refill: 0  Acute cough -     Promethazine -DM; Take 5 mLs by mouth 4 (four) times daily as needed.  Dispense: 240 mL; Refill: 0   Assessment and Plan    Mild persistent asthma with acute exacerbation Acute exacerbation likely triggered by current viral illness, presenting with wheezing and cough. Albuterol  inhaler available for use. - Prescribed Medrol  Dosepak to reduce airway inflammation and alleviate symptoms. - Ensure albuterol  inhaler is used as needed for wheezing.  Influenza-like illness Symptoms consistent with influenza-like illness, including sore throat, cough, fatigue, body aches, and sinus congestion. Family members also symptomatic, suggesting viral etiology. Differential includes influenza and COVID-19. Discussed potential benefit of Tamiflu  if influenza is confirmed. Advised to remain home from work to prevent spread, as contagiousness peaks within the first three days and persists for five to seven days. - Recommended over-the-counter COVID-19 and flu test to confirm diagnosis. - Prescribed Tamiflu , to be filled if flu test is positive. - Advised to remain home from work until symptoms improve, at least through the weekend. - Refilled cough syrup for symptomatic relief.        No follow-ups on file.   She was advised to call the office or go to ER if her condition worsens    Subjective:    Diana Nelson is a 43 y.o. 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  History of Present Illness   Diana Nelson is a 43 year old female with asthma who presents with flu-like symptoms and wheezing.  For 2 days she has had sore throat with odynophagia, cough, fatigue, myalgias, arthralgias, and sinus congestion. Her husband and children have similar symptoms. She denies chest pain or significant dyspnea. She has asthma and notes wheezing with coughing episodes. She uses an albuterol  inhaler. She is on day 2 of illness and has not been tested  for influenza or COVID-19.       She denies having any: Chest pain          Objective/Observations  Physical Exam:  Polite and friendly Gen: NAD, resting comfortably Pulm: Normal work of breathing Neuro: Grossly normal, moves all extremities Psych: Normal affect and thought content Problem specific physical exam findings:  N/A  No images are attached to the encounter or orders placed in the encounter.    Results: No results found for any visits on 05/14/24.   No results found for this or any previous visit (from the past 2160 hours).         Virtual Visit via Video   I connected with Stepahnie Campo Kabler on 05/14/2024 at  4:00  PM EST by a video enabled telemedicine application and verified that I am speaking with the correct person using two identifiers. The limitations of evaluation and management by telemedicine and the availability of in person appointments were discussed. The patient expressed understanding and agreed to proceed.   Percentage of appointment time on video:  100% Patient location: Home Provider location:  [facility] Persons participating in the virtual visit: Myself and Patient     "

## 2024-05-14 NOTE — Telephone Encounter (Signed)
 Copied from CRM #[id number]. Topic: Clinical - Medication Refill >> May 14, 2024 10:02 AM Rea ORN wrote: Medication:  methylphenidate  (RITALIN ) 20 MG tablet   Has the patient contacted their pharmacy? No (Agent: If no, request that the patient contact the pharmacy for the refill. If patient does not wish to contact the pharmacy document the reason why and proceed with request.) (Agent: If yes, when and what did the pharmacy advise?)  This is the patient's preferred pharmacy:  [id number] [place], [place] - [address] [place] [address] Phone: [phone] Fax: [phone]  Is this the correct pharmacy for this prescription? Yes If no, delete pharmacy and type the correct one.   Has the prescription been filled recently? No  Is the patient out of the medication? No  Has the patient been seen for an appointment in the last year OR does the patient have an upcoming appointment? Yes  Can we respond through MyChart? No  Agent: Please be advised that Rx refills may take up to 3 business days. We ask that you follow-up with your pharmacy.

## 2024-05-14 NOTE — Telephone Encounter (Signed)
 Video visit scheduled with Dr Ozell today at 4pm.

## 2024-05-14 NOTE — Telephone Encounter (Signed)
" ° °  FYI Only or Action Required?: FYI only for provider: appointment scheduled on 12.26.25.  Patient was last seen in primary care on 02/11/2024 by Johnny Garnette LABOR, MD.  Called Nurse Triage reporting Sore Throat.  Symptoms began yesterday.  Interventions attempted: OTC medications: Goodie Powder, Dayquil, Nyquil,, Motrin .  Symptoms are: gradually worsening.  Triage Disposition: See Physician Within 24 Hours  Patient/caregiver understands and will follow disposition?: Yes  Copied from CRM [id number]. Topic: Clinical - Red Word Triage >> May 14, 2024 10:04 AM Rea ORN wrote: Red Word that prompted transfer to Nurse Triage: sore throat, body aches, cough, thick mucus Reason for Disposition  SEVERE throat pain (e.g., excruciating)  Answer Assessment - Initial Assessment Questions 1. ONSET: When did the throat start hurting? (Hours or days ago)      Yesterday  2. SEVERITY: How bad is the sore throat? (Scale 1-10; mild, moderate or severe)      Worse since onset  3. STREP EXPOSURE: Has there been any exposure to strep within the past week? If Yes, ask: What type of contact occurred?       Denies  4.  VIRAL SYMPTOMS: Pt reports runny nose, cough,        5. FEVER: Do you have a fever? If Yes, ask: What is your temperature, how was it measured, and when did it start?      denies  6. PUS ON THE TONSILS: Is there pus on the tonsils in the back of your throat?      denies  7. OTHER SYMPTOMS:       Difficulty breathing due to congestion and headaches   Pt reports cough, congestion, sore throat, and body aches Pt is taking OTC Motrin , nyquil, dayquil, and Goodie powders Pt scheduled for a virtual visit on 12.26.25  for further evaluation. Pt agrees with plan of care, will call back for any worsening symptoms  Protocols used: Sore Throat-A-AH  "

## 2024-05-17 ENCOUNTER — Encounter: Payer: Self-pay | Admitting: Family Medicine

## 2024-05-18 ENCOUNTER — Other Ambulatory Visit: Payer: Self-pay | Admitting: Family Medicine

## 2024-05-18 MED ORDER — METHYLPHENIDATE HCL 20 MG PO TABS: 20.0000 mg | ORAL_TABLET | Freq: Three times a day (TID) | ORAL | 0 refills | Status: AC

## 2024-05-18 NOTE — Telephone Encounter (Signed)
 Done

## 2024-05-18 NOTE — Telephone Encounter (Unsigned)
 Copied from CRM #[id number]. Topic: Clinical - Medication Refill >> May 18, 2024  9:41 AM Avram MATSU wrote: Medication: methylphenidate  (RITALIN ) 20 MG tablet [[id number]]  Has the patient contacted their pharmacy? Yes (Agent: If no, request that the patient contact the pharmacy for the refill. If patient does not wish to contact the pharmacy document the reason why and proceed with request.) (Agent: If yes, when and what did the pharmacy advise?)  This is the patient's preferred pharmacy:  [id number] [place], [place] - [address] [place] [address] Phone: [phone] Fax: [phone]  Is this the correct pharmacy for this prescription? Yes If no, delete pharmacy and type the correct one.   Has the prescription been filled recently? No  Is the patient out of the medication? Yes  Has the patient been seen for an appointment in the last year OR does the patient have an upcoming appointment? Yes  Can we respond through MyChart? Yes  Agent: Please be advised that Rx refills may take up to 3 business days. We ask that you follow-up with your pharmacy.

## 2024-05-19 NOTE — Telephone Encounter (Signed)
 No, she already has refills until March 2026

## 2024-06-15 ENCOUNTER — Other Ambulatory Visit: Payer: Self-pay | Admitting: Medical Genetics

## 2024-06-15 DIAGNOSIS — Z006 Encounter for examination for normal comparison and control in clinical research program: Secondary | ICD-10-CM

## 2024-06-15 NOTE — Addendum Note (Signed)
 Addended by: REMUS NOEMI PARAS on: 06/15/2024 04:49 PM   Modules accepted: Orders

## 2024-06-17 ENCOUNTER — Encounter: Payer: Self-pay | Admitting: Family Medicine

## 2024-06-18 MED ORDER — TRAZODONE HCL 150 MG PO TABS: 150.0000 mg | ORAL_TABLET | Freq: Every day | ORAL | 1 refills | Status: AC

## 2024-06-18 NOTE — Telephone Encounter (Signed)
 The 150 mg dose would be fine to keep taking. In fact I sent in a new RX for the 150 mg tablets
